# Patient Record
Sex: Female | Born: 1935 | Race: White | Hispanic: No | Smoking: Former smoker
Health system: Southern US, Community
[De-identification: ages and names within clinical notes are randomized; demographics above are authoritative.]

## PROBLEM LIST (undated history)

## (undated) DIAGNOSIS — I82409 Acute embolism and thrombosis of unspecified deep veins of unspecified lower extremity: Secondary | ICD-10-CM

## (undated) HISTORY — DX: Acute embolism and thrombosis of unspecified deep veins of unspecified lower extremity: I82.409

## (undated) HISTORY — PX: THROAT SURGERY: SHX803

---

## 1998-01-06 ENCOUNTER — Ambulatory Visit (HOSPITAL_COMMUNITY): Admission: RE | Admit: 1998-01-06 | Discharge: 1998-01-06 | Payer: Self-pay | Admitting: Internal Medicine

## 1999-01-20 ENCOUNTER — Encounter: Payer: Self-pay | Admitting: Internal Medicine

## 1999-01-20 ENCOUNTER — Ambulatory Visit (HOSPITAL_COMMUNITY): Admission: RE | Admit: 1999-01-20 | Discharge: 1999-01-20 | Payer: Self-pay | Admitting: Internal Medicine

## 2006-05-09 ENCOUNTER — Encounter: Admission: RE | Admit: 2006-05-09 | Discharge: 2006-05-09 | Payer: Self-pay | Admitting: Family Medicine

## 2006-05-17 ENCOUNTER — Encounter: Admission: RE | Admit: 2006-05-17 | Discharge: 2006-05-17 | Payer: Self-pay | Admitting: Family Medicine

## 2006-07-18 ENCOUNTER — Encounter: Admission: RE | Admit: 2006-07-18 | Discharge: 2006-07-18 | Payer: Self-pay | Admitting: Endocrinology

## 2006-07-22 ENCOUNTER — Encounter: Admission: RE | Admit: 2006-07-22 | Discharge: 2006-07-22 | Payer: Self-pay | Admitting: Endocrinology

## 2007-05-11 ENCOUNTER — Other Ambulatory Visit: Admission: RE | Admit: 2007-05-11 | Discharge: 2007-05-11 | Payer: Self-pay | Admitting: Family Medicine

## 2009-05-14 ENCOUNTER — Other Ambulatory Visit: Admission: RE | Admit: 2009-05-14 | Discharge: 2009-05-14 | Payer: Self-pay | Admitting: Family Medicine

## 2015-08-06 DIAGNOSIS — Z0000 Encounter for general adult medical examination without abnormal findings: Secondary | ICD-10-CM | POA: Diagnosis not present

## 2015-08-06 DIAGNOSIS — N183 Chronic kidney disease, stage 3 (moderate): Secondary | ICD-10-CM | POA: Diagnosis not present

## 2015-08-06 DIAGNOSIS — Z85828 Personal history of other malignant neoplasm of skin: Secondary | ICD-10-CM | POA: Diagnosis not present

## 2015-08-06 DIAGNOSIS — R7301 Impaired fasting glucose: Secondary | ICD-10-CM | POA: Diagnosis not present

## 2015-08-06 DIAGNOSIS — M810 Age-related osteoporosis without current pathological fracture: Secondary | ICD-10-CM | POA: Diagnosis not present

## 2015-08-06 DIAGNOSIS — Z23 Encounter for immunization: Secondary | ICD-10-CM | POA: Diagnosis not present

## 2015-08-06 DIAGNOSIS — D582 Other hemoglobinopathies: Secondary | ICD-10-CM | POA: Diagnosis not present

## 2015-08-06 DIAGNOSIS — E042 Nontoxic multinodular goiter: Secondary | ICD-10-CM | POA: Diagnosis not present

## 2015-10-06 DIAGNOSIS — Z961 Presence of intraocular lens: Secondary | ICD-10-CM | POA: Diagnosis not present

## 2016-01-05 DIAGNOSIS — M810 Age-related osteoporosis without current pathological fracture: Secondary | ICD-10-CM | POA: Diagnosis not present

## 2016-03-16 DIAGNOSIS — Z23 Encounter for immunization: Secondary | ICD-10-CM | POA: Diagnosis not present

## 2016-04-09 DIAGNOSIS — S0501XA Injury of conjunctiva and corneal abrasion without foreign body, right eye, initial encounter: Secondary | ICD-10-CM | POA: Diagnosis not present

## 2016-07-06 DIAGNOSIS — E559 Vitamin D deficiency, unspecified: Secondary | ICD-10-CM | POA: Diagnosis not present

## 2016-07-06 DIAGNOSIS — E0590 Thyrotoxicosis, unspecified without thyrotoxic crisis or storm: Secondary | ICD-10-CM | POA: Diagnosis not present

## 2016-07-16 DIAGNOSIS — D692 Other nonthrombocytopenic purpura: Secondary | ICD-10-CM | POA: Diagnosis not present

## 2016-07-16 DIAGNOSIS — D1801 Hemangioma of skin and subcutaneous tissue: Secondary | ICD-10-CM | POA: Diagnosis not present

## 2016-07-16 DIAGNOSIS — Z85828 Personal history of other malignant neoplasm of skin: Secondary | ICD-10-CM | POA: Diagnosis not present

## 2016-07-16 DIAGNOSIS — L821 Other seborrheic keratosis: Secondary | ICD-10-CM | POA: Diagnosis not present

## 2016-10-11 DIAGNOSIS — H5213 Myopia, bilateral: Secondary | ICD-10-CM | POA: Diagnosis not present

## 2016-10-18 DIAGNOSIS — E042 Nontoxic multinodular goiter: Secondary | ICD-10-CM | POA: Diagnosis not present

## 2016-10-18 DIAGNOSIS — N183 Chronic kidney disease, stage 3 (moderate): Secondary | ICD-10-CM | POA: Diagnosis not present

## 2016-10-18 DIAGNOSIS — M810 Age-related osteoporosis without current pathological fracture: Secondary | ICD-10-CM | POA: Diagnosis not present

## 2016-10-18 DIAGNOSIS — Z0000 Encounter for general adult medical examination without abnormal findings: Secondary | ICD-10-CM | POA: Diagnosis not present

## 2017-03-15 DIAGNOSIS — Z23 Encounter for immunization: Secondary | ICD-10-CM | POA: Diagnosis not present

## 2017-07-08 DIAGNOSIS — E0590 Thyrotoxicosis, unspecified without thyrotoxic crisis or storm: Secondary | ICD-10-CM | POA: Diagnosis not present

## 2017-07-08 DIAGNOSIS — E559 Vitamin D deficiency, unspecified: Secondary | ICD-10-CM | POA: Diagnosis not present

## 2017-07-14 ENCOUNTER — Encounter (HOSPITAL_COMMUNITY): Payer: Self-pay | Admitting: Emergency Medicine

## 2017-07-14 ENCOUNTER — Other Ambulatory Visit: Payer: Self-pay

## 2017-07-14 DIAGNOSIS — I82411 Acute embolism and thrombosis of right femoral vein: Secondary | ICD-10-CM | POA: Insufficient documentation

## 2017-07-14 DIAGNOSIS — Z79899 Other long term (current) drug therapy: Secondary | ICD-10-CM | POA: Insufficient documentation

## 2017-07-14 DIAGNOSIS — Z7982 Long term (current) use of aspirin: Secondary | ICD-10-CM | POA: Diagnosis not present

## 2017-07-14 DIAGNOSIS — M7989 Other specified soft tissue disorders: Secondary | ICD-10-CM | POA: Diagnosis not present

## 2017-07-14 NOTE — ED Triage Notes (Addendum)
Pt sent from Dr's office -- venous doppler positive for DVT in right femoral vein. Pt has had swelling in right leg for 3 weeks-- denies pain in leg-- has been walking at least 1 mile a day until it snowed.

## 2017-07-15 ENCOUNTER — Emergency Department (HOSPITAL_COMMUNITY)
Admission: EM | Admit: 2017-07-15 | Discharge: 2017-07-15 | Disposition: A | Discharge status: Home / Self Care | Payer: Medicare Other | Attending: Emergency Medicine | Admitting: Emergency Medicine

## 2017-07-15 DIAGNOSIS — I82411 Acute embolism and thrombosis of right femoral vein: Secondary | ICD-10-CM

## 2017-07-15 LAB — COMPREHENSIVE METABOLIC PANEL
ALBUMIN: 4.2 g/dL (ref 3.5–5.0)
ALK PHOS: 51 U/L (ref 38–126)
ALT: 14 U/L (ref 14–54)
ANION GAP: 14 (ref 5–15)
AST: 22 U/L (ref 15–41)
BILIRUBIN TOTAL: 1.2 mg/dL (ref 0.3–1.2)
BUN: 18 mg/dL (ref 6–20)
CALCIUM: 9.5 mg/dL (ref 8.9–10.3)
CO2: 21 mmol/L — ABNORMAL LOW (ref 22–32)
Chloride: 107 mmol/L (ref 101–111)
Creatinine, Ser: 1.1 mg/dL — ABNORMAL HIGH (ref 0.44–1.00)
GFR calc non Af Amer: 44 mL/min — ABNORMAL LOW (ref >60)
GFR, EST AFRICAN AMERICAN: 51 mL/min — AB (ref >60)
GLUCOSE: 106 mg/dL — AB (ref 65–99)
POTASSIUM: 4.5 mmol/L (ref 3.5–5.1)
Sodium: 142 mmol/L (ref 135–145)
TOTAL PROTEIN: 7 g/dL (ref 6.5–8.1)

## 2017-07-15 LAB — CBC WITH DIFFERENTIAL/PLATELET
BASOS PCT: 1 %
Basophils Absolute: 0.1 10*3/uL (ref 0.0–0.1)
EOS ABS: 0.1 10*3/uL (ref 0.0–0.7)
Eosinophils Relative: 1 %
HCT: 51.4 % — ABNORMAL HIGH (ref 36.0–46.0)
Hemoglobin: 17.1 g/dL — ABNORMAL HIGH (ref 12.0–15.0)
LYMPHS ABS: 1.5 10*3/uL (ref 0.7–4.0)
Lymphocytes Relative: 20 %
MCH: 29.7 pg (ref 26.0–34.0)
MCHC: 33.3 g/dL (ref 30.0–36.0)
MCV: 89.4 fL (ref 78.0–100.0)
MONO ABS: 0.9 10*3/uL (ref 0.1–1.0)
MONOS PCT: 12 %
Neutro Abs: 5.1 10*3/uL (ref 1.7–7.7)
Neutrophils Relative %: 66 %
Platelets: 182 10*3/uL (ref 150–400)
RBC: 5.75 MIL/uL — ABNORMAL HIGH (ref 3.87–5.11)
RDW: 14.3 % (ref 11.5–15.5)
WBC: 7.6 10*3/uL (ref 4.0–10.5)

## 2017-07-15 MED ORDER — RIVAROXABAN (XARELTO) VTE STARTER PACK (15 & 20 MG): 15.0000 mg | ORAL_TABLET | Freq: Two times a day (BID) | ORAL | 0 refills | Status: DC

## 2017-07-15 MED ORDER — RIVAROXABAN 15 MG PO TABS
15.0000 mg | ORAL_TABLET | Freq: Two times a day (BID) | ORAL | Status: DC
  Administered 2017-07-15: 15 mg via ORAL
  Filled 2017-07-15 (×2): qty 1

## 2017-07-15 NOTE — Progress Notes (Signed)
ANTICOAGULATION CONSULT NOTE - Initial Consult  Pharmacy Consult for Xarelto Indication: DVT  No Known Allergies  Patient Measurements: Height: 5\' 4"  (162.6 cm) Weight: 134 lb (60.8 kg) IBW/kg (Calculated) : 54.7  Vital Signs: Temp: 97.5 F (36.4 C) (02/08 0849) Temp Source: Oral (02/08 0849) BP: 150/77 (02/08 0849) Pulse Rate: 65 (02/08 0849)  Labs: Recent Labs    07/15/17 0812  HGB 17.1*  HCT 51.4*  PLT 182  CREATININE 1.10*    Estimated Creatinine Clearance: 31.1 mL/min (A) (by C-G formula based on SCr of 1.1 mg/dL (H)).   Medical History: History reviewed. No pertinent past medical history.  Medications:  Scheduled:  . Rivaroxaban  15 mg Oral BID WC    Assessment: 75 yof arriving in ED from office with positive venous doppler showing DVT in R femoral vein. She presents with swelling in R leg for 3 weeks. Not on anticoagulation prior to admission.  Scr is 1.1 (CrCl on TBW 34.58 mL/min). Hgb 17.1, platelets are within normal limits. No signs/symptoms of bleeding.   Goal of Therapy:  Monitor platelets by anticoagulation protocol: Yes   Plan:  Xarelto 15 mg twice daily with food for 21 days then 20 mg daily. Recommend follow-up BMET check to ensure TBW CrCl>30 mL/min  Doylene Canard, PharmD Clinical Pharmacist  Pager: [phone] Clinical Phone for 07/15/2017 until 3:30pm: [phone] If after 3:30pm, please call main pharmacy at [phone] 07/15/2017,9:38 AM

## 2017-07-15 NOTE — ED Provider Notes (Signed)
[facility] EMERGENCY DEPARTMENT Provider Note   CSN: [id number] Arrival date & time: 07/14/17  1724     History   Chief Complaint Chief Complaint  Patient presents with  . DVT  . sent from dr's    HPI Hannah Stein is a 84 y.o. female here for evaluation of right leg DVT. Was seen at primary care office for right leg swelling the last 3-4 weeks, had ultrasound that confirmed a DVT and was told to come to the emergency department for further treatment. Patient denies pain, warmth, numbness, tingling or weakness/heaviness to the extremity. No chest pain, shortness of breath, palpitations, presyncope, trauma to the extremity. Denies previous history of DVT/PE. No history of major surgery, hospitalization, and mobility, estrogen therapy, malignancy, bleeding or clotting disorders. States for the last several year she has walked 1 mile a day but she stopped doing this this winter due to snow and cold for the first time. No ETOH use. Ambulatory independently and able to do ADLs without problems. No recent falls.   HPI  History reviewed. No pertinent past medical history.  There are no active problems to display for this patient.   History reviewed. No pertinent surgical history.  OB History    No data available       Home Medications    Prior to Admission medications   Medication Sig Start Date End Date Taking? Authorizing Provider  aspirin 81 MG EC tablet Take 81 mg by mouth daily.   Yes [provider]  b complex vitamins tablet Take 1 tablet by mouth daily.   Yes [provider]  calcium carbonate (TUMS - DOSED IN MG ELEMENTAL CALCIUM) 500 MG chewable tablet Chew 1-2 tablets by mouth daily.   Yes [provider]  Cholecalciferol (VITAMIN D-3 PO) Take 1 capsule by mouth daily.   Yes [provider]  Glucosamine HCl (GLUCOSAMINE PO) Take 1 tablet by mouth daily.   Yes [provider]  VITAMIN E PO Take 1 capsule  by mouth daily.   Yes [provider]  Rivaroxaban 15 & 20 MG TBPK Take 15 mg by mouth 2 (two) times daily. Take as directed on package: Start with one 15mg  tablet by mouth twice a day with food. On Day 22, switch to one 20mg  tablet once a day with food. 07/15/17   Kinnie Feil, PA-C    Family History No family history on file.  Social History Social History   Tobacco Use  . Smoking status: Not on file  Substance Use Topics  . Alcohol use: Not on file  . Drug use: Not on file     Allergies   Patient has no known allergies.   Review of Systems Review of Systems  Cardiovascular: Positive for leg swelling.  Skin: Positive for color change.  All other systems reviewed and are negative.    Physical Exam Updated Vital Signs BP (!) 150/77 (BP Location: Right Arm)   Pulse 65   Temp (!) 97.5 F (36.4 C) (Oral)   Resp 18   Ht 5\' 4"  (1.626 m)   Wt 60.8 kg (134 lb)   SpO2 97%   BMI 23.00 kg/m   Physical Exam  Constitutional: She is oriented to person, place, and time. She appears well-developed and well-nourished. No distress.  NAD. Good historian.   HENT:  Head: Normocephalic and atraumatic.  Right Ear: External ear normal.  Left Ear: External ear normal.  Nose: Nose normal.  Eyes:  Conjunctivae and EOM are normal. No scleral icterus.  Neck: Normal range of motion. Neck supple.  Cardiovascular: Normal rate, regular rhythm, S1 normal, S2 normal and normal heart sounds.  No murmur heard. Pulses:      Carotid pulses are 2+ on the right side, and 2+ on the left side.      Radial pulses are 2+ on the right side, and 2+ on the left side.       Popliteal pulses are 2+ on the right side, and 2+ on the left side.       Dorsalis pedis pulses are 2+ on the right side, and 2+ on the left side.  2+ pitting edema to right leg from knee to ankle. No calf, popliteal or upper leg tenderness. One small vericose vein to right calf. See picture.   Pulmonary/Chest: Effort  normal and breath sounds normal. She has no wheezes.  Abdominal: Soft. There is no tenderness.  Musculoskeletal: Normal range of motion. She exhibits no deformity.  Neurological: She is alert and oriented to person, place, and time.  5/5 strength with ROM at hip, knee, ankle bilaterally.  Sensation to light touch intact lower extremities.  Skin: Skin is warm and dry. Capillary refill takes less than 2 seconds. There is erythema.  Mild erythema without warmth to right lower leg  Psychiatric: She has a normal mood and affect. Her behavior is normal. Judgment and thought content normal.  Nursing note and vitals reviewed.      ED Treatments / Results  Labs (all labs ordered are listed, but only abnormal results are displayed) Labs Reviewed  CBC WITH DIFFERENTIAL/PLATELET - Abnormal; Notable for the following components:      Result Value   RBC 5.75 (*)    Hemoglobin 17.1 (*)    HCT 51.4 (*)    All other components within normal limits  COMPREHENSIVE METABOLIC PANEL - Abnormal; Notable for the following components:   CO2 21 (*)    Glucose, Bld 106 (*)    Creatinine, Ser 1.10 (*)    GFR calc non Af Amer 44 (*)    GFR calc Af Amer 51 (*)    All other components within normal limits    EKG  EKG Interpretation None       Radiology No results found.  Procedures Procedures (including critical care time)  Medications Ordered in ED Medications  Rivaroxaban (XARELTO) tablet 15 mg (not administered)     Initial Impression / Assessment and Plan / ED Course  I have reviewed the triage vital signs and the nursing notes.  Pertinent labs & imaging results that were available during my care of the patient were reviewed by me and considered in my medical decision making (see chart for details).    Exam consistent with DVT. Extremity is NVI. Recent stop of exercise may have contributing. Otherwise no other risk factors for DVT. She has no signs of PE today. Will attempt to put  outside facility [place] on our system. Impression reads "nonocclussive thrombus noted in the common femoral vein and proximal femoral". Pt at low risk for bleeding on anticoagulants given age >13, however has no h/o previous bleeding, cancer, renal/liver disease, thrombocytopenia, anemia, reduced functional capacity, recent surgery or hemorrhage, frequent falls or ETOH abuse. At this time I think anticoagulants benefit is greater than risk. Will obtain screening labs.   Final Clinical Impressions(s) / ED Diagnoses   Creatinine/GFR within acceptable range to start xarelto. Consultative pharmacy who assisted in dosage and  patient education. I spoke to Dr. Gwenlyn Saran with vascular surgery who agrees with the management and recommends follow-up in clinic in the next 1-2 weeks. Patient shared with supervising physician. Final diagnoses:  Acute deep vein thrombosis (DVT) of femoral vein of right lower extremity [facility])    ED Discharge Orders        Ordered    Rivaroxaban 15 & 20 MG TBPK  2 times daily     07/15/17 1020       Kinnie Feil, [place] 07/15/17 1024    Malvin Johns, MD 07/15/17 1335

## 2017-07-15 NOTE — ED Notes (Signed)
Called pharmacy to send [place].

## 2017-07-15 NOTE — Discharge Instructions (Addendum)
You have a blood clot of your femoral vein.   Take xarelto as prescribed by pharmacist. See attached information on xarelto.   Follow up with vascular surgery (Dr Donzetta Matters) within 1-2 weeks for re-evaluation. You will need to get your kidney function retested then.     Information on my medicine - XARELTO (rivaroxaban)  This medication education was reviewed with me or my healthcare representative as part of my discharge preparation.  The pharmacist that spoke with me during my hospital stay was:  Betsey Holiday, [place]? Xarelto was prescribed to treat blood clots that may have been found in the veins of your legs (deep vein thrombosis) or in your lungs (pulmonary embolism) and to reduce the risk of them occurring again.  WHAT DO YOU NEED TO KNOW ABOUT XARELTO? The starting dose is one 15 mg tablet taken TWICE daily with food for the FIRST 21 DAYS then the dose is changed to one 20 mg tablet taken ONCE A DAY with your evening meal.  DO NOT stop taking Xarelto without talking to the health care provider who prescribed the medication.  Refill your prescription for 20 mg tablets before you run out.  After discharge, you should have regular check-up appointments with your healthcare provider that is prescribing your Xarelto.  In the future your dose may need to be changed if your kidney function changes by a significant amount.  WHAT DO YOU DO IF YOU MISS A DOSE? If you are taking Xarelto TWICE DAILY and you miss a dose, take it as soon as you remember. You may take two 15 mg tablets (total 30 mg) at the same time then resume your regularly scheduled 15 mg twice daily the next day.  If you are taking Xarelto ONCE DAILY and you miss a dose, take it as soon as you remember on the same day then continue your regularly scheduled once daily regimen the next day. Do not take two doses of Xarelto at the same time.   IMPORTANT SAFETY INFORMATION Xarelto is  a blood thinner medicine that can cause bleeding. You should call your healthcare provider right away if you experience any of the following: Bleeding from an injury or your nose that does not stop. Unusual colored urine (red or dark brown) or unusual colored stools (red or black). Unusual bruising for unknown reasons. A serious fall or if you hit your head (even if there is no bleeding).  Some medicines may interact with Xarelto and might increase your risk of bleeding while on Xarelto. To help avoid this, consult your healthcare provider or pharmacist prior to using any new prescription or non-prescription medications, including herbals, vitamins, non-steroidal anti-inflammatory drugs (NSAIDs) and supplements.  This website has more information on Xarelto: [email or web address]

## 2017-07-19 DIAGNOSIS — D692 Other nonthrombocytopenic purpura: Secondary | ICD-10-CM | POA: Diagnosis not present

## 2017-07-19 DIAGNOSIS — Z85828 Personal history of other malignant neoplasm of skin: Secondary | ICD-10-CM | POA: Diagnosis not present

## 2017-07-19 DIAGNOSIS — L570 Actinic keratosis: Secondary | ICD-10-CM | POA: Diagnosis not present

## 2017-07-19 DIAGNOSIS — D225 Melanocytic nevi of trunk: Secondary | ICD-10-CM | POA: Diagnosis not present

## 2017-07-22 ENCOUNTER — Ambulatory Visit: Payer: Medicare Other | Admitting: Vascular Surgery

## 2017-07-22 ENCOUNTER — Encounter: Payer: Self-pay | Admitting: Vascular Surgery

## 2017-07-22 ENCOUNTER — Other Ambulatory Visit: Payer: Self-pay

## 2017-07-22 VITALS — BP 149/84 | HR 63 | Temp 97.5°F | Resp 14 | Ht 64.0 in | Wt 132.0 lb

## 2017-07-22 DIAGNOSIS — I82411 Acute embolism and thrombosis of right femoral vein: Secondary | ICD-10-CM

## 2017-07-22 NOTE — Progress Notes (Signed)
Patient ID: Hannah Stein, female   DOB: [birth year], 84 y.o.   MRN: [id number]  Reason for Consult: Follow-up (DVT right leg)   Referred by Hannah Pillar, MD  Subjective:     HPI:  Hannah Stein is a 84 y.o. female with without history of DVT or family history of DVT.  She does not have any history of cancer although she has had a throat surgery in the past.  She recently had extensive swelling of her right lower extremity was sent for ultrasound and was found to have large DVT partially occlusive in the right common femoral vein and the femoral vein.  She was placed on Xarelto 1 week ago for that.  Since that time she has noted some improvement in her swelling.  She does not wear compression stockings.  She does remain very active and she walks frequently.  She does not have any cellulitis or ulceration of her right lower extremity.  She is not have any varicose veins or telangiectasias.  Past Medical History:  Diagnosis Date  . DVT, lower extremity ([place])    History reviewed. No pertinent family history. Past Surgical History:  Procedure Laterality Date  . THROAT SURGERY      Short Social History:  Social History   Tobacco Use  . Smoking status: Former Smoker    Types: Cigarettes    Last attempt to quit: 07/22/1982    Years since quitting: 35.0  . Smokeless tobacco: Never Used  Substance Use Topics  . Alcohol use: No    Frequency: Never    No Known Allergies  Current Outpatient Medications  Medication Sig Dispense Refill  . b complex vitamins tablet Take 1 tablet by mouth daily.    . calcium carbonate (TUMS - DOSED IN MG ELEMENTAL CALCIUM) 500 MG chewable tablet Chew 1-2 tablets by mouth daily.    . Cholecalciferol (VITAMIN D-3 PO) Take 1 capsule by mouth daily.    . Glucosamine HCl (GLUCOSAMINE PO) Take 1 tablet by mouth daily.    . Rivaroxaban 15 & 20 MG TBPK Take 15 mg by mouth 2 (two) times daily. Take as directed on package: Start with one 15mg  tablet by  mouth twice a day with food. On Day 22, switch to one 20mg  tablet once a day with food. 51 each 0  . VITAMIN E PO Take 1 capsule by mouth daily.    Marland Kitchen aspirin 81 MG EC tablet Take 81 mg by mouth daily.     No current facility-administered medications for this visit.     Review of Systems  Constitutional:  Constitutional negative. HENT: HENT negative.  Eyes: Eyes negative.  Respiratory: Respiratory negative.  Cardiovascular: Positive for leg swelling.  GI: Gastrointestinal negative.  Musculoskeletal: Musculoskeletal negative.  Skin: Skin negative.  Neurological: Neurological negative. Hematologic: Hematologic/lymphatic negative.  Psychiatric: Psychiatric negative.        Objective:  Objective   Vitals:   07/22/17 1526 07/22/17 1531  BP: (!) 141/74 (!) 149/84  Pulse: 63 63  Resp: 14   Temp: (!) 97.5 F (36.4 C)   TempSrc: Oral   SpO2: 93%   Weight: 132 lb (59.9 kg)   Height: 5\' 4"  (1.626 m)    Body mass index is 22.66 kg/m.  Physical Exam  HENT:  Head: Normocephalic.  Eyes: Pupils are equal, round, and reactive to light.  Neck: Normal range of motion.  Cardiovascular: Normal rate.  Pulses:      Radial pulses are 2+ on  the right side, and 2+ on the left side.       Femoral pulses are 2+ on the right side, and 2+ on the left side.      Popliteal pulses are 2+ on the right side, and 2+ on the left side.  Pulmonary/Chest: Effort normal.  Abdominal: Soft. She exhibits no mass.  Musculoskeletal: Normal range of motion. She exhibits edema.  Neurological: She is alert.  Skin: Skin is warm and dry.  Psychiatric: She has a normal mood and affect. Her behavior is normal. Judgment and thought content normal.    Data: Outside report of duplex demonstrating nonocclusive right common femoral and femoral vein DVT.  IVC and right iliac veins were patent.     Assessment/Plan:     84 year old female with recent history of extensive right lower extremity DVT now on Xarelto.   Her swelling appears to be improving.  I recommended her to wear at least knee-high compression stockings as compressive as she can tolerate and get on by herself.  She also needs to walk as much as possible.  Given the extent of the DVT I would recommend at least 6 months of anticoagulation although she is somewhat of a fall risk and if she has falls this may need to be shortened.  When she is no longer taking Xarelto she should be on baby aspirin at least daily.  She can follow-up here on a as needed basis.     Hannah Sandy MD [facility]

## 2017-07-22 NOTE — Progress Notes (Signed)
Vitals:   07/22/17 1526  BP: (!) 141/74  Pulse: 63  Resp: 14  Temp: (!) 97.5 F (36.4 C)  TempSrc: Oral  SpO2: 93%  Weight: 132 lb (59.9 kg)  Height: 5\' 4"  (1.626 m)

## 2017-10-17 DIAGNOSIS — Z961 Presence of intraocular lens: Secondary | ICD-10-CM | POA: Diagnosis not present

## 2017-10-17 DIAGNOSIS — H5213 Myopia, bilateral: Secondary | ICD-10-CM | POA: Diagnosis not present

## 2017-10-27 DIAGNOSIS — R05 Cough: Secondary | ICD-10-CM | POA: Diagnosis not present

## 2017-10-27 DIAGNOSIS — R0989 Other specified symptoms and signs involving the circulatory and respiratory systems: Secondary | ICD-10-CM | POA: Diagnosis not present

## 2017-11-11 DIAGNOSIS — J209 Acute bronchitis, unspecified: Secondary | ICD-10-CM | POA: Diagnosis not present

## 2017-11-23 DIAGNOSIS — E042 Nontoxic multinodular goiter: Secondary | ICD-10-CM | POA: Diagnosis not present

## 2017-11-23 DIAGNOSIS — N183 Chronic kidney disease, stage 3 (moderate): Secondary | ICD-10-CM | POA: Diagnosis not present

## 2017-11-23 DIAGNOSIS — Z0000 Encounter for general adult medical examination without abnormal findings: Secondary | ICD-10-CM | POA: Diagnosis not present

## 2017-11-23 DIAGNOSIS — M810 Age-related osteoporosis without current pathological fracture: Secondary | ICD-10-CM | POA: Diagnosis not present

## 2018-03-24 DIAGNOSIS — Z23 Encounter for immunization: Secondary | ICD-10-CM | POA: Diagnosis not present

## 2018-07-25 DIAGNOSIS — Z85828 Personal history of other malignant neoplasm of skin: Secondary | ICD-10-CM | POA: Diagnosis not present

## 2018-07-25 DIAGNOSIS — D225 Melanocytic nevi of trunk: Secondary | ICD-10-CM | POA: Diagnosis not present

## 2018-07-25 DIAGNOSIS — L853 Xerosis cutis: Secondary | ICD-10-CM | POA: Diagnosis not present

## 2018-07-25 DIAGNOSIS — L570 Actinic keratosis: Secondary | ICD-10-CM | POA: Diagnosis not present

## 2018-08-07 ENCOUNTER — Other Ambulatory Visit: Payer: Self-pay

## 2018-08-07 ENCOUNTER — Inpatient Hospital Stay (HOSPITAL_COMMUNITY)
Admission: EM | Admit: 2018-08-07 | Discharge: 2018-08-11 | DRG: 190 | Disposition: A | Discharge status: Home Health | Payer: Medicare Other | Attending: Internal Medicine | Admitting: Internal Medicine

## 2018-08-07 ENCOUNTER — Encounter (HOSPITAL_COMMUNITY): Payer: Self-pay | Admitting: Emergency Medicine

## 2018-08-07 ENCOUNTER — Emergency Department (HOSPITAL_COMMUNITY): Payer: Medicare Other

## 2018-08-07 DIAGNOSIS — Z86718 Personal history of other venous thrombosis and embolism: Secondary | ICD-10-CM | POA: Diagnosis not present

## 2018-08-07 DIAGNOSIS — Z8249 Family history of ischemic heart disease and other diseases of the circulatory system: Secondary | ICD-10-CM

## 2018-08-07 DIAGNOSIS — J441 Chronic obstructive pulmonary disease with (acute) exacerbation: Secondary | ICD-10-CM | POA: Diagnosis not present

## 2018-08-07 DIAGNOSIS — D72829 Elevated white blood cell count, unspecified: Secondary | ICD-10-CM | POA: Diagnosis not present

## 2018-08-07 DIAGNOSIS — J209 Acute bronchitis, unspecified: Secondary | ICD-10-CM | POA: Diagnosis not present

## 2018-08-07 DIAGNOSIS — R0602 Shortness of breath: Secondary | ICD-10-CM | POA: Diagnosis not present

## 2018-08-07 DIAGNOSIS — Z79899 Other long term (current) drug therapy: Secondary | ICD-10-CM | POA: Diagnosis not present

## 2018-08-07 DIAGNOSIS — N183 Chronic kidney disease, stage 3 (moderate): Secondary | ICD-10-CM | POA: Diagnosis present

## 2018-08-07 DIAGNOSIS — J9601 Acute respiratory failure with hypoxia: Secondary | ICD-10-CM | POA: Diagnosis present

## 2018-08-07 DIAGNOSIS — Z87891 Personal history of nicotine dependence: Secondary | ICD-10-CM | POA: Diagnosis not present

## 2018-08-07 DIAGNOSIS — J440 Chronic obstructive pulmonary disease with acute lower respiratory infection: Secondary | ICD-10-CM | POA: Diagnosis not present

## 2018-08-07 DIAGNOSIS — I4891 Unspecified atrial fibrillation: Secondary | ICD-10-CM | POA: Diagnosis not present

## 2018-08-07 DIAGNOSIS — R0902 Hypoxemia: Secondary | ICD-10-CM | POA: Diagnosis not present

## 2018-08-07 DIAGNOSIS — I272 Pulmonary hypertension, unspecified: Secondary | ICD-10-CM | POA: Diagnosis present

## 2018-08-07 DIAGNOSIS — I361 Nonrheumatic tricuspid (valve) insufficiency: Secondary | ICD-10-CM | POA: Diagnosis not present

## 2018-08-07 DIAGNOSIS — J80 Acute respiratory distress syndrome: Secondary | ICD-10-CM | POA: Diagnosis not present

## 2018-08-07 DIAGNOSIS — R0600 Dyspnea, unspecified: Secondary | ICD-10-CM | POA: Diagnosis not present

## 2018-08-07 LAB — COMPREHENSIVE METABOLIC PANEL
ALT: 36 U/L (ref 0–44)
ANION GAP: 12 (ref 5–15)
AST: 47 U/L — ABNORMAL HIGH (ref 15–41)
Albumin: 3.4 g/dL — ABNORMAL LOW (ref 3.5–5.0)
Alkaline Phosphatase: 95 U/L (ref 38–126)
BUN: 24 mg/dL — ABNORMAL HIGH (ref 8–23)
CO2: 24 mmol/L (ref 22–32)
Calcium: 9.5 mg/dL (ref 8.9–10.3)
Chloride: 107 mmol/L (ref 98–111)
Creatinine, Ser: 1.28 mg/dL — ABNORMAL HIGH (ref 0.44–1.00)
GFR calc non Af Amer: 37 mL/min — ABNORMAL LOW (ref >60)
GFR, EST AFRICAN AMERICAN: 43 mL/min — AB (ref >60)
GLUCOSE: 165 mg/dL — AB (ref 70–99)
POTASSIUM: 3.9 mmol/L (ref 3.5–5.1)
SODIUM: 143 mmol/L (ref 135–145)
TOTAL PROTEIN: 7.3 g/dL (ref 6.5–8.1)
Total Bilirubin: 0.7 mg/dL (ref 0.3–1.2)

## 2018-08-07 LAB — CBC WITH DIFFERENTIAL/PLATELET
ABS IMMATURE GRANULOCYTES: 0.06 10*3/uL (ref 0.00–0.07)
BASOS ABS: 0.1 10*3/uL (ref 0.0–0.1)
BASOS PCT: 0 %
Eosinophils Absolute: 0 10*3/uL (ref 0.0–0.5)
Eosinophils Relative: 0 %
HCT: 53.6 % — ABNORMAL HIGH (ref 36.0–46.0)
Hemoglobin: 16.3 g/dL — ABNORMAL HIGH (ref 12.0–15.0)
IMMATURE GRANULOCYTES: 1 %
Lymphocytes Relative: 7 %
Lymphs Abs: 0.8 10*3/uL (ref 0.7–4.0)
MCH: 27.8 pg (ref 26.0–34.0)
MCHC: 30.4 g/dL (ref 30.0–36.0)
MCV: 91.3 fL (ref 80.0–100.0)
MONOS PCT: 10 %
Monocytes Absolute: 1.2 10*3/uL — ABNORMAL HIGH (ref 0.1–1.0)
NEUTROS ABS: 9.9 10*3/uL — AB (ref 1.7–7.7)
NEUTROS PCT: 82 %
NRBC: 0 % (ref 0.0–0.2)
Platelets: 209 10*3/uL (ref 150–400)
RBC: 5.87 MIL/uL — ABNORMAL HIGH (ref 3.87–5.11)
RDW: 13.1 % (ref 11.5–15.5)
WBC: 12 10*3/uL — ABNORMAL HIGH (ref 4.0–10.5)

## 2018-08-07 MED ORDER — IPRATROPIUM-ALBUTEROL 0.5-2.5 (3) MG/3ML IN SOLN
3.0000 mL | Freq: Once | RESPIRATORY_TRACT | Status: AC
  Administered 2018-08-07: 3 mL via RESPIRATORY_TRACT
  Filled 2018-08-07: qty 3

## 2018-08-07 MED ORDER — METOPROLOL TARTRATE 5 MG/5ML IV SOLN
5.0000 mg | Freq: Once | INTRAVENOUS | Status: AC
Start: 2018-08-07 — End: 2018-08-07
  Administered 2018-08-07: 5 mg via INTRAVENOUS
  Filled 2018-08-07: qty 5

## 2018-08-07 MED ORDER — LACTATED RINGERS IV BOLUS
1000.0000 mL | Freq: Once | INTRAVENOUS | Status: AC
  Administered 2018-08-07: 1000 mL via INTRAVENOUS

## 2018-08-07 MED ORDER — SODIUM CHLORIDE 0.9 % IV SOLN
1.0000 g | INTRAVENOUS | Status: DC
  Administered 2018-08-08 – 2018-08-10 (×4): 1 g via INTRAVENOUS
  Filled 2018-08-07 (×4): qty 10

## 2018-08-07 MED ORDER — DILTIAZEM HCL-DEXTROSE 100-5 MG/100ML-% IV SOLN (PREMIX)
5.0000 mg/h | INTRAVENOUS | Status: DC
  Administered 2018-08-07: 5 mg/h via INTRAVENOUS
  Filled 2018-08-07: qty 100

## 2018-08-07 MED ORDER — PREDNISONE 20 MG PO TABS
40.0000 mg | ORAL_TABLET | Freq: Every day | ORAL | Status: DC
  Administered 2018-08-08: 40 mg via ORAL
  Filled 2018-08-07: qty 2

## 2018-08-07 MED ORDER — BENZONATATE 100 MG PO CAPS
200.0000 mg | ORAL_CAPSULE | Freq: Three times a day (TID) | ORAL | Status: DC | PRN
Start: 2018-08-07 — End: 2018-08-11
  Administered 2018-08-08 – 2018-08-09 (×2): 200 mg via ORAL
  Filled 2018-08-07 (×2): qty 2

## 2018-08-07 MED ORDER — HEPARIN (PORCINE) 25000 UT/250ML-% IV SOLN
950.0000 [IU]/h | INTRAVENOUS | Status: AC
  Administered 2018-08-07 – 2018-08-09 (×2): 800 [IU]/h via INTRAVENOUS
  Filled 2018-08-07 (×2): qty 250

## 2018-08-07 MED ORDER — DILTIAZEM HCL 25 MG/5ML IV SOLN
15.0000 mg | Freq: Once | INTRAVENOUS | Status: AC
  Administered 2018-08-07: 15 mg via INTRAVENOUS
  Filled 2018-08-07: qty 5

## 2018-08-07 MED ORDER — UMECLIDINIUM BROMIDE 62.5 MCG/INH IN AEPB: 1.0000 | INHALATION_SPRAY | Freq: Every day | RESPIRATORY_TRACT | Status: DC

## 2018-08-07 MED ORDER — ENOXAPARIN SODIUM 40 MG/0.4ML ~~LOC~~ SOLN: 40.0000 mg | SUBCUTANEOUS | Status: DC

## 2018-08-07 MED ORDER — LEVALBUTEROL HCL 0.63 MG/3ML IN NEBU: 0.6300 mg | INHALATION_SOLUTION | Freq: Four times a day (QID) | RESPIRATORY_TRACT | Status: DC | PRN

## 2018-08-07 MED ORDER — ALBUTEROL (5 MG/ML) CONTINUOUS INHALATION SOLN
10.0000 mg/h | INHALATION_SOLUTION | Freq: Once | RESPIRATORY_TRACT | Status: AC
  Administered 2018-08-07: 10 mg/h via RESPIRATORY_TRACT
  Filled 2018-08-07: qty 20

## 2018-08-07 MED ORDER — BUDESONIDE 0.5 MG/2ML IN SUSP
2.0000 mg | Freq: Four times a day (QID) | RESPIRATORY_TRACT | Status: DC
  Administered 2018-08-08 (×2): 0.5 mg via RESPIRATORY_TRACT
  Filled 2018-08-07 (×2): qty 8

## 2018-08-07 MED ORDER — HEPARIN BOLUS VIA INFUSION
2900.0000 [IU] | Freq: Once | INTRAVENOUS | Status: AC
  Administered 2018-08-07: 2900 [IU] via INTRAVENOUS
  Filled 2018-08-07: qty 2900

## 2018-08-07 NOTE — H&P (Signed)
History and Physical    LAURENASHLEY Stein KVQ:[id number] DOB: 05/07/1929 DOA: 08/07/2018  PCP: Kelton Pillar, MD  Patient coming from: Home  I have personally briefly reviewed patient's old medical records in [place]  Chief Complaint: SOB  HPI: Hannah Stein is a 85 y.o. female with medical history significant of DVT last year.  Patient presents to the ED with 5 day history of worsening cough, congestion, wheezing.  Started with URI symptoms 5 days ago, progressed to LRI symptoms the last couple days.  Patient lives at home (not in SNF), no sick contacts, no travel history.  No fevers nor chills.   ED Course: EMS gave solumedrol and albuterol treatments.  Then patient got another 12.5mg  albuterol in neb treatments in ED.  Patient was NSR initially, but has converted into A.Fib RVR (new onset) with HR 150s-190s.  15 of cardizem and she is still in the 150s.   Review of Systems: As per HPI otherwise 10 point review of systems negative.   Past Medical History:  Diagnosis Date  . DVT, lower extremity [facility])     Past Surgical History:  Procedure Laterality Date  . THROAT SURGERY       reports that she quit smoking about 36 years ago. Her smoking use included cigarettes. She has never used smokeless tobacco. She reports that she does not drink alcohol or use drugs.  No Known Allergies  No family history on file.   Prior to Admission medications   Medication Sig Start Date End Date Taking? Authorizing Provider  acetaminophen (TYLENOL) 325 MG tablet Take 325 mg by mouth every 6 (six) hours as needed for headache (pain).   Yes [provider]  calcium carbonate (TUMS - DOSED IN MG ELEMENTAL CALCIUM) 500 MG chewable tablet Chew 2 tablets by mouth daily after supper.    Yes [provider]  Dextromethorphan-guaiFENesin (ROBITUSSIN DM PO) Take 5 mLs by mouth 2 (two) times daily as needed (cough).   Yes [provider]  Glucosamine-Chondroitin  (GLUCOSAMINE CHONDR COMPLEX PO) Take 1 tablet by mouth daily.   Yes [provider]  Throat Lozenges (COUGH DROPS MT) Use as directed 1 lozenge in the mouth or throat 2 (two) times daily as needed (cough).   Yes [provider]  vitamin B-12 (CYANOCOBALAMIN) 500 MCG tablet Take 500 mcg by mouth daily.   Yes [provider]  Vitamin D, Cholecalciferol, 50 MCG (2000 UT) CAPS Take 2,000 Units by mouth daily.   Yes [provider]  vitamin E 400 UNIT capsule Take 400 Units by mouth daily.    Yes [provider]  Rivaroxaban 15 & 20 MG TBPK Take 15 mg by mouth 2 (two) times daily. Take as directed on package: Start with one 15mg  tablet by mouth twice a day with food. On Day 22, switch to one 20mg  tablet once a day with food. Patient not taking: Reported on 08/07/2018 07/15/17   Kinnie Feil, [place]    Physical Exam: Vitals:   08/07/18 1930 08/07/18 1945 08/07/18 2017 08/07/18 2126  BP: (!) 116/56 (!) 155/91 (!) 123/48 129/86  Pulse: 100 (!) 103 (!) 142 (!) 153  Resp: (!) 28 (!) 26 (!) 24 (!) 21  Temp:      TempSrc:      SpO2: 96% 94% 94% 95%    Constitutional: NAD, calm, comfortable Eyes: PERRL, lids and conjunctivae normal ENMT: Mucous membranes are moist. Posterior pharynx clear of any exudate or lesions.Normal dentition.  Neck: normal, supple, no masses, no thyromegaly Respiratory: clear to auscultation bilaterally, no wheezing, no crackles. Normal respiratory effort. No accessory muscle use.  Cardiovascular: Regular rate and rhythm, no murmurs / rubs / gallops. No extremity edema. 2+ pedal pulses. No carotid bruits.  Abdomen: no tenderness, no masses palpated. No hepatosplenomegaly. Bowel sounds positive.  Musculoskeletal: no clubbing / cyanosis. No joint deformity upper and lower extremities. Good ROM, no contractures. Normal muscle tone.  Skin: no rashes, lesions, ulcers. No induration Neurologic: CN 2-12 grossly intact. Sensation intact, DTR  normal. Strength 5/5 in all 4.  Psychiatric: Normal judgment and insight. Alert and oriented x 3. Normal mood.    Labs on Admission: I have personally reviewed following labs and imaging studies  CBC: Recent Labs  Lab 08/07/18 1736  WBC 12.0*  NEUTROABS 9.9*  HGB 16.3*  HCT 53.6*  MCV 91.3  PLT 329   Basic Metabolic Panel: Recent Labs  Lab 08/07/18 1736  NA 143  K 3.9  CL 107  CO2 24  GLUCOSE 165*  BUN 24*  CREATININE 1.28*  CALCIUM 9.5   GFR: CrCl cannot be calculated (Unknown ideal weight.). Liver Function Tests: Recent Labs  Lab 08/07/18 1736  AST 47*  ALT 36  ALKPHOS 95  BILITOT 0.7  PROT 7.3  ALBUMIN 3.4*   No results for input(s): LIPASE, AMYLASE in the last 168 hours. No results for input(s): AMMONIA in the last 168 hours. Coagulation Profile: No results for input(s): INR, PROTIME in the last 168 hours. Cardiac Enzymes: No results for input(s): CKTOTAL, CKMB, CKMBINDEX, TROPONINI in the last 168 hours. BNP (last 3 results) No results for input(s): PROBNP in the last 8760 hours. HbA1C: No results for input(s): HGBA1C in the last 72 hours. CBG: No results for input(s): GLUCAP in the last 168 hours. Lipid Profile: No results for input(s): CHOL, HDL, LDLCALC, TRIG, CHOLHDL, LDLDIRECT in the last 72 hours. Thyroid Function Tests: No results for input(s): TSH, T4TOTAL, FREET4, T3FREE, THYROIDAB in the last 72 hours. Anemia Panel: No results for input(s): VITAMINB12, FOLATE, FERRITIN, TIBC, IRON, RETICCTPCT in the last 72 hours. Urine analysis: No results found for: COLORURINE, APPEARANCEUR, LABSPEC, [place], GLUCOSEU, HGBUR, BILIRUBINUR, KETONESUR, PROTEINUR, UROBILINOGEN, NITRITE, LEUKOCYTESUR  Radiological Exams on Admission: Dg Chest Portable 1 View  Result Date: 08/07/2018 CLINICAL DATA:  Shortness of breath EXAM: PORTABLE CHEST 1 VIEW COMPARISON:  None. FINDINGS: The lungs are hyperinflated with diffuse interstitial coarsening. Cardiomediastinal  contours are normal. There is biapical scarring. No focal airspace consolidation or pulmonary edema. IMPRESSION: COPD without acute airspace disease. Electronically Signed   By: Ulyses Jarred M.D.   On: 08/07/2018 19:46    EKG: Independently reviewed.  Assessment/Plan Principal Problem:   COPD with acute bronchitis (HCC) Active Problems:   Atrial fibrillation with RVR (HCC)    1. Acute bronchitis - with COPD features on CXR (increased lung volumes) 1. COPD pathway 2. PRN xopenex 3. Scheduled inh steroid 4. PO prednisone daily 5. Scheduled LAMA 6. Rocephin 7. Will check influenza PCR (due to ongoing widespread flu activity in area). 8. No risk factors really for COVID-19, (not that we have a local test available anyhow). 1. No PNA on CXR 2. No fever 3. No travel 4. No sick contacts 2. A.Fib RVR - 1. Likely provoked by very large albuterol doses 2. Will try metoprolol 5mg  IV x1 3. Start cardizem gtt if this fails 4. CHADS-Vasc = 5, 2 points for age, 1 for gender, and 2 points for h/o  DVT a year ago. 5. Tele Monitor  DVT prophylaxis: Heparin GTT Code Status: Full Family Communication: Daughter at bedside Disposition Plan: Home after admit Consults called: None Admission status: Place in 88   Darvell Monteforte, [place] [facility]  How to contact the [facility] Attending or Consulting provider [place] or covering provider during after hours [place], for this patient?  1. Check the care team in [facility] and look for a) attending/consulting [facility] provider listed and b) the [facility] team listed 2. Log into [email or web address]  Amion Physician Scheduling and messaging for groups and whole hospitals  On call and physician scheduling software for group practices, residents, hospitalists and other medical providers for call, clinic, rotation and shift schedules. OnCall [employer] is a hospital-wide system for scheduling doctors and paging doctors on call. EasyPlot is for scientific plotting and data  analysis.  [email or web address]  and use [place]'s universal password to access. If you do not have the password, please contact the hospital operator.  3. Locate the [facility] provider you are looking for under [facility] and page to a number that you can be directly reached. 4. If you still have difficulty reaching the provider, please page the [facility] (Director on Call) for the Hospitalists listed on amion for assistance.  08/07/2018, 9:46 PM

## 2018-08-07 NOTE — ED Notes (Signed)
RN informed Pt can receive visitor  

## 2018-08-07 NOTE — ED Triage Notes (Signed)
Patient arrived from PCP  Via [facility] with reports of shortness of breath X 5 days EMS administered: Albuterol 10 mg Atrovent 0.5 mg Solu-Med 125 mg

## 2018-08-07 NOTE — ED Provider Notes (Signed)
I saw and evaluated the patient, reviewed the resident's note and I agree with the findings and plan.  EKG: EKG Interpretation  Date/Time:  Monday August 07 2018 16:40:47 EST Ventricular Rate:  90 PR Interval:    QRS Duration: 73 QT Interval:  325 QTC Calculation: 398 R Axis:   109 Text Interpretation:  Sinus rhythm Atrial premature complexes Right axis deviation Minimal ST depression, inferior leads Confirmed by Lacretia Leigh ([id number]) on 08/07/2018 5:34:54 PM 85 year old female presents with several days of cough congestion as well as wheezing.  Denies any CHF or anginal symptoms.  No fever or chills.  On exam she has diffuse wheezing.  Given albuterol as well Solu-Medrol.  Will likely be admitted to the hospital   Lacretia Leigh, MD 08/07/18 [phone]

## 2018-08-07 NOTE — ED Notes (Signed)
Patient HR is 160-190; Repeat EKG performed and Md notified. New orders received.

## 2018-08-07 NOTE — ED Provider Notes (Signed)
[place] EMERGENCY DEPARTMENT Provider Note   CSN: [id number] Arrival date & time: 08/07/18  1636    History   Chief Complaint Chief Complaint  Patient presents with  . Shortness of Breath    HPI Hannah Stein is a 85 y.o. female.      Shortness of Breath  Severity:  Moderate Onset quality:  Gradual Duration:  4 days Timing:  Constant Progression:  Worsening Chronicity:  New Context: activity and URI   Relieved by:  None tried Worsened by:  Nothing Ineffective treatments:  None tried Associated symptoms: no abdominal pain, no chest pain, no cough, no ear pain, no fever, no rash, no sore throat, no sputum production and no vomiting   Risk factors: no obesity and no prolonged immobilization     Past Medical History:  Diagnosis Date  . DVT, lower extremity [facility])     Patient Active Problem List   Diagnosis Date Noted  . Atrial fibrillation with RVR ([place]) 08/07/2018  . COPD with acute bronchitis ([place]) 08/07/2018    Past Surgical History:  Procedure Laterality Date  . THROAT SURGERY       OB History   No obstetric history on file.      Home Medications    Prior to Admission medications   Medication Sig Start Date End Date Taking? Authorizing Provider  acetaminophen (TYLENOL) 325 MG tablet Take 325 mg by mouth every 6 (six) hours as needed for headache (pain).   Yes [provider]  calcium carbonate (TUMS - DOSED IN MG ELEMENTAL CALCIUM) 500 MG chewable tablet Chew 2 tablets by mouth daily after supper.    Yes [provider]  Dextromethorphan-guaiFENesin (ROBITUSSIN DM PO) Take 5 mLs by mouth 2 (two) times daily as needed (cough).   Yes [provider]  Glucosamine-Chondroitin (GLUCOSAMINE CHONDR COMPLEX PO) Take 1 tablet by mouth daily.   Yes [provider]  Throat Lozenges (COUGH DROPS MT) Use as directed 1 lozenge in the mouth or throat 2 (two) times daily as needed (cough).   Yes [provider]  vitamin B-12 (CYANOCOBALAMIN) 500 MCG tablet Take 500 mcg by mouth daily.   Yes [provider]  Vitamin D, Cholecalciferol, 50 MCG (2000 UT) CAPS Take 2,000 Units by mouth daily.   Yes [provider]  vitamin E 400 UNIT capsule Take 400 Units by mouth daily.    Yes [provider]  Rivaroxaban 15 & 20 MG TBPK Take 15 mg by mouth 2 (two) times daily. Take as directed on package: Start with one 15mg  tablet by mouth twice a day with food. On Day 22, switch to one 20mg  tablet once a day with food. Patient not taking: Reported on 08/07/2018 07/15/17   Kinnie Feil, PA-C    Family History No family history on file.  Social History Social History   Tobacco Use  . Smoking status: Former Smoker    Types: Cigarettes    Last attempt to quit: 07/22/1982    Years since quitting: 36.0  . Smokeless tobacco: Never Used  Substance Use Topics  . Alcohol use: No    Frequency: Never  . Drug use: No     Allergies   Patient has no known allergies.   Review of Systems Review of Systems  Constitutional: Negative for chills and fever.  HENT: Negative for ear pain and sore throat.   Eyes: Negative for pain and visual disturbance.  Respiratory: Positive for shortness of breath.  Negative for cough and sputum production.   Cardiovascular: Negative for chest pain and palpitations.  Gastrointestinal: Negative for abdominal pain and vomiting.  Genitourinary: Negative for dysuria and hematuria.  Musculoskeletal: Negative for arthralgias and back pain.  Skin: Negative for color change and rash.  Neurological: Negative for seizures and syncope.  All other systems reviewed and are negative.    Physical Exam Updated Vital Signs BP 108/71 (BP Location: Right Arm)   Pulse (!) 138   Temp (!) 97.5 F (36.4 C) (Oral)   Resp (!) 26   Ht 5\' 4"  (1.626 m)   Wt 58.1 kg   SpO2 97%   BMI 21.97 kg/m   Physical Exam Vitals signs and nursing note reviewed.    Constitutional:      General: She is not in acute distress.    Appearance: She is well-developed.     Comments: Patient tachypneic, however talking in complete sentences.  Denies any pain or infectious-like symptoms.  HENT:     Head: Normocephalic and atraumatic.  Eyes:     Extraocular Movements: Extraocular movements intact.     Conjunctiva/sclera: Conjunctivae normal.  Neck:     Musculoskeletal: Neck supple.  Cardiovascular:     Rate and Rhythm: Normal rate and regular rhythm.     Heart sounds: No murmur.  Pulmonary:     Effort: Pulmonary effort is normal. No respiratory distress.     Breath sounds: Examination of the left-upper field reveals wheezing. Examination of the right-middle field reveals wheezing. Examination of the left-middle field reveals wheezing. Wheezing and rhonchi present.  Abdominal:     Palpations: Abdomen is soft.     Tenderness: There is no abdominal tenderness.  Musculoskeletal: Normal range of motion.     Right lower leg: No edema.     Left lower leg: No edema.  Skin:    General: Skin is warm and dry.     Capillary Refill: Capillary refill takes less than 2 seconds.  Neurological:     General: No focal deficit present.     Mental Status: She is alert.  Psychiatric:        Mood and Affect: Mood normal.      ED Treatments / Results  Labs (all labs ordered are listed, but only abnormal results are displayed) Labs Reviewed  COMPREHENSIVE METABOLIC PANEL - Abnormal; Notable for the following components:      Result Value   Glucose, Bld 165 (*)    BUN 24 (*)    Creatinine, Ser 1.28 (*)    Albumin 3.4 (*)    AST 47 (*)    GFR calc non Af Amer 37 (*)    GFR calc Af Amer 43 (*)    All other components within normal limits  CBC WITH DIFFERENTIAL/PLATELET - Abnormal; Notable for the following components:   WBC 12.0 (*)    RBC 5.87 (*)    Hemoglobin 16.3 (*)    HCT 53.6 (*)    Neutro Abs 9.9 (*)    Monocytes Absolute 1.2 (*)    All other  components within normal limits  INFLUENZA PANEL BY PCR (TYPE A & B)  HIV ANTIBODY (ROUTINE TESTING W REFLEX)  HEPARIN LEVEL (UNFRACTIONATED)  CBC    EKG EKG Interpretation  Date/Time:  Monday August 07 2018 16:40:47 EST Ventricular Rate:  90 PR Interval:    QRS Duration: 73 QT Interval:  325 QTC Calculation: 398 R Axis:   109 Text Interpretation:  Sinus rhythm Atrial premature  complexes Right axis deviation Minimal ST depression, inferior leads Confirmed by Lacretia Leigh ([id number]) on 08/07/2018 5:38:27 PM   Radiology Dg Chest Portable 1 View  Result Date: 08/07/2018 CLINICAL DATA:  Shortness of breath EXAM: PORTABLE CHEST 1 VIEW COMPARISON:  None. FINDINGS: The lungs are hyperinflated with diffuse interstitial coarsening. Cardiomediastinal contours are normal. There is biapical scarring. No focal airspace consolidation or pulmonary edema. IMPRESSION: COPD without acute airspace disease. Electronically Signed   By: Ulyses Jarred M.D.   On: 08/07/2018 19:46    Procedures Procedures (including critical care time)  Medications Ordered in ED Medications  benzonatate (TESSALON) capsule 200 mg (has no administration in time range)  cefTRIAXone (ROCEPHIN) 1 g in sodium chloride 0.9 % 100 mL IVPB (has no administration in time range)  predniSONE (DELTASONE) tablet 40 mg (has no administration in time range)  budesonide (PULMICORT) nebulizer solution 2 mg (has no administration in time range)  umeclidinium bromide (INCRUSE ELLIPTA) 62.5 MCG/INH 1 puff (has no administration in time range)  levalbuterol (XOPENEX) nebulizer solution 0.63 mg (has no administration in time range)  diltiazem (CARDIZEM) 100 mg in dextrose 5% 182mL (1 mg/mL) infusion (has no administration in time range)  heparin ADULT infusion 100 units/mL (25000 units/228mL sodium chloride 0.45%) (has no administration in time range)  heparin bolus via infusion 2,900 Units (has no administration in time range)    ipratropium-albuterol (DUONEB) 0.5-2.5 (3) MG/3ML nebulizer solution 3 mL (3 mLs Nebulization Given 08/07/18 1747)  albuterol (PROVENTIL,VENTOLIN) solution continuous neb (10 mg/hr Nebulization Given 08/07/18 1819)  diltiazem (CARDIZEM) injection 15 mg (15 mg Intravenous Given 08/07/18 2013)  lactated ringers bolus 1,000 mL (1,000 mLs Intravenous New Bag/Given 08/07/18 2019)  metoprolol tartrate (LOPRESSOR) injection 5 mg (5 mg Intravenous Given 08/07/18 2154)     Initial Impression / Assessment and Plan / ED Course  I have reviewed the triage vital signs and the nursing notes.  Pertinent labs & imaging results that were available during my care of the patient were reviewed by me and considered in my medical decision making (see chart for details).        85 year old female with significant past medical history of PE, was on Xarelto for 6 months and does not take it anymore with no other past medical history presents with URI-like symptoms without fever.  Patient presented to primary care office with saturations in the low 80s with audible wheezing.  EMS was called and gave albuterol, Atrovent, Solu-Medrol.  Patient on arrival tachypneic, hemodynamically stable, afebrile.  Concern for COPD, emphysema, pneumonia other acute pulmonary etiology at this time.  Patient denies any chest pain, peripheral swelling, fever, night sweats, chills.  Patient otherwise resting comfortably, no acute distress.  We will give additional DuoNeb at this time due to continued wheezing as well as perform basic laboratory studies, chest x-ray.  Laboratory studies and imaging indicates leukocytosis, baseline creatinine, COPD changes on x-ray.  After continuous albuterol patient in RVR, stable blood pressure, asymptomatic.  Will give Cardizem 50 mg at this time; did not achieve rate control.  Discussed with inpatient team who is in agreement with admission for COPD exacerbation, A. fib RVR.  Patient reassessment currently  denies any chest pain.  Inpatient team recommended metoprolol 5 mg and that they will admit the patient for continued work-up and evaluation.  They will determine appropriate antibiotics if necessary.  Patient in agreement with admission and plan.  Patient admitted in stable condition.  The above care was discussed and agreed upon  by my attending physician.  Final Clinical Impressions(s) / ED Diagnoses   Final diagnoses:  SOB (shortness of breath)    ED Discharge Orders    None       Orson Aloe, MD 08/07/18 2214    Lacretia Leigh, MD 08/07/18 [phone]

## 2018-08-07 NOTE — Progress Notes (Addendum)
ANTICOAGULATION CONSULT NOTE - Initial Consult  Pharmacy Consult for heparin Indication: atrial fibrillation  No Known Allergies  Patient Measurements:   Heparin Dosing Weight: 58.1 kg  Vital Signs: Temp: 97.5 F (36.4 C) (03/02 1642) Temp Source: Oral (03/02 1642) BP: 129/86 (03/02 2126) Pulse Rate: 153 (03/02 2126)  Labs: Recent Labs    08/07/18 1736  HGB 16.3*  HCT 53.6*  PLT 209  CREATININE 1.28*    CrCl cannot be calculated (Unknown ideal weight.).   Medical History: Past Medical History:  Diagnosis Date  . DVT, lower extremity (HCC)     Medications:  Scheduled:  . [START ON 08/08/2018] budesonide (PULMICORT) nebulizer solution  2 mg Nebulization Q6H  . heparin  2,900 Units Intravenous Once  . [START ON 08/08/2018] predniSONE  40 mg Oral Q breakfast  . umeclidinium bromide  1 puff Inhalation Daily   Infusions:  . cefTRIAXone (ROCEPHIN)  IV    . diltiazem (CARDIZEM) infusion    . heparin      Assessment: 51 yoF admitted 08/07/18 with cough/congestion. Pharmacy consulted for heparin dosing for atrial fibrillation in setting of CHA2DS2VASc score of 5 (age, sex, previous hxo PE). Patient was on Xarelto previously for history of PE, but completed treatment August 2019 and is no longer currently on anticoagulation. Baseline CBC stable.     Goal of Therapy:  Heparin level 0.3-0.7 units/ml Monitor platelets by anticoagulation protocol: Yes   Plan:  Give 2900 units bolus x 1 Start heparin infusion at 800 units/hr Check anti-Xa level in 8 hours and daily while on heparin Continue to monitor H&H and platelets  Thank you for allowing pharmacy to be a part of this patient's care.  Leron Croak, PharmD PGY1 Pharmacy Resident Please check AMION for all [facility] Pharmacy phone numbers  08/07/2018,9:47 PM

## 2018-08-08 ENCOUNTER — Encounter (HOSPITAL_COMMUNITY): Payer: Self-pay | Admitting: Student

## 2018-08-08 DIAGNOSIS — I4891 Unspecified atrial fibrillation: Secondary | ICD-10-CM | POA: Diagnosis present

## 2018-08-08 DIAGNOSIS — N183 Chronic kidney disease, stage 3 (moderate): Secondary | ICD-10-CM | POA: Diagnosis present

## 2018-08-08 DIAGNOSIS — I272 Pulmonary hypertension, unspecified: Secondary | ICD-10-CM | POA: Diagnosis present

## 2018-08-08 DIAGNOSIS — I361 Nonrheumatic tricuspid (valve) insufficiency: Secondary | ICD-10-CM | POA: Diagnosis not present

## 2018-08-08 DIAGNOSIS — Z79899 Other long term (current) drug therapy: Secondary | ICD-10-CM | POA: Diagnosis not present

## 2018-08-08 DIAGNOSIS — Z86718 Personal history of other venous thrombosis and embolism: Secondary | ICD-10-CM | POA: Diagnosis not present

## 2018-08-08 DIAGNOSIS — J440 Chronic obstructive pulmonary disease with acute lower respiratory infection: Secondary | ICD-10-CM | POA: Diagnosis present

## 2018-08-08 DIAGNOSIS — J209 Acute bronchitis, unspecified: Secondary | ICD-10-CM | POA: Diagnosis present

## 2018-08-08 DIAGNOSIS — Z87891 Personal history of nicotine dependence: Secondary | ICD-10-CM | POA: Diagnosis not present

## 2018-08-08 DIAGNOSIS — R0602 Shortness of breath: Secondary | ICD-10-CM | POA: Diagnosis present

## 2018-08-08 DIAGNOSIS — J441 Chronic obstructive pulmonary disease with (acute) exacerbation: Secondary | ICD-10-CM | POA: Diagnosis present

## 2018-08-08 DIAGNOSIS — Z8249 Family history of ischemic heart disease and other diseases of the circulatory system: Secondary | ICD-10-CM | POA: Diagnosis not present

## 2018-08-08 DIAGNOSIS — J9601 Acute respiratory failure with hypoxia: Secondary | ICD-10-CM | POA: Diagnosis present

## 2018-08-08 DIAGNOSIS — D72829 Elevated white blood cell count, unspecified: Secondary | ICD-10-CM | POA: Diagnosis not present

## 2018-08-08 LAB — CBC
HCT: 48.6 % — ABNORMAL HIGH (ref 36.0–46.0)
Hemoglobin: 15.2 g/dL — ABNORMAL HIGH (ref 12.0–15.0)
MCH: 28.2 pg (ref 26.0–34.0)
MCHC: 31.3 g/dL (ref 30.0–36.0)
MCV: 90.2 fL (ref 80.0–100.0)
PLATELETS: 182 10*3/uL (ref 150–400)
RBC: 5.39 MIL/uL — ABNORMAL HIGH (ref 3.87–5.11)
RDW: 13.2 % (ref 11.5–15.5)
WBC: 12.5 10*3/uL — ABNORMAL HIGH (ref 4.0–10.5)
nRBC: 0 % (ref 0.0–0.2)

## 2018-08-08 LAB — HEPARIN LEVEL (UNFRACTIONATED): Heparin Unfractionated: 0.35 IU/mL (ref 0.30–0.70)

## 2018-08-08 LAB — INFLUENZA PANEL BY PCR (TYPE A & B)
INFLAPCR: NEGATIVE
Influenza B By PCR: NEGATIVE

## 2018-08-08 LAB — HIV ANTIBODY (ROUTINE TESTING W REFLEX): HIV Screen 4th Generation wRfx: NONREACTIVE

## 2018-08-08 MED ORDER — UMECLIDINIUM BROMIDE 62.5 MCG/INH IN AEPB
1.0000 | INHALATION_SPRAY | Freq: Every day | RESPIRATORY_TRACT | Status: DC
  Administered 2018-08-08: 1 via RESPIRATORY_TRACT
  Filled 2018-08-08: qty 7

## 2018-08-08 MED ORDER — GUAIFENESIN ER 600 MG PO TB12
600.0000 mg | ORAL_TABLET | Freq: Two times a day (BID) | ORAL | Status: DC
  Administered 2018-08-08 – 2018-08-11 (×7): 600 mg via ORAL
  Filled 2018-08-08 (×7): qty 1

## 2018-08-08 MED ORDER — LEVALBUTEROL HCL 0.63 MG/3ML IN NEBU
0.6300 mg | INHALATION_SOLUTION | Freq: Four times a day (QID) | RESPIRATORY_TRACT | Status: DC
  Administered 2018-08-08 – 2018-08-10 (×7): 0.63 mg via RESPIRATORY_TRACT
  Filled 2018-08-08 (×7): qty 3

## 2018-08-08 MED ORDER — BUDESONIDE 0.5 MG/2ML IN SUSP
0.5000 mg | Freq: Two times a day (BID) | RESPIRATORY_TRACT | Status: DC
  Administered 2018-08-08 – 2018-08-11 (×6): 0.5 mg via RESPIRATORY_TRACT
  Filled 2018-08-08 (×7): qty 2

## 2018-08-08 MED ORDER — METHYLPREDNISOLONE SODIUM SUCC 125 MG IJ SOLR
60.0000 mg | Freq: Three times a day (TID) | INTRAMUSCULAR | Status: DC
  Administered 2018-08-08 – 2018-08-11 (×10): 60 mg via INTRAVENOUS
  Filled 2018-08-08 (×12): qty 2

## 2018-08-08 NOTE — Progress Notes (Signed)
PROGRESS NOTE    Hannah Stein  ZOX:[id number] DOB: 12/29/1928 DOA: 08/07/2018 PCP: Kelton Pillar, MD   Brief Narrative; 85 year old with past medical history significant for DVT diagnosed last year who presents complaining of 5 days of shortness of breath, cough congestion wheezing. After nebulizer treatment receiving the ED she converted into A. fib RVR.   Assessment & Plan:   Principal Problem:   COPD with acute bronchitis ([place]) Active Problems:   Atrial fibrillation with RVR ([facility])   1-acute COPD exacerbation Patient presented with cough, worsening shortness of breath, bilateral wheezing on lung exam. Continue with a schedule nebulizer treatment.  Will start IV Solu-Medrol. Start guaifenesin. On IV Rocephin. Influenza panel negative.   2-A. fib RVR She was a started on Cardizem drip. Was also started on heparin drip. Cardiology has been consulted  3-leukocytosis: Follow trend.  Afebrile.  Chest x-ray negative for pneumonia.  4-stage III: Creatinine is stable.   Estimated body mass index is 21.97 kg/m as calculated from the following:   Height as of this encounter: 5\' 4"  (1.626 m).   Weight as of this encounter: 58.1 kg.   DVT prophylaxis: Heparin Code Status: Full code Family Communication: No family at bedside Disposition Plan: Remain in the hospital for treatment of acute COPD, and A. fib with RVR.  Consultants:   Cardiology   Procedures:   None   Antimicrobials:  Ceftriaxone   Subjective: She is still complaining of shortness of breath, productive cough, sending upper respiratory secretion.  Objective: Vitals:   08/08/18 0500 08/08/18 0545 08/08/18 0630 08/08/18 0715  BP: (!) 135/59 132/63 (!) 147/65 135/60  Pulse: 75 72 74 76  Resp: (!) 22 (!) 21 (!) 24 18  Temp:      TempSrc:      SpO2: 95% 95% 94% 95%  Weight:      Height:        Intake/Output Summary (Last 24 hours) at 08/08/2018 0755 Last data filed at 08/08/2018 0059 Gross  per 24 hour  Intake 8.2 ml  Output -  Net 8.2 ml   Filed Weights   08/07/18 2158  Weight: 58.1 kg    Examination:  General exam: Appears calm and comfortable  Respiratory system: Bilateral diffuse expiratory wheezing Cardiovascular system: S1 & S2 heard, IRR. Gastrointestinal system: Abdomen is nondistended, soft and nontender. No organomegaly or masses felt. Normal bowel sounds heard. Central nervous system: Alert and oriented. No focal neurological deficits. Extremities: Symmetric 5 x 5 power. Skin: No rashes, lesions or ulcers   Data Reviewed: I have personally reviewed following labs and imaging studies  CBC: Recent Labs  Lab 08/07/18 1736 08/08/18 0446  WBC 12.0* 12.5*  NEUTROABS 9.9*  --   HGB 16.3* 15.2*  HCT 53.6* 48.6*  MCV 91.3 90.2  PLT 209 811   Basic Metabolic Panel: Recent Labs  Lab 08/07/18 1736  NA 143  K 3.9  CL 107  CO2 24  GLUCOSE 165*  BUN 24*  CREATININE 1.28*  CALCIUM 9.5   GFR: Estimated Creatinine Clearance: 26.2 mL/min (A) (by C-G formula based on SCr of 1.28 mg/dL (H)). Liver Function Tests: Recent Labs  Lab 08/07/18 1736  AST 47*  ALT 36  ALKPHOS 95  BILITOT 0.7  PROT 7.3  ALBUMIN 3.4*   No results for input(s): LIPASE, AMYLASE in the last 168 hours. No results for input(s): AMMONIA in the last 168 hours. Coagulation Profile: No results for input(s): INR, PROTIME in the last 168 hours. Cardiac  Enzymes: No results for input(s): CKTOTAL, CKMB, CKMBINDEX, TROPONINI in the last 168 hours. BNP (last 3 results) No results for input(s): PROBNP in the last 8760 hours. HbA1C: No results for input(s): HGBA1C in the last 72 hours. CBG: No results for input(s): GLUCAP in the last 168 hours. Lipid Profile: No results for input(s): CHOL, HDL, LDLCALC, TRIG, CHOLHDL, LDLDIRECT in the last 72 hours. Thyroid Function Tests: No results for input(s): TSH, T4TOTAL, FREET4, T3FREE, THYROIDAB in the last 72 hours. Anemia Panel: No  results for input(s): VITAMINB12, FOLATE, FERRITIN, TIBC, IRON, RETICCTPCT in the last 72 hours. Sepsis Labs: No results for input(s): PROCALCITON, LATICACIDVEN in the last 168 hours.  No results found for this or any previous visit (from the past 240 hour(s)).       Radiology Studies: Dg Chest Portable 1 View  Result Date: 08/07/2018 CLINICAL DATA:  Shortness of breath EXAM: PORTABLE CHEST 1 VIEW COMPARISON:  None. FINDINGS: The lungs are hyperinflated with diffuse interstitial coarsening. Cardiomediastinal contours are normal. There is biapical scarring. No focal airspace consolidation or pulmonary edema. IMPRESSION: COPD without acute airspace disease. Electronically Signed   By: Ulyses Jarred M.D.   On: 08/07/2018 19:46        Scheduled Meds: . budesonide (PULMICORT) nebulizer solution  0.5 mg Nebulization BID  . guaiFENesin  600 mg Oral BID  . levalbuterol  0.63 mg Nebulization Q6H  . methylPREDNISolone (SOLU-MEDROL) injection  60 mg Intravenous Q8H  . umeclidinium bromide  1 puff Inhalation Daily   Continuous Infusions: . cefTRIAXone (ROCEPHIN)  IV Stopped (08/08/18 0309)  . diltiazem (CARDIZEM) infusion Stopped (08/08/18 0059)  . heparin 800 Units/hr (08/07/18 2350)     LOS: 0 days    Time spent: 35 minutes.     Elmarie Shiley, MD [facility] Pager [phone]  If 7PM-7AM, please contact night-coverage [email or web address] Password [facility] 08/08/2018, 7:55 AM

## 2018-08-08 NOTE — Progress Notes (Signed)
SATURATION QUALIFICATIONS: (This note is used to comply with regulatory documentation for home oxygen)  Patient Saturations on Room Air at Rest = 87%  Patient Saturations on 3 Liters of oxygen while Ambulating = 90%  Please briefly explain why patient needs home oxygen: Patient unable to maintain adequate oxygen saturations on RA while seated. Oxygen saturations dropped to 85% on 2L while ambulating and required 3L to return to >90%. Patient will require supplemental oxygen at home at this time to maintain adequate oxygen saturations.   Erick Blinks, SPT

## 2018-08-08 NOTE — ED Notes (Addendum)
PT at bedside to evaluate; Pt voided on chux pad not in purwick. Chux soaked

## 2018-08-08 NOTE — Consult Note (Addendum)
Cardiology Consult    Patient ID: Hannah Stein MRN: [id number], DOB/AGE: 1928/07/05   Admit date: 08/07/2018 Date of Consult: 08/08/2018  Primary Physician: Kelton Pillar, MD Primary Cardiologist: New to [facility] (Dr. Harrell Gave) Requesting Provider: Niel Hummer, MD  Patient Profile    Hannah Stein is a 85 y.o. female with a history of DVT but no other significant past medical history, who is being seen today for the evaluation of atrial fibrillation with RVR at the request of Dr. Tyrell Antonio.  History of Present Illness    Hannah Stein is a 85 year old female with no known cardiac disease. She has never seen a Cardiologist or had any type of cardiac work-up. Patient presented to the ED yesterday for evaluation of cough. Patient developed a cough, chest congestion, shortness of breath, and possible wheezing about 1 week ago that has since gotten progressively worse. She denies any fever, chills, or body aches. Patient went to see PCP yesterday and O2 sats were in the 80's so she was sent to the ED via EMS for further evaluation. En route to the ED, EMS administered Albuterol 10mg , Atrovent 0.5mg , and Solumedrol 125mg . Upon arrival to the ED, patient tachypneic. EKG showed normal sinus rhythm. An additional DuoNeb was given and then patient was noted to be in atrial fibrillation with ventricular rates as high as the 190's. Cardizem bolus and drip were started with eventual improvement of rates. Currently, patient appears to be in normal sinus rhythm with frequent PACs (possibly wandering atrial tachycardia but clear P waves).   Patient reports palpitations yesterday while she was in atrial fibrillation with RVR but denies any at this time. Patient denies any chest pain, lightheadedness, or dizziness. Patient's daughter states she has noticed that patient has been getting more short of breath with activity for the last 6 to 8 months. Patient denies any orthopnea or PND. She did have some lower  extremity swelling due to a DVT in her right leg but she wears compression stocking everyday which has helped.   Past Medical History   Past Medical History:  Diagnosis Date  . DVT, lower extremity [facility])     Past Surgical History:  Procedure Laterality Date  . THROAT SURGERY       Allergies  No Known Allergies  Inpatient Medications    . budesonide (PULMICORT) nebulizer solution  0.5 mg Nebulization BID  . guaiFENesin  600 mg Oral BID  . levalbuterol  0.63 mg Nebulization Q6H  . methylPREDNISolone (SOLU-MEDROL) injection  60 mg Intravenous Q8H  . umeclidinium bromide  1 puff Inhalation Daily    Family History    Family History  Problem Relation Age of Onset  . Heart failure Mother   . CVA Mother        "mini strokes"   She indicated that her mother is deceased. She indicated that her father is deceased.   Social History    Social History   Socioeconomic History  . Marital status: Divorced    Spouse name: Not on file  . Number of children: Not on file  . Years of education: Not on file  . Highest education level: Not on file  Occupational History  . Not on file  Social Needs  . Financial resource strain: Not on file  . Food insecurity:    Worry: Not on file    Inability: Not on file  . Transportation needs:    Medical: Not on file    Non-medical: Not on  file  Tobacco Use  . Smoking status: Former Smoker    Types: Cigarettes    Last attempt to quit: 07/22/1982    Years since quitting: 36.0  . Smokeless tobacco: Never Used  Substance and Sexual Activity  . Alcohol use: No    Frequency: Never  . Drug use: No  . Sexual activity: Not on file  Lifestyle  . Physical activity:    Days per week: Not on file    Minutes per session: Not on file  . Stress: Not on file  Relationships  . Social connections:    Talks on phone: Not on file    Gets together: Not on file    Attends religious service: Not on file    Active member of club or organization: Not  on file    Attends meetings of clubs or organizations: Not on file    Relationship status: Not on file  . Intimate partner violence:    Fear of current or ex partner: Not on file    Emotionally abused: Not on file    Physically abused: Not on file    Forced sexual activity: Not on file  Other Topics Concern  . Not on file  Social History Narrative  . Not on file     Review of Systems    Review of Systems  Constitutional: Negative for chills, diaphoresis and fever.  HENT: Negative for congestion and sore throat.   Eyes: Negative for blurred vision and double vision.  Respiratory: Positive for cough, shortness of breath and wheezing. Negative for hemoptysis.   Cardiovascular: Positive for palpitations and leg swelling. Negative for chest pain, orthopnea and PND.  Gastrointestinal: Negative for blood in stool, nausea and vomiting.  Genitourinary: Negative for hematuria.  Musculoskeletal: Negative for falls and myalgias.  Neurological: Negative for dizziness and loss of consciousness.  Endo/Heme/Allergies: Does not bruise/bleed easily.    Physical Exam    Blood pressure 136/85, pulse 79, temperature (!) 97.5 F (36.4 C), temperature source Oral, resp. rate (!) 21, height 5\' 4"  (1.626 m), weight 58.1 kg, SpO2 92 %.  General: 85 y.o. female resting comfortably in no acute distress. Pleasant and cooperative. Currently on supplemental O2 via nasal cannula.  HEENT: Normal  Neck: Supple. No carotid bruits or JVD appreciated. Lungs: No significant increased work of breathing. Diminished breath sounds with scattered expiratory wheezing and some rhonchi. No significant crackles appreciated. Heart: RRR. Distinct S1 and S2. No significant murmurs, gallops, or rubs.  Abdomen: Soft, non-distended, and non-tender to palpation. Bowel sounds present. Extremities: No lower extremity edema. Radial and distal pedal pulses 2+ and equal bilaterally. Skin: Warm and dry. Neuro: Alert and oriented x3.  No focal deficits. Moves all extremities spontaneously. Psych: Normal affect. Responds appropriately.   Labs    Troponin (Point of Care Test) No results for input(s): TROPIPOC in the last 72 hours. No results for input(s): CKTOTAL, CKMB, TROPONINI in the last 72 hours. Lab Results  Component Value Date   WBC 12.5 (H) 08/08/2018   HGB 15.2 (H) 08/08/2018   HCT 48.6 (H) 08/08/2018   MCV 90.2 08/08/2018   PLT 182 08/08/2018    Recent Labs  Lab 08/07/18 1736  NA 143  K 3.9  CL 107  CO2 24  BUN 24*  CREATININE 1.28*  CALCIUM 9.5  PROT 7.3  BILITOT 0.7  ALKPHOS 95  ALT 36  AST 47*  GLUCOSE 165*   No results found for: CHOL, HDL, LDLCALC, TRIG No results  found for: [facility]   Radiology Studies    Dg Chest Portable 1 View  Result Date: 08/07/2018 CLINICAL DATA:  Shortness of breath EXAM: PORTABLE CHEST 1 VIEW COMPARISON:  None. FINDINGS: The lungs are hyperinflated with diffuse interstitial coarsening. Cardiomediastinal contours are normal. There is biapical scarring. No focal airspace consolidation or pulmonary edema. IMPRESSION: COPD without acute airspace disease. Electronically Signed   By: Ulyses Jarred M.D.   On: 08/07/2018 19:46    EKG     EKG: EKG was personally reviewed and demonstrates: Atrial fibrillation with ventricular rate of 196 with diffuse ST depression likely secondary to tachycardia.  Telemetry: Telemetry was personally reviewed and demonstrates: Normal sinus rhythm with controlled rate and frequent PVCs (possibly wandering atrial pacemaker but clear P waves visible). Short run of SVT up to 6-7 beats also noted.   Cardiac Imaging    None.  Assessment & Plan    New Onset Atrial Fibrillation with RVR - Patient presented with URI symptoms. She was initially in normal sinus rhythm but went into atrial fibrillation with RVR after multiple receiving large doses of albuterol. Rates got as high as the 190's. Patient was started on Cardizem drip and bolus with  improvement of rates.  - Patient currently in normal sinus rhythm on telemetry with frequent PVCs. - Will check Echo. - Patient no longer on any rate control medications and maintaining sinus rhythm with normal rate. - CHA2DS-VASc = 5 (DVT x2, age x2, and gender). Currently on IV Heparin. This can be discontinued and patient can be started on NOAC. She has been on Xarelto in the past for DVT and tolerated that well.   Acute Bronchitis - Management per primary team.   Signed, Darreld Mclean, PA-C 08/08/2018, 4:22 PM  For questions or updates, please contact   Please consult [email or web address] for contact info under Cardiology/STEMI.

## 2018-08-08 NOTE — ED Notes (Signed)
Report given to Stephanie

## 2018-08-08 NOTE — Progress Notes (Signed)
Physical Therapy Evaluation Patient Details Name: Hannah Stein MRN: [id number] DOB: Mar 11, 1929 Today's Date: 08/08/2018   History of Present Illness  Patient is 85 y/o female presenting to hospital with worsening cough, congestion, and wheezing secondary to COPD and acute bronchitis.  Per notes, patient was NSR initially, but converted into A.Fib RVR (new onset) with HR 150s-190s. PMH includes DVT.   Clinical Impression  Patient presenting to hospital with symptoms above and with deficits below. Patient required min guard-supervision for ambulation with use of RW. Patient unable to maintain adequate oxygen saturations on RA, and required use of 2L of oxygen to maintain oxygen >90% at rest. Oxygen saturations dropped to 85% on 2L during ambulation and returned to >90% with 3L of oxygen. Given current functional mobility deficits, recommending HHPT following d/c. Patient will benefit from acute physical therapy to maximize independence and safety with functional mobility.      Follow Up Recommendations Home health PT;Supervision for mobility/OOB    Equipment Recommendations  Rolling walker with 5" wheels    Recommendations for Other Services       Precautions / Restrictions Precautions Precautions: None Restrictions Weight Bearing Restrictions: No      Mobility  Bed Mobility Overal bed mobility: Needs Assistance Bed Mobility: Supine to Sit     Supine to sit: Supervision     General bed mobility comments: Patient required supervision for bed mobility for safety. Patient oxygen saturations dropped to 87% on RA and requried 2L of oxygen to return to >90%.   Transfers Overall transfer level: Needs assistance Equipment used: Rolling walker (2 wheeled) Transfers: Sit to/from Stand Sit to Stand: Min guard         General transfer comment: Patient required min guard to stand with use of RW for safety. Verbal cues to not push up using RW.  Ambulation/Gait Ambulation/Gait  assistance: Supervision;Min guard Gait Distance (Feet): 150 Feet Assistive device: Rolling walker (2 wheeled) Gait Pattern/deviations: Step-through pattern;Decreased stride length Gait velocity: decreased Gait velocity interpretation: 1.31 - 2.62 ft/sec, indicative of limited community ambulator General Gait Details: Patient ambulated with supervision-min guard for safety and use of RW. Patient denies dizziness. Oxygen saturations decreased to 85% on 2L and required 3L to remain above 90% during ambulation. Patient able to maintain adequate oxygen saturations on 2L when seated following ambulation.   Stairs            Wheelchair Mobility    Modified Rankin (Stroke Patients Only)       Balance Overall balance assessment: Needs assistance Sitting-balance support: No upper extremity supported;Feet supported Sitting balance-Leahy Scale: Good     Standing balance support: Bilateral upper extremity supported Standing balance-Leahy Scale: Poor Standing balance comment: reliant on BUE support to maintain standing balance                             Pertinent Vitals/Pain Pain Assessment: Faces Faces Pain Scale: No hurt    Home Living Family/patient expects to be discharged to:: Private residence Living Arrangements: Alone Available Help at Discharge: Family;Available PRN/intermittently Type of Home: House Home Access: Level entry     Home Layout: One level Home Equipment: None      Prior Function Level of Independence: Independent         Comments: Patient and family report patient still drives     Hand Dominance        Extremity/Trunk Assessment   Upper Extremity Assessment Upper Extremity  Assessment: Defer to OT evaluation    Lower Extremity Assessment Lower Extremity Assessment: Generalized weakness    Cervical / Trunk Assessment Cervical / Trunk Assessment: Normal  Communication   Communication: No difficulties  Cognition  Arousal/Alertness: Awake/alert Behavior During Therapy: WFL for tasks assessed/performed Overall Cognitive Status: Within Functional Limits for tasks assessed                                        General Comments General comments (skin integrity, edema, etc.): Patient family in room during session.     Exercises     Assessment/Plan    PT Assessment Patient needs continued PT services  PT Problem List Decreased strength;Decreased activity tolerance;Decreased balance;Decreased mobility;Decreased knowledge of use of DME       PT Treatment Interventions DME instruction;Gait training;Stair training;Functional mobility training;Therapeutic activities;Therapeutic exercise;Balance training;Patient/family education    PT Goals (Current goals can be found in the Care Plan section)  Acute Rehab PT Goals Patient Stated Goal: "get up to a room" PT Goal Formulation: With patient Time For Goal Achievement: 08/22/18 Potential to Achieve Goals: Good    Frequency Min 3X/week   Barriers to discharge        Co-evaluation               AM-PAC PT "6 Clicks" Mobility  Outcome Measure Help needed turning from your back to your side while in a flat bed without using bedrails?: None Help needed moving from lying on your back to sitting on the side of a flat bed without using bedrails?: A Little Help needed moving to and from a bed to a chair (including a wheelchair)?: A Little Help needed standing up from a chair using your arms (e.g., wheelchair or bedside chair)?: A Little Help needed to walk in hospital room?: A Little Help needed climbing 3-5 steps with a railing? : A Lot 6 Click Score: 18    End of Session Equipment Utilized During Treatment: Gait belt Activity Tolerance: Patient tolerated treatment well Patient left: in bed;with call bell/phone within reach;with family/visitor present(sitting EOB) Nurse Communication: Mobility status;Other (comment)(oxygen  saturations) PT Visit Diagnosis: Other abnormalities of gait and mobility (R26.89);Muscle weakness (generalized) (M62.81)    Time: 1497-0263 PT Time Calculation (min) (ACUTE ONLY): 24 min   Charges:   PT Evaluation $PT Eval Low Complexity: 1 Low PT Treatments $Gait Training: 8-22 mins        Erick Blinks, SPT  Erick Blinks 08/08/2018, 3:11 PM

## 2018-08-08 NOTE — ED Notes (Signed)
Patient placed on hospital bed for comfort °

## 2018-08-08 NOTE — ED Notes (Signed)
Heart Healthy Diet was ordered for Lunch. 

## 2018-08-08 NOTE — Progress Notes (Signed)
[place] for heparin Indication: atrial fibrillation  No Known Allergies  Patient Measurements: Height: 5\' 4"  (162.6 cm) Weight: 128 lb (58.1 kg) IBW/kg (Calculated) : 54.7 Heparin Dosing Weight: 58.1 kg  Vital Signs: BP: 135/59 (03/03 0500) Pulse Rate: 75 (03/03 0500)  Labs: Recent Labs    08/07/18 1736 08/08/18 0446 08/08/18 0522  HGB 16.3* 15.2*  --   HCT 53.6* 48.6*  --   PLT 209 182  --   HEPARINUNFRC  --   --  0.35  CREATININE 1.28*  --   --     Estimated Creatinine Clearance: 26.2 mL/min (A) (by C-G formula based on SCr of 1.28 mg/dL (H)).  Assessment: 85 y.o. female with Afib for heparin Goal of Therapy:  Heparin level 0.3-0.7 units/ml Monitor platelets by anticoagulation protocol: Yes   Plan:  Continue Heparin at current rate  Phillis Knack, PharmD, BCPS  08/08/2018,6:18 AM

## 2018-08-08 NOTE — ED Notes (Signed)
bfast tray ordered 

## 2018-08-09 ENCOUNTER — Inpatient Hospital Stay (HOSPITAL_COMMUNITY): Payer: Medicare Other

## 2018-08-09 DIAGNOSIS — I361 Nonrheumatic tricuspid (valve) insufficiency: Secondary | ICD-10-CM

## 2018-08-09 DIAGNOSIS — N183 Chronic kidney disease, stage 3 (moderate): Secondary | ICD-10-CM

## 2018-08-09 LAB — BASIC METABOLIC PANEL
Anion gap: 10 (ref 5–15)
BUN: 38 mg/dL — ABNORMAL HIGH (ref 8–23)
CO2: 24 mmol/L (ref 22–32)
CREATININE: 1.08 mg/dL — AB (ref 0.44–1.00)
Calcium: 8.7 mg/dL — ABNORMAL LOW (ref 8.9–10.3)
Chloride: 106 mmol/L (ref 98–111)
GFR calc non Af Amer: 46 mL/min — ABNORMAL LOW (ref >60)
GFR, EST AFRICAN AMERICAN: 53 mL/min — AB (ref >60)
GLUCOSE: 141 mg/dL — AB (ref 70–99)
Potassium: 4.1 mmol/L (ref 3.5–5.1)
Sodium: 140 mmol/L (ref 135–145)

## 2018-08-09 LAB — CBC
HCT: 48 % — ABNORMAL HIGH (ref 36.0–46.0)
Hemoglobin: 15 g/dL (ref 12.0–15.0)
MCH: 27.8 pg (ref 26.0–34.0)
MCHC: 31.3 g/dL (ref 30.0–36.0)
MCV: 89.1 fL (ref 80.0–100.0)
Platelets: 165 10*3/uL (ref 150–400)
RBC: 5.39 MIL/uL — ABNORMAL HIGH (ref 3.87–5.11)
RDW: 13 % (ref 11.5–15.5)
WBC: 12.8 10*3/uL — AB (ref 4.0–10.5)
nRBC: 0 % (ref 0.0–0.2)

## 2018-08-09 LAB — ECHOCARDIOGRAM COMPLETE
Height: 63 in
Weight: 2179.91 oz

## 2018-08-09 LAB — HEPARIN LEVEL (UNFRACTIONATED): Heparin Unfractionated: 0.18 IU/mL — ABNORMAL LOW (ref 0.30–0.70)

## 2018-08-09 MED ORDER — RIVAROXABAN 15 MG PO TABS
15.0000 mg | ORAL_TABLET | Freq: Every day | ORAL | Status: DC
  Administered 2018-08-09 – 2018-08-11 (×3): 15 mg via ORAL
  Filled 2018-08-09 (×3): qty 1

## 2018-08-09 MED ORDER — ENSURE ENLIVE PO LIQD
237.0000 mL | Freq: Two times a day (BID) | ORAL | Status: DC
  Administered 2018-08-09 – 2018-08-11 (×4): 237 mL via ORAL

## 2018-08-09 MED ORDER — DILTIAZEM HCL ER COATED BEADS 120 MG PO CP24
120.0000 mg | ORAL_CAPSULE | Freq: Every day | ORAL | Status: DC
  Administered 2018-08-09 – 2018-08-11 (×3): 120 mg via ORAL
  Filled 2018-08-09 (×3): qty 1

## 2018-08-09 NOTE — Progress Notes (Addendum)
PROGRESS NOTE    Hannah Stein  TMH:[id number] DOB: July 18, 1928 DOA: 08/07/2018 PCP: Kelton Pillar, MD   Brief Narrative:  85 year old with past medical history significant for DVT diagnosed last year who presents complaining of 5 days of shortness of breath, cough congestion wheezing. After nebulizer treatment receiving the ED she converted into A. fib RVR.   Assessment & Plan:   Principal Problem:   COPD with acute bronchitis ([place]) Active Problems:   Atrial fibrillation with RVR ([facility])   1-acute COPD exacerbation As noted on admission, Patient presented with cough, worsening shortness of breath, bilateral wheezing on lung exam-still present today. Continue with a schedule nebulizer treatment,  IV Solu-Medrol 60q8h c/w guaifenesin,  IV Rocephin. Influenza panel negative.   2-A. fib RVR She was a started on Cardizem drip and converted to dilt 120 daily Was also started on heparin drip on admission Cardiology has been consulted-appreciate their input  3-leukocytosis: flat, Follow,  Afebrile.  Chest x-ray negative for pneumonia.  4-CKD stage III: Creatinine is stable, GFR 46   Estimated body mass index is 21.97 kg/m as calculated from the following:   Height as of this encounter: 5\' 4"  (1.626 m).   Weight as of this encounter: 58.1 kg.  DVT prophylaxis: Heparin SQ  Code Status: FULL    Code Status Orders  (From admission, onward)         Start     Ordered   08/07/18 2140  Full code  Continuous     08/07/18 2144        Code Status History    This patient has a current code status but no historical code status.     Family Communication: Son at bedside Disposition Plan:   Remain in the hospital for treatment of acute COPD with acute respiratory failure and A. fib with RVR requiring titration of cardiac medications Consults called: NONE TODAY Admission status: Inpatient   Consultants:   CARDIOLOGY  Procedures:  Dg Chest Portable 1  View  Result Date: 08/07/2018 CLINICAL DATA:  Shortness of breath EXAM: PORTABLE CHEST 1 VIEW COMPARISON:  None. FINDINGS: The lungs are hyperinflated with diffuse interstitial coarsening. Cardiomediastinal contours are normal. There is biapical scarring. No focal airspace consolidation or pulmonary edema. IMPRESSION: COPD without acute airspace disease. Electronically Signed   By: Ulyses Jarred M.D.   On: 08/07/2018 19:46     Antimicrobials:   Ceftriaxone day #2   Subjective: Patient still thinks significant rales and rhonchi as well as mild to moderate wheezing.  Objective: Vitals:   08/09/18 0408 08/09/18 0525 08/09/18 0901 08/09/18 1005  BP: (!) 143/66   (!) 157/97  Pulse: 68     Resp: 15     Temp: (!) 97.5 F (36.4 C)     TempSrc: Oral     SpO2: 97%  95%   Weight:  61.8 kg    Height:        Intake/Output Summary (Last 24 hours) at 08/09/2018 1223 Last data filed at 08/09/2018 1009 Gross per 24 hour  Intake 840.04 ml  Output 400 ml  Net 440.04 ml   Filed Weights   08/07/18 2158 08/08/18 1637 08/09/18 0525  Weight: 58.1 kg 59.8 kg 61.8 kg    Examination:  General exam: Appears calm and comfortable  Respiratory system: Rhonchi throughout, mild to moderate wheezing bilaterally, no accessory muscle use. Cardiovascular system: S1 & S2 heard, RRR. No JVD, murmurs, rubs, gallops or clicks. No pedal edema. Gastrointestinal system: Abdomen is  nondistended, soft and nontender. No organomegaly or masses felt. Normal bowel sounds heard. Central nervous system: Alert and oriented. No focal neurological deficits. Extremities: Symmetric 5 x 5 power. Skin: No rashes, lesions or ulcers Psychiatry: Judgement and insight appear normal. Mood & affect appropriate.     Data Reviewed: I have personally reviewed following labs and imaging studies  CBC: Recent Labs  Lab 08/07/18 1736 08/08/18 0446 08/09/18 0332  WBC 12.0* 12.5* 12.8*  NEUTROABS 9.9*  --   --   HGB 16.3* 15.2*  15.0  HCT 53.6* 48.6* 48.0*  MCV 91.3 90.2 89.1  PLT 209 182 440   Basic Metabolic Panel: Recent Labs  Lab 08/07/18 1736 08/09/18 0332  NA 143 140  K 3.9 4.1  CL 107 106  CO2 24 24  GLUCOSE 165* 141*  BUN 24* 38*  CREATININE 1.28* 1.08*  CALCIUM 9.5 8.7*   GFR: Estimated Creatinine Clearance: 29.8 mL/min (A) (by C-G formula based on SCr of 1.08 mg/dL (H)). Liver Function Tests: Recent Labs  Lab 08/07/18 1736  AST 47*  ALT 36  ALKPHOS 95  BILITOT 0.7  PROT 7.3  ALBUMIN 3.4*   No results for input(s): LIPASE, AMYLASE in the last 168 hours. No results for input(s): AMMONIA in the last 168 hours. Coagulation Profile: No results for input(s): INR, PROTIME in the last 168 hours. Cardiac Enzymes: No results for input(s): CKTOTAL, CKMB, CKMBINDEX, TROPONINI in the last 168 hours. BNP (last 3 results) No results for input(s): PROBNP in the last 8760 hours. HbA1C: No results for input(s): HGBA1C in the last 72 hours. CBG: No results for input(s): GLUCAP in the last 168 hours. Lipid Profile: No results for input(s): CHOL, HDL, LDLCALC, TRIG, CHOLHDL, LDLDIRECT in the last 72 hours. Thyroid Function Tests: No results for input(s): TSH, T4TOTAL, FREET4, T3FREE, THYROIDAB in the last 72 hours. Anemia Panel: No results for input(s): VITAMINB12, FOLATE, FERRITIN, TIBC, IRON, RETICCTPCT in the last 72 hours. Sepsis Labs: No results for input(s): PROCALCITON, LATICACIDVEN in the last 168 hours.  No results found for this or any previous visit (from the past 240 hour(s)).       Radiology Studies: Dg Chest Portable 1 View  Result Date: 08/07/2018 CLINICAL DATA:  Shortness of breath EXAM: PORTABLE CHEST 1 VIEW COMPARISON:  None. FINDINGS: The lungs are hyperinflated with diffuse interstitial coarsening. Cardiomediastinal contours are normal. There is biapical scarring. No focal airspace consolidation or pulmonary edema. IMPRESSION: COPD without acute airspace disease.  Electronically Signed   By: Ulyses Jarred M.D.   On: 08/07/2018 19:46        Scheduled Meds: . budesonide (PULMICORT) nebulizer solution  0.5 mg Nebulization BID  . diltiazem  120 mg Oral Daily  . guaiFENesin  600 mg Oral BID  . levalbuterol  0.63 mg Nebulization Q6H  . methylPREDNISolone (SOLU-MEDROL) injection  60 mg Intravenous Q8H  . rivaroxaban  15 mg Oral Q supper  . umeclidinium bromide  1 puff Inhalation Daily   Continuous Infusions: . cefTRIAXone (ROCEPHIN)  IV 1 g (08/08/18 2138)     LOS: 1 day    Time spent: [place], MD [facility]  If 7PM-7AM, please contact night-coverage  08/09/2018, 12:23 PM

## 2018-08-09 NOTE — Evaluation (Signed)
Occupational Therapy Evaluation Patient Details Name: Hannah Stein MRN: [id number] DOB: [birth year] Today's Date: 08/09/2018    History of Present Illness Patient is 85 y/o female presenting to hospital with worsening cough, congestion, and wheezing secondary to COPD and acute bronchitis.  Per notes, patient was NSR initially, but converted into A.Fib RVR (new onset) with HR 150s-190s. PMH includes DVT.    Clinical Impression   Pt PTA: living alone, family visits, but pt was driving. PT currently limited by O2 needs- desatting on 2L to 86% requiring 3L to >90% in [place]. Pt performing bed mobility with supervisionA, transfers and ADL functional mobility with minguardA to minA. Pt requiring seated rest breaks for light ADl at sink. Pt requiring increased assists for ADL and mobility and unable to properly care for self at home. Pt would benefit from continued OT skilled services for ADL, mobility and safety in [place] setting. OT to follow acutely.    Follow Up Recommendations  Home health OT;Supervision - Intermittent    Equipment Recommendations  3 in 1 bedside commode    Recommendations for Other Services       Precautions / Restrictions Precautions Precautions: None Restrictions Weight Bearing Restrictions: No      Mobility Bed Mobility Overal bed mobility: Needs Assistance Bed Mobility: Supine to Sit     Supine to sit: Supervision        Transfers Overall transfer level: Needs assistance Equipment used: Rolling walker (2 wheeled) Transfers: Sit to/from Stand Sit to Stand: Min guard         General transfer comment: Patient required min guard to stand with use of RW for safety. Verbal cues to not push up using RW.    Balance Overall balance assessment: Needs assistance Sitting-balance support: No upper extremity supported;Feet supported Sitting balance-Leahy Scale: Good     Standing balance support: Bilateral upper extremity supported Standing balance-Leahy  Scale: Poor                             ADL either performed or assessed with clinical judgement   ADL Overall ADL's : Needs assistance/impaired Eating/Feeding: Set up   Grooming: Set up   Upper Body Bathing: Set up   Lower Body Bathing: Minimal assistance   Upper Body Dressing : Set up   Lower Body Dressing: Minimal assistance   Toilet Transfer: Min guard   Toileting- Clothing Manipulation and Hygiene: Minimal assistance       Functional mobility during ADLs: Min guard;Rolling walker General ADL Comments: set-upA for UB and MinA for LB     Vision Baseline Vision/History: No visual deficits Vision Assessment?: No apparent visual deficits     Perception     Praxis      Pertinent Vitals/Pain Pain Assessment: Faces Faces Pain Scale: No hurt     Hand Dominance     Extremity/Trunk Assessment Upper Extremity Assessment Upper Extremity Assessment: Generalized weakness   Lower Extremity Assessment Lower Extremity Assessment: Defer to PT evaluation   Cervical / Trunk Assessment Cervical / Trunk Assessment: Normal   Communication Communication Communication: No difficulties   Cognition Arousal/Alertness: Awake/alert Behavior During Therapy: WFL for tasks assessed/performed Overall Cognitive Status: Within Functional Limits for tasks assessed                                     General Comments  SpO2 levels 2-3L  O2 86% to 92% via [place]    Exercises     Shoulder Instructions      Home Living Family/patient expects to be discharged to:: Private residence Living Arrangements: Alone Available Help at Discharge: Family;Available PRN/intermittently Type of Home: House Home Access: Level entry     Home Layout: One level     Bathroom Shower/Tub: Occupational psychologist: Standard     Home Equipment: None          Prior Functioning/Environment Level of Independence: Independent        Comments: Patient and  family report patient still drives        OT Problem List: Decreased strength;Decreased activity tolerance;Impaired balance (sitting and/or standing);Decreased safety awareness;Pain      OT Treatment/Interventions: Self-care/ADL training;Therapeutic exercise;Neuromuscular education;Therapeutic activities;Patient/family education;Balance training    OT Goals(Current goals can be found in the care plan section) Acute Rehab OT Goals Patient Stated Goal: go home healthy OT Goal Formulation: With patient Time For Goal Achievement: 08/23/18 Potential to Achieve Goals: Good ADL Goals Pt Will Perform Grooming: Independently Pt Will Perform Lower Body Dressing: with modified independence Pt Will Perform Toileting - Clothing Manipulation and hygiene: with modified independence Additional ADL Goal #1: Pt will perform ADL functional mobility and transfers with modified independence and least restrictive device.  OT Frequency: Min 2X/week   Barriers to D/C:            Co-evaluation              AM-PAC OT "6 Clicks" Daily Activity     Outcome Measure Help from another person eating meals?: None Help from another person taking care of personal grooming?: A Little Help from another person toileting, which includes using toliet, bedpan, or urinal?: A Little Help from another person bathing (including washing, rinsing, drying)?: A Little Help from another person to put on and taking off regular upper body clothing?: A Little Help from another person to put on and taking off regular lower body clothing?: A Little 6 Click Score: 19   End of Session Equipment Utilized During Treatment: Gait belt;Rolling walker Nurse Communication: Mobility status  Activity Tolerance: Patient tolerated treatment well;Patient limited by lethargy Patient left: in bed;with call bell/phone within reach;with family/visitor present  OT Visit Diagnosis: Unsteadiness on feet (R26.81);Muscle weakness (generalized)  (M62.81)                Time: 1448-1856 OT Time Calculation (min): 30 min Charges:  OT General Charges $OT Visit: 1 Visit OT Evaluation $OT Eval Moderate Complexity: 1 Mod OT Treatments $Self Care/Home Management : 8-22 mins  Hannah Stein) Marsa Aris OTR/L Acute Rehabilitation Services Pager: [phone] Office: [phone]   Hannah Stein 08/09/2018, 5:51 PM

## 2018-08-09 NOTE — Progress Notes (Signed)
Physical Therapy Treatment Patient Details Name: Hannah Stein MRN: [id number] DOB: 02/23/1929 Today's Date: 08/09/2018    History of Present Illness Patient is 85 y/o female presenting to hospital with worsening cough, congestion, and wheezing secondary to COPD and acute bronchitis.  Per notes, patient was NSR initially, but converted into A.Fib RVR (new onset) with HR 150s-190s. PMH includes DVT.     PT Comments    Pt admitted with above diagnosis. Pt currently with functional limitations due to the deficits listed below (see PT Problem List). Pt was able to ambulate with min guard assist with RW and continues to need O2 to keep sats >90%.  Progressed distance.  Pt will benefit from skilled PT to increase their independence and safety with mobility to allow discharge to the venue listed below.     Follow Up Recommendations  Home health PT;Supervision for mobility/OOB     Equipment Recommendations  Rolling walker with 5" wheels    Recommendations for Other Services       Precautions / Restrictions Precautions Precautions: None Restrictions Weight Bearing Restrictions: No    Mobility  Bed Mobility Overal bed mobility: Needs Assistance Bed Mobility: Supine to Sit     Supine to sit: Supervision     General bed mobility comments: Patient required supervision for bed mobility for safety. Patient oxygen saturations dropped to 87% on RA and requried 2L of oxygen to return to >90%.   Transfers Overall transfer level: Needs assistance Equipment used: Rolling walker (2 wheeled) Transfers: Sit to/from Stand Sit to Stand: Min guard         General transfer comment: Patient required min guard to stand with use of RW for safety. Verbal cues to not push up using RW.  Ambulation/Gait Ambulation/Gait assistance: Supervision;Min guard Gait Distance (Feet): 250 Feet Assistive device: Rolling walker (2 wheeled) Gait Pattern/deviations: Step-through pattern;Decreased stride  length Gait velocity: decreased Gait velocity interpretation: 1.31 - 2.62 ft/sec, indicative of limited community ambulator General Gait Details: Patient ambulated with supervision-min guard for safety and use of RW. Patient denies dizziness. Oxygen saturations decreased to 85% on 2L and required 3L to remain above 90% during ambulation. Patient able to maintain adequate oxygen saturations on 2L when seated following ambulation.    Stairs             Wheelchair Mobility    Modified Rankin (Stroke Patients Only)       Balance Overall balance assessment: Needs assistance Sitting-balance support: No upper extremity supported;Feet supported Sitting balance-Leahy Scale: Good     Standing balance support: Bilateral upper extremity supported Standing balance-Leahy Scale: Poor Standing balance comment: reliant on BUE support to maintain standing balance                            Cognition Arousal/Alertness: Awake/alert Behavior During Therapy: WFL for tasks assessed/performed Overall Cognitive Status: Within Functional Limits for tasks assessed                                        Exercises General Exercises - Lower Extremity Long Arc Quad: AROM;Both;10 reps;Seated    General Comments        Pertinent Vitals/Pain Faces Pain Scale: No hurt    Home Living  Prior Function            PT Goals (current goals can now be found in the care plan section) Progress towards PT goals: Progressing toward goals    Frequency    Min 3X/week      PT Plan Current plan remains appropriate    Co-evaluation              AM-PAC PT "6 Clicks" Mobility   Outcome Measure  Help needed turning from your back to your side while in a flat bed without using bedrails?: None Help needed moving from lying on your back to sitting on the side of a flat bed without using bedrails?: A Little Help needed moving to and from  a bed to a chair (including a wheelchair)?: A Little Help needed standing up from a chair using your arms (e.g., wheelchair or bedside chair)?: A Little Help needed to walk in hospital room?: A Little Help needed climbing 3-5 steps with a railing? : A Lot 6 Click Score: 18    End of Session Equipment Utilized During Treatment: Gait belt;Oxygen Activity Tolerance: Patient tolerated treatment well Patient left: with call bell/phone within reach;in chair(sitting EOB) Nurse Communication: Mobility status;Other (comment)(oxygen saturations) PT Visit Diagnosis: Other abnormalities of gait and mobility (R26.89);Muscle weakness (generalized) (M62.81)     Time: 2902-1115 PT Time Calculation (min) (ACUTE ONLY): 16 min  Charges:  $Gait Training: 8-22 mins                     [place] Pager:  [phone]  Office:  [place] 08/09/2018, 1:22 PM

## 2018-08-09 NOTE — Progress Notes (Addendum)
[place] for heparin Indication: atrial fibrillation  No Known Allergies  Patient Measurements: Height: 5\' 3"  (160 cm) Weight: 136 lb 3.9 oz (61.8 kg) IBW/kg (Calculated) : 52.4 Heparin Dosing Weight: 58.1 kg  Vital Signs: Temp: 97.5 F (36.4 C) (03/04 0408) Temp Source: Oral (03/04 0408) BP: 143/66 (03/04 0408) Pulse Rate: 68 (03/04 0408)  Labs: Recent Labs    08/07/18 1736 08/08/18 0446 08/08/18 0522 08/09/18 0332  HGB 16.3* 15.2*  --  15.0  HCT 53.6* 48.6*  --  48.0*  PLT 209 182  --  165  HEPARINUNFRC  --   --  0.35 0.18*  CREATININE 1.28*  --   --  1.08*    Estimated Creatinine Clearance: 29.8 mL/min (A) (by C-G formula based on SCr of 1.08 mg/dL (H)).  Assessment: 85 y.o. female with Afib for heparin  Heparin level low this am at 0.18, no bleeding noted and cbc stable.   Goal of Therapy:  Heparin level 0.3-0.7 units/ml Monitor platelets by anticoagulation protocol: Yes   Plan:  Increase heparin to 950 units/hr - will likely be able to switch to DOAC today.  Would recommend low dose apixaban given her age and low crcl~30  Erin Hearing PharmD., BCPS Clinical Pharmacist 08/09/2018 8:49 AM  Addendum:  Will transition to xarelto at noon today. Given her crcl of ~30 will reduce dose to 15mg  daily.   08/09/2018 9:40 AM

## 2018-08-09 NOTE — Care Management Note (Addendum)
Case Management Note  Patient Details  Name: Hannah Stein MRN: [id number] Date of Birth: [birth year]  Subjective/Objective: Pt presented for SOB- COPD exacerbation. PTA independent from home. Patient has support of daughter and son-n-law. Daughter checks in on patient daily. Pt was still driving prior to hospitalization. Pt has PCP.                   Action/Plan: CM did speak with patient and daughter. Plan will be for home with [place]. Medicare.G[employer] list provided to both and choice made by daughter. [place]. CM did make referral- SOC to begin within 24-48 hours post transition of care. CM will continue to monitor for oxygen.   Expected Discharge Date:                  Expected Discharge Plan:  [place]  In-House Referral:  NA  Discharge planning Services  CM Consult  Post Acute Care Choice:  Home Health, Durable Medical Equipment Choice offered to:  Patient, Adult Children  DME Arranged:  Oxygen DME Agency:   [facility]  HH Arranged:  RN, Disease Management, PT [place] Agency:  [facility]  Status of Service: Completed  If discussed at Long Length of Stay Meetings, dates discussed:    Additional Comments: 1456 08-11-18 Jacqlyn Krauss. RN,[phone] Staff RN/PT ambulated the patient and pt saturation was 88-93%. 02 was never applied. With this documentation of saturations patient will not qualify for home 02. Staff RN is calling MD to make him aware. CM did call [facility] to cancel order for 02. Patient will not transition home on oxygen. No further needs from CM at this time.   1415 08-11-18 Jacqlyn Krauss, [place] [phone] DME referral sent to [facility] with [place]- DME 02 will be delivered to room prior to transition home. RW to be delivered as well. Dorian Pod with [facility] aware that pt will transition home today. No further needs from CM at this time.  Bethena Roys, RN 08/09/2018, 12:41 PM

## 2018-08-09 NOTE — Discharge Instructions (Signed)

## 2018-08-09 NOTE — Progress Notes (Signed)
Progress Note  Patient Name: Hannah Stein Date of Encounter: 08/09/2018  Primary Cardiologist: New--Dr. Harrell Gave  Subjective   Breathing/cough similar. No chest pain. Cannot feel any palpitations. Some mucous when she coughs.  Inpatient Medications    Scheduled Meds: . budesonide (PULMICORT) nebulizer solution  0.5 mg Nebulization BID  . guaiFENesin  600 mg Oral BID  . levalbuterol  0.63 mg Nebulization Q6H  . methylPREDNISolone (SOLU-MEDROL) injection  60 mg Intravenous Q8H  . umeclidinium bromide  1 puff Inhalation Daily   Continuous Infusions: . cefTRIAXone (ROCEPHIN)  IV 1 g (08/08/18 2138)  . diltiazem (CARDIZEM) infusion Stopped (08/08/18 0059)  . heparin 800 Units/hr (08/09/18 0155)   PRN Meds: benzonatate   Vital Signs    Vitals:   08/09/18 0132 08/09/18 0408 08/09/18 0525 08/09/18 0901  BP: 132/70 (!) 143/66    Pulse: 74 68    Resp: 20 15    Temp:  (!) 97.5 F (36.4 C)    TempSrc:  Oral    SpO2: 96% 97%  95%  Weight:   61.8 kg   Height:        Intake/Output Summary (Last 24 hours) at 08/09/2018 0928 Last data filed at 08/09/2018 0500 Gross per 24 hour  Intake 798.84 ml  Output 400 ml  Net 398.84 ml   Last 3 Weights 08/09/2018 08/08/2018 08/07/2018  Weight (lbs) 136 lb 3.9 oz 131 lb 13.4 oz 128 lb  Weight (kg) 61.8 kg 59.8 kg 58.06 kg      Telemetry    Intermittently between normal sinus rhythm with PACs and atrial fibrillation - Personally Reviewed  ECG    afib RVR at 196 bpm - Personally Reviewed  Physical Exam   GEN: No acute distress.   Neck: No JVD Cardiac: RRR, no murmurs, rubs, or gallops.  Respiratory: Rhoncorous cough with mild expiratory wheeze GI: Soft, nontender, non-distended  MS: No edema; No deformity. Neuro:  Nonfocal  Psych: Normal affect   Labs    Chemistry Recent Labs  Lab 08/07/18 1736 08/09/18 0332  NA 143 140  K 3.9 4.1  CL 107 106  CO2 24 24  GLUCOSE 165* 141*  BUN 24* 38*  CREATININE 1.28* 1.08*    CALCIUM 9.5 8.7*  PROT 7.3  --   ALBUMIN 3.4*  --   AST 47*  --   ALT 36  --   ALKPHOS 95  --   BILITOT 0.7  --   GFRNONAA 37* 46*  GFRAA 43* 53*  ANIONGAP 12 10     Hematology Recent Labs  Lab 08/07/18 1736 08/08/18 0446 08/09/18 0332  WBC 12.0* 12.5* 12.8*  RBC 5.87* 5.39* 5.39*  HGB 16.3* 15.2* 15.0  HCT 53.6* 48.6* 48.0*  MCV 91.3 90.2 89.1  MCH 27.8 28.2 27.8  MCHC 30.4 31.3 31.3  RDW 13.1 13.2 13.0  PLT 209 182 165    Cardiac EnzymesNo results for input(s): TROPONINI in the last 168 hours. No results for input(s): TROPIPOC in the last 168 hours.   BNPNo results for input(s): BNP, PROBNP in the last 168 hours.   DDimer No results for input(s): DDIMER in the last 168 hours.   Radiology    Dg Chest Portable 1 View  Result Date: 08/07/2018 CLINICAL DATA:  Shortness of breath EXAM: PORTABLE CHEST 1 VIEW COMPARISON:  None. FINDINGS: The lungs are hyperinflated with diffuse interstitial coarsening. Cardiomediastinal contours are normal. There is biapical scarring. No focal airspace consolidation or pulmonary edema. IMPRESSION: COPD  without acute airspace disease. Electronically Signed   By: Ulyses Jarred M.D.   On: 08/07/2018 19:46    Cardiac Studies   Echo pending  Patient Profile     85 y.o. female with URI who developed very rapid new onset atrial fibrillation with respiratory symptoms and use of nebulizer  Assessment & Plan    Atrial fibrillation: now intermittently between sinus and afib. Rates have been better controlled -off dilt drip, will start long acting dilt (avoiding beta blocker with wheezing) -consulted pharmacy for rivaroxaban; with CrCl today may be 15 mg dose. She prefers once daily over twice daily dosing (for apixaban). Chadsvasc=5 -echo pending  URI: per primary team  We will follow her response to medical therapy  For questions or updates, please contact [place] Please consult [email or web address] for contact info under    Signed, Buford Dresser, MD  08/09/2018, 9:28 AM

## 2018-08-09 NOTE — Care Management (Signed)
#   3.  S /W   MYRA  @ [employer] # [phone] OPT- 5   1. XARELTO   15 MG BID COVER- YES CO-PAY- $ 37.00 TIER- 3 DRUG PRIOR APPROVAL- NO  2. XARELTO  20 MG DAILY COVER- YES CO-PAY- $ 37.00 TIER- 3 DRUG PRIOR APPROVAL- NO  PREFERRED PHARMACY : YES [facility]

## 2018-08-09 NOTE — Progress Notes (Signed)
  Echocardiogram 2D Echocardiogram has been performed.  Hannah Stein 08/09/2018, 2:47 PM

## 2018-08-09 NOTE — Progress Notes (Signed)
Initial Nutrition Assessment  DOCUMENTATION CODES:   Not applicable  INTERVENTION:    Ensure Enlive po BID, each supplement provides 350 kcal and 20 grams of protein  NUTRITION DIAGNOSIS:   Increased nutrient needs related to chronic illness(COPD) as evidenced by estimated needs.  GOAL:   Patient will meet greater than or equal to 90% of their needs  MONITOR:   PO intake, Supplement acceptance  REASON FOR ASSESSMENT:   Consult COPD Protocol  ASSESSMENT:   85 yo female with PMH of DVT who was admitted with SOB r/t COPD with acute bronchitis and A fib with RVR.  Spoke with patient and her family at bedside. Patient sitting up in chair during RD visit. She ate 100% of breakfast and lunch today. Both meal trays were still in the room. She typically eats 3 meals per day and drinks one or two Ensure supplements per day. Weight stable PTA.   Labs and medications reviewed.   NUTRITION - FOCUSED PHYSICAL EXAM:    Most Recent Value  Orbital Region  No depletion  Upper Arm Region  No depletion  Thoracic and Lumbar Region  No depletion  Buccal Region  Mild depletion  Temple Region  Mild depletion  Clavicle Bone Region  Moderate depletion  Clavicle and Acromion Bone Region  Mild depletion  Scapular Bone Region  No depletion  Dorsal Hand  Moderate depletion  Patellar Region  No depletion  Anterior Thigh Region  No depletion  Posterior Calf Region  Unable to assess  Edema (RD Assessment)  None  Hair  Reviewed  Eyes  Reviewed  Mouth  Reviewed  Skin  Reviewed  Nails  Reviewed       Diet Order:   Diet Order            Diet Heart Room service appropriate? Yes with Assist; Fluid consistency: Thin  Diet effective now              EDUCATION NEEDS:   No education needs have been identified at this time  Skin:  Skin Assessment: Reviewed RN Assessment  Last BM:  3/1  Height:   Ht Readings from Last 1 Encounters:  08/08/18 5\' 3"  (1.6 m)    Weight:   Wt  Readings from Last 1 Encounters:  08/09/18 61.8 kg    Ideal Body Weight:  52.3 kg  BMI:  Body mass index is 24.13 kg/m.  Estimated Nutritional Needs:   Kcal:  1600-1800  Protein:  75-90 gm  Fluid:  1.6 L    Molli Barrows, RD, LDN, [place] Pager [phone] After Hours Pager [phone]

## 2018-08-10 DIAGNOSIS — D72829 Elevated white blood cell count, unspecified: Secondary | ICD-10-CM

## 2018-08-10 DIAGNOSIS — R0602 Shortness of breath: Secondary | ICD-10-CM

## 2018-08-10 LAB — CBC
HCT: 51.1 % — ABNORMAL HIGH (ref 36.0–46.0)
HEMOGLOBIN: 16.1 g/dL — AB (ref 12.0–15.0)
MCH: 27.9 pg (ref 26.0–34.0)
MCHC: 31.5 g/dL (ref 30.0–36.0)
MCV: 88.6 fL (ref 80.0–100.0)
Platelets: 231 10*3/uL (ref 150–400)
RBC: 5.77 MIL/uL — ABNORMAL HIGH (ref 3.87–5.11)
RDW: 13 % (ref 11.5–15.5)
WBC: 13.2 10*3/uL — ABNORMAL HIGH (ref 4.0–10.5)
nRBC: 0 % (ref 0.0–0.2)

## 2018-08-10 MED ORDER — LEVALBUTEROL HCL 0.63 MG/3ML IN NEBU
0.6300 mg | INHALATION_SOLUTION | Freq: Two times a day (BID) | RESPIRATORY_TRACT | Status: DC
  Administered 2018-08-10 – 2018-08-11 (×2): 0.63 mg via RESPIRATORY_TRACT
  Filled 2018-08-10 (×2): qty 3

## 2018-08-10 NOTE — Progress Notes (Signed)
PROGRESS NOTE    Hannah Stein  OXB:[id number] DOB: 06-24-1928 DOA: 08/07/2018 PCP: Kelton Pillar, MD   Brief Narrative:  85 year old with past medical history significant for DVT diagnosed last year who presents complaining of 5 days of shortness of breath, cough congestion wheezing. After nebulizer treatment receiving the ED she converted into A. fib RVR.   Assessment & Plan:   Principal Problem:   COPD with acute bronchitis ([employer]) Active Problems:   Atrial fibrillation with RVR (HCC)   1-acute COPD exacerbation As noted on admission, Patient presented with cough, worsening shortness of breath, bilateral wheezing on lung exam-still present today. Continue with a schedule nebulizer treatment,  IV Solu-Medrol 60q8h c/w guaifenesin,  IV Rocephin. Influenza panel negative.   2-A. fib RVR She was a started on Cardizem drip and converted to dilt 120 daily Was also started on heparin drip on admission Cardiology has been consulted-appreciate their input  3-leukocytosis: flat, Follow, Afebrile. Chest x-ray negative for pneumonia.  4-CKD stage III: Creatinine is stable, GFR 46    DVT prophylaxis: Heparin SQ  Code Status: full code    Code Status Orders  (From admission, onward)         Start     Ordered   08/07/18 2140  Full code  Continuous     08/07/18 2144        Code Status History    This patient has a current code status but no historical code status.     Family Communication: Son at bedside Disposition Plan:   Likely home tomorrow, remain inpatient for continued respiratory support with supplemental O2, scheduled nebulizers and IV steroids in the setting of respiratory failure Consults called: None Admission status: Inpatient   Consultants:   None  Procedures:  Dg Chest Portable 1 View  Result Date: 08/07/2018 CLINICAL DATA:  Shortness of breath EXAM: PORTABLE CHEST 1 VIEW COMPARISON:  None. FINDINGS: The lungs are hyperinflated with  diffuse interstitial coarsening. Cardiomediastinal contours are normal. There is biapical scarring. No focal airspace consolidation or pulmonary edema. IMPRESSION: COPD without acute airspace disease. Electronically Signed   By: Ulyses Jarred M.D.   On: 08/07/2018 19:46     Antimicrobials:   Ceftriaxone day #3   Subjective: Patient reports breathing is improved.  On exam considerably better.  Still short of breath with significant rhonchi mild wheezing, requiring nebulizers, supplemental O2 and IV steroids.  Objective: Vitals:   08/09/18 2136 08/10/18 0503 08/10/18 0806 08/10/18 0938  BP: (!) 147/74 (!) 147/79  (!) 155/64  Pulse: 69 66 81   Resp: 18  18   Temp: 97.7 F (36.5 C) (!) 97.5 F (36.4 C)    TempSrc: Oral Oral    SpO2: 95% 96% 95%   Weight:  61 kg    Height:        Intake/Output Summary (Last 24 hours) at 08/10/2018 1409 Last data filed at 08/10/2018 0900 Gross per 24 hour  Intake 782.22 ml  Output 300 ml  Net 482.22 ml   Filed Weights   08/08/18 1637 08/09/18 0525 08/10/18 0503  Weight: 59.8 kg 61.8 kg 61 kg    Examination:  General exam: Appears calm and comfortable  Respiratory system: Rhonchi bilaterally, wheezing bilaterally,. Cardiovascular system: S1 & S2 heard, RRR. No JVD, murmurs, rubs, gallops or clicks. No pedal edema. Gastrointestinal system: Abdomen is nondistended, soft and nontender. No organomegaly or masses felt. Normal bowel sounds heard. Central nervous system: Alert and oriented. No focal neurological deficits. Extremities:  Symmetric 5 x 5 power. Skin: No rashes, lesions or ulcers Psychiatry: Judgement and insight appear normal. Mood & affect appropriate.     Data Reviewed: I have personally reviewed following labs and imaging studies  CBC: Recent Labs  Lab 08/07/18 1736 08/08/18 0446 08/09/18 0332 08/10/18 0505  WBC 12.0* 12.5* 12.8* 13.2*  NEUTROABS 9.9*  --   --   --   HGB 16.3* 15.2* 15.0 16.1*  HCT 53.6* 48.6* 48.0* 51.1*   MCV 91.3 90.2 89.1 88.6  PLT 209 182 165 970   Basic Metabolic Panel: Recent Labs  Lab 08/07/18 1736 08/09/18 0332  NA 143 140  K 3.9 4.1  CL 107 106  CO2 24 24  GLUCOSE 165* 141*  BUN 24* 38*  CREATININE 1.28* 1.08*  CALCIUM 9.5 8.7*   GFR: Estimated Creatinine Clearance: 29.8 mL/min (A) (by C-G formula based on SCr of 1.08 mg/dL (H)). Liver Function Tests: Recent Labs  Lab 08/07/18 1736  AST 47*  ALT 36  ALKPHOS 95  BILITOT 0.7  PROT 7.3  ALBUMIN 3.4*   No results for input(s): LIPASE, AMYLASE in the last 168 hours. No results for input(s): AMMONIA in the last 168 hours. Coagulation Profile: No results for input(s): INR, PROTIME in the last 168 hours. Cardiac Enzymes: No results for input(s): CKTOTAL, CKMB, CKMBINDEX, TROPONINI in the last 168 hours. BNP (last 3 results) No results for input(s): PROBNP in the last 8760 hours. HbA1C: No results for input(s): HGBA1C in the last 72 hours. CBG: No results for input(s): GLUCAP in the last 168 hours. Lipid Profile: No results for input(s): CHOL, HDL, LDLCALC, TRIG, CHOLHDL, LDLDIRECT in the last 72 hours. Thyroid Function Tests: No results for input(s): TSH, T4TOTAL, FREET4, T3FREE, THYROIDAB in the last 72 hours. Anemia Panel: No results for input(s): VITAMINB12, FOLATE, FERRITIN, TIBC, IRON, RETICCTPCT in the last 72 hours. Sepsis Labs: No results for input(s): PROCALCITON, LATICACIDVEN in the last 168 hours.  No results found for this or any previous visit (from the past 240 hour(s)).       Radiology Studies: No results found.      Scheduled Meds: . budesonide (PULMICORT) nebulizer solution  0.5 mg Nebulization BID  . diltiazem  120 mg Oral Daily  . feeding supplement (ENSURE ENLIVE)  237 mL Oral BID BM  . guaiFENesin  600 mg Oral BID  . levalbuterol  0.63 mg Nebulization BID  . methylPREDNISolone (SOLU-MEDROL) injection  60 mg Intravenous Q8H  . rivaroxaban  15 mg Oral Q supper  . umeclidinium  bromide  1 puff Inhalation Daily   Continuous Infusions: . cefTRIAXone (ROCEPHIN)  IV 1 g (08/09/18 2135)     LOS: 2 days    Time spent: 40 min    Nicolette Bang, MD [facility]  If 7PM-7AM, please contact night-coverage  08/10/2018, 2:09 PM

## 2018-08-10 NOTE — Progress Notes (Signed)
Progress Note  Patient Name: Hannah Stein Date of Encounter: 08/10/2018  Primary Cardiologist: Hannah Dresser, MD (New to [facility])  Subjective   No significant overnight events. Patient states she is feeling better today compared to yesterday. Breathing and cough have improved. No chest pain or palpitations.   Inpatient Medications    Scheduled Meds: . budesonide (PULMICORT) nebulizer solution  0.5 mg Nebulization BID  . diltiazem  120 mg Oral Daily  . feeding supplement (ENSURE ENLIVE)  237 mL Oral BID BM  . guaiFENesin  600 mg Oral BID  . levalbuterol  0.63 mg Nebulization BID  . methylPREDNISolone (SOLU-MEDROL) injection  60 mg Intravenous Q8H  . rivaroxaban  15 mg Oral Q supper  . umeclidinium bromide  1 puff Inhalation Daily   Continuous Infusions: . cefTRIAXone (ROCEPHIN)  IV 1 g (08/09/18 2135)   PRN Meds: benzonatate   Vital Signs    Vitals:   08/09/18 2136 08/10/18 0503 08/10/18 0806 08/10/18 0938  BP: (!) 147/74 (!) 147/79  (!) 155/64  Pulse: 69 66 81   Resp: 18  18   Temp: 97.7 F (36.5 C) (!) 97.5 F (36.4 C)    TempSrc: Oral Oral    SpO2: 95% 96% 95%   Weight:  61 kg    Height:        Intake/Output Summary (Last 24 hours) at 08/10/2018 1225 Last data filed at 08/10/2018 0900 Gross per 24 hour  Intake 1022.22 ml  Output 300 ml  Net 722.22 ml   Filed Weights   08/08/18 1637 08/09/18 0525 08/10/18 0503  Weight: 59.8 kg 61.8 kg 61 kg    Telemetry    Sinus rhythm with heart rates in the high 50's to 90's. Short burst of atrial fibrillation last night but none since. - Personally Reviewed  ECG    No new ECG tracing today. - Personally Reviewed  Physical Exam   GEN: Caucasian female resting comfortably. Alert and in no acute distress.   Neck: Supple.  Cardiac: RRR. No murmurs, gallops, or rubs. Radial pulses 2+ and equal bilaterally.  Respiratory: No significant increased work of breathing. Scattered expiratory wheeze. No rhonchi or  rales appreciated.  GI: Abdomen soft, non-distended, and non-tender. Bowel sounds present. MS: No lower extremity edema. No deformity. Skin: Warm and dry. Neuro:  No focal deficits. Psych: Normal affect. Responds appropriately.  Labs    Chemistry Recent Labs  Lab 08/07/18 1736 08/09/18 0332  NA 143 140  K 3.9 4.1  CL 107 106  CO2 24 24  GLUCOSE 165* 141*  BUN 24* 38*  CREATININE 1.28* 1.08*  CALCIUM 9.5 8.7*  PROT 7.3  --   ALBUMIN 3.4*  --   AST 47*  --   ALT 36  --   ALKPHOS 95  --   BILITOT 0.7  --   GFRNONAA 37* 46*  GFRAA 43* 53*  ANIONGAP 12 10     Hematology Recent Labs  Lab 08/08/18 0446 08/09/18 0332 08/10/18 0505  WBC 12.5* 12.8* 13.2*  RBC 5.39* 5.39* 5.77*  HGB 15.2* 15.0 16.1*  HCT 48.6* 48.0* 51.1*  MCV 90.2 89.1 88.6  MCH 28.2 27.8 27.9  MCHC 31.3 31.3 31.5  RDW 13.2 13.0 13.0  PLT 182 165 231    Cardiac EnzymesNo results for input(s): TROPONINI in the last 168 hours. No results for input(s): TROPIPOC in the last 168 hours.   BNPNo results for input(s): BNP, PROBNP in the last 168 hours.  DDimer No results for input(s): DDIMER in the last 168 hours.   Radiology    No results found.  Cardiac Studies   Echocardiogram 08/09/2018:  1. The left ventricle has normal systolic function with an ejection fraction of 60-65%. The cavity size was normal. There is mildly increased left ventricular wall thickness. Left ventricular diastology could not be evaluated secondary to atrial  fibrillation. No evidence of left ventricular regional wall motion abnormalities.  2. The right ventricle has normal systolic function. The cavity was mildly enlarged. There is no increase in right ventricular wall thickness.  3. The mitral valve is normal in structure.  4. The tricuspid valve is normal in structure.  5. The aortic valve is normal in structure.  6. The aortic root and ascending aorta are normal in size and structure.  7. No evidence of left  ventricular regional wall motion abnormalities.  Summary: LVEF 60-65%, mild LVH, normal wall motion, normal biatrial size, mild TR, RVSP 44 mmHg (mild pulmonary hypertension), normal IVC.  Patient Profile   Ms. Hannah Stein is a 85 year old female with a history of DVT but no other significant past medical history, who is being seen today for the evaluation of atrial fibrillation with RVR at the request of Dr. Tyrell Stein.  Assessment & Plan    New Onset Atrial Fibrillation with RVR - Patient currently in sinus rhythm. Per review of telemetry, looks like she had a short burst of atrial fibrillation last night but none since. Rates well controlled.  - Echo showed LVEF of 60-65% with no regional wall motion abnormalities. Left atrium normal in size.  - Continue Cardizem CD 120mg  daily (avoiding beta blockers with wheezing). - Continue chronic anticoagulation with Xarelto. Given CrCl, will be on reduced dose of 15mg  daily.   Acute Bronchitis - Management per primary team.   For questions or updates, please contact [place] Please consult [email or web address] for contact info under Cardiology/STEMI.      Signed, Hannah Mclean, PA-C  08/10/2018, 12:25 PM

## 2018-08-10 NOTE — Plan of Care (Signed)
  Problem: Education: Goal: Knowledge of General Education information will improve Description Including pain rating scale, medication(s)/side effects and non-pharmacologic comfort measures Outcome: Progressing   

## 2018-08-11 MED ORDER — AZITHROMYCIN 250 MG PO TABS: ORAL_TABLET | ORAL | 0 refills | Status: AC

## 2018-08-11 MED ORDER — DILTIAZEM HCL ER COATED BEADS 120 MG PO CP24: 120.0000 mg | ORAL_CAPSULE | Freq: Every day | ORAL | 0 refills | Status: DC

## 2018-08-11 MED ORDER — SODIUM CHLORIDE 0.9 % IV SOLN
1.0000 g | INTRAVENOUS | Status: AC
  Administered 2018-08-11: 1 g via INTRAVENOUS
  Filled 2018-08-11: qty 10

## 2018-08-11 MED ORDER — ALBUTEROL SULFATE HFA 108 (90 BASE) MCG/ACT IN AERS: 2.0000 | INHALATION_SPRAY | Freq: Four times a day (QID) | RESPIRATORY_TRACT | 2 refills | Status: DC | PRN

## 2018-08-11 MED ORDER — RIVAROXABAN 15 MG PO TABS: 15.0000 mg | ORAL_TABLET | Freq: Every day | ORAL | 0 refills | Status: DC

## 2018-08-11 MED ORDER — BENZONATATE 200 MG PO CAPS: 200.0000 mg | ORAL_CAPSULE | Freq: Three times a day (TID) | ORAL | 0 refills | Status: DC | PRN

## 2018-08-11 MED ORDER — GUAIFENESIN ER 600 MG PO TB12: 600.0000 mg | ORAL_TABLET | Freq: Two times a day (BID) | ORAL | 0 refills | Status: AC

## 2018-08-11 MED ORDER — PREDNISONE 10 MG (21) PO TBPK: ORAL_TABLET | ORAL | 0 refills | Status: DC

## 2018-08-11 NOTE — Progress Notes (Addendum)
Physical Therapy Treatment Patient Details Name: Hannah Stein MRN: [id number] DOB: 08/30/1928 Today's Date: 08/11/2018    History of Present Illness Patient is 85 y/o female admitted 08/07/18 with worsening cough, congestion, and wheezing secondary to COPD and acute bronchitis. Per notes, patient was NSR initially, but converted into A.Fib RVR (new onset) with HR 150s-190s. PMH includes DVT.    PT Comments    Pt progressing with mobility. Mod indep with transfers using RW; ambulatory with RW at supervision-level. SpO2 >/88% on RA with mobility. Pt planning for d/c home today. Recommend use of RW initially at home for added stability, in addition to follow up with HHPT serivces. Daughter present for education. Pt has met short-term PT goals; all questions answered. Will d/c acute PT.    Follow Up Recommendations  Home health PT;Supervision for mobility/OOB     Equipment Recommendations  Rolling walker with 5" wheels    Recommendations for Other Services       Precautions / Restrictions Precautions Precautions: None Restrictions Weight Bearing Restrictions: No    Mobility  Bed Mobility Overal bed mobility: Independent                Transfers Overall transfer level: Modified independent Equipment used: Rolling walker (2 wheeled) Transfers: Sit to/from Stand              Ambulation/Gait Ambulation/Gait assistance: Supervision Gait Distance (Feet): 250 Feet Assistive device: Rolling walker (2 wheeled) Gait Pattern/deviations: Step-through pattern;Decreased stride length Gait velocity: Decreased Gait velocity interpretation: 1.31 - 2.62 ft/sec, indicative of limited community ambulator General Gait Details: Required initial cue to slow gait speed; progressing to slow, steady gait with RW, supervision for safety. SpO2 >/88% on RA. Daughter present for ambulation   Stairs             Wheelchair Mobility    Modified Rankin (Stroke Patients Only)        Balance Overall balance assessment: Needs assistance Sitting-balance support: No upper extremity supported;Feet supported Sitting balance-Leahy Scale: Good       Standing balance-Leahy Scale: Fair Standing balance comment: Can static stand without UE support                            Cognition Arousal/Alertness: Awake/alert Behavior During Therapy: WFL for tasks assessed/performed Overall Cognitive Status: Within Functional Limits for tasks assessed                                        Exercises      General Comments        Pertinent Vitals/Pain Pain Assessment: No/denies pain    Home Living                      Prior Function            PT Goals (current goals can now be found in the care plan section) Acute Rehab PT Goals Patient Stated Goal: go home healthy PT Goal Formulation: With patient/family Time For Goal Achievement: 08/22/18 Potential to Achieve Goals: Good Progress towards PT goals: Progressing toward goals    Frequency    Min 3X/week      PT Plan Current plan remains appropriate    Co-evaluation              AM-PAC PT "6 Clicks" Mobility  Outcome Measure  Help needed turning from your back to your side while in a flat bed without using bedrails?: None Help needed moving from lying on your back to sitting on the side of a flat bed without using bedrails?: None Help needed moving to and from a bed to a chair (including a wheelchair)?: None Help needed standing up from a chair using your arms (e.g., wheelchair or bedside chair)?: A Little Help needed to walk in hospital room?: A Little Help needed climbing 3-5 steps with a railing? : A Little 6 Click Score: 21    End of Session Equipment Utilized During Treatment: Gait belt Activity Tolerance: Patient tolerated treatment well Patient left: in bed;with call bell/phone within reach;with nursing/sitter in room;with family/visitor  present Nurse Communication: Mobility status PT Visit Diagnosis: Other abnormalities of gait and mobility (R26.89);Muscle weakness (generalized) (M62.81)     Time: 8115-7262 PT Time Calculation (min) (ACUTE ONLY): 10 min  Charges:  $Self Care/Home Management: [place], PT, DPT Acute Rehabilitation Services  Pager [phone] Office [place] 08/11/2018, 3:15 PM

## 2018-08-11 NOTE — Progress Notes (Signed)
SATURATION QUALIFICATIONS: (This note is used to comply with regulatory documentation for home oxygen)  Patient Saturations on Room Air at Rest = 97%  Patient Saturations on Room Air while Ambulating = 88-93%  Pt currently does not require supplemental oxygen to maintain SpO2 >/88% on RA.  Mabeline Caras, PT, DPT Acute Rehabilitation Services  Pager [phone] Office [phone]

## 2018-08-11 NOTE — Discharge Summary (Signed)
Physician Discharge Summary  Hannah Stein JKK:[id number] DOB: [birth year] DOA: 08/07/2018  PCP: Kelton Pillar, MD  Admit date: 08/07/2018 Discharge date: 08/11/2018  Admitted From: Inpatient Disposition: home  Recommendations for Outpatient Follow-up:  1. Follow up with PCP in 1-2 weeks 2. Please obtain BMP/CBC in one week 3. Please follow up on the following pending results:none  Home Health:No Equipment/Devices:02 2L continuous  Discharge Condition:Stable CODE STATUS:Full code Diet recommendation: Regular healthy diet  Brief/Interim Summary: This is an 85 year old white female with a past medical history notable for DVT diagnosed last year who presented complaining of 5 days of shortness of breath, cough congestion and wheezing.  In the ER she received a nebulizer treatment and converted to A. fib with rapid regular response for she was placed on diltiazem drip with appropriate response.  She was subsequently seen by the hospital service admitted for further evaluation for treatment of COPD with acute bronchitis and A. fib RVR.  Hospital course as below:  Acute COPD exacerbation as noted on mission patient presented with a cough worsening shortness of breath and bilateral wheezing on lung exam.  Denies any history of recent smoking denies any history of COPD.  She did respond nicely to IV steroids as well as scheduled and as needed nebulizer treatments guaifenesin and IV antibiotics.  Her flu panel was negative.  Patient be discharged home after rapid recovery on steroids with taper, Z-Pak, and albuterol inhaler.  She qualify for oxygen with O2 sats of 87% with ambulation on room air.  Anticipate patient will rapidly recover and will not need this for an extended period of time.  A. fib RVR.  Patient was initially started on Cardizem drip as noted and converted to normal sinus rhythm.  She was placed on p.o. diltiazem.  She was seen by cardiology in consultation also.  Patient was placed on  heparin drip for stroke prophylaxis and subsequently [facility] medications were reviewed with xarelto reduced to 15 mg because of age and renal function.  Leukocytosis: Patient also was noted to have a mild leukocytosis with white count  was flat, chest x-ray was negative for pneumonia, patient has been afebrile, this is consistent with a mild bronchitis with treatment as outlined above.  CKD stage III creatinine is stable.  We avoid nephrotoxic agents.  Patient be discharged home with outpatient follow-up with PCP.  Discharge Diagnoses:  Principal Problem:   COPD with acute bronchitis ([place]) Active Problems:   Atrial fibrillation with RVR ([facility])   SOB (shortness of breath)   Leukocytosis    Discharge Instructions  Discharge Instructions    Call MD for:   Complete by:  As directed    Any acute change in medical condition   Diet - low sodium heart healthy   Complete by:  As directed    Increase activity slowly   Complete by:  As directed      Allergies as of 08/11/2018   No Known Allergies     Medication List    STOP taking these medications   Rivaroxaban 15 & 20 MG Tbpk Replaced by:  Rivaroxaban 15 MG Tabs tablet     TAKE these medications   acetaminophen 325 MG tablet Commonly known as:  TYLENOL Take 325 mg by mouth every 6 (six) hours as needed for headache (pain).   albuterol 108 (90 Base) MCG/ACT inhaler Commonly known as:  PROVENTIL HFA;VENTOLIN HFA Inhale 2 puffs into the lungs every 6 (six) hours as needed for wheezing or shortness of  breath.   azithromycin 250 MG tablet Commonly known as:  Zithromax Z-Pak Take 2 tablets (500 mg) on  Day 1,  followed by 1 tablet (250 mg) once daily on Days 2 through 5.   benzonatate 200 MG capsule Commonly known as:  TESSALON Take 1 capsule (200 mg total) by mouth 3 (three) times daily as needed for cough.   calcium carbonate 500 MG chewable tablet Commonly known as:  TUMS - dosed in mg elemental calcium Chew 2 tablets by mouth  daily after supper.   COUGH DROPS MT Use as directed 1 lozenge in the mouth or throat 2 (two) times daily as needed (cough).   diltiazem 120 MG 24 hr capsule Commonly known as:  CARDIZEM CD Take 1 capsule (120 mg total) by mouth daily. Start taking on:  August 12, 2018   GLUCOSAMINE CHONDR COMPLEX PO Take 1 tablet by mouth daily.   guaiFENesin 600 MG 12 hr tablet Commonly known as:  MUCINEX Take 1 tablet (600 mg total) by mouth 2 (two) times daily for 5 days.   predniSONE 10 MG (21) Tbpk tablet Commonly known as:  STERAPRED UNI-PAK 21 TAB Per package insert   Rivaroxaban 15 MG Tabs tablet Commonly known as:  XARELTO Take 1 tablet (15 mg total) by mouth daily with supper. Replaces:  Rivaroxaban 15 & 20 MG Tbpk   ROBITUSSIN DM PO Take 5 mLs by mouth 2 (two) times daily as needed (cough).   vitamin B-12 500 MCG tablet Commonly known as:  CYANOCOBALAMIN Take 500 mcg by mouth daily.   Vitamin D (Cholecalciferol) 50 MCG (2000 UT) Caps Take 2,000 Units by mouth daily.   vitamin E 400 UNIT capsule Take 400 Units by mouth daily.      Follow-up Information    Buford Dresser, MD Follow up.   Specialty:  Cardiology Why:  Cardiology hospital follow up on 09/05/2018 at 11:00. Please arrive 15 minutes early for check in.  Contact information: [address] [place] [address] [phone]          No Known Allergies  Consultations:  cardiology   Procedures/Studies: Dg Chest Portable 1 View  Result Date: 08/07/2018 CLINICAL DATA:  Shortness of breath EXAM: PORTABLE CHEST 1 VIEW COMPARISON:  None. FINDINGS: The lungs are hyperinflated with diffuse interstitial coarsening. Cardiomediastinal contours are normal. There is biapical scarring. No focal airspace consolidation or pulmonary edema. IMPRESSION: COPD without acute airspace disease. Electronically Signed   By: Ulyses Jarred M.D.   On: 08/07/2018 19:46       Subjective: Doing well today,  feels ready for discharge home  Discharge Exam: Vitals:   08/11/18 0559 08/11/18 1000  BP: (!) 136/59 140/68  Pulse: 62   Resp:    Temp: (!) 97.4 F (36.3 C)   SpO2: 94%    Vitals:   08/10/18 2007 08/10/18 2136 08/11/18 0559 08/11/18 1000  BP:  (!) 148/72 (!) 136/59 140/68  Pulse:  76 62   Resp:      Temp:  97.6 F (36.4 C) (!) 97.4 F (36.3 C)   TempSrc:  Oral Oral   SpO2: 97% 92% 94%   Weight:   61.1 kg   Height:        General: Pt is alert, awake, not in acute distress Cardiovascular: RRR, S1/S2 +, no rubs, no gallops Respiratory: CTA bilaterally, no wheezing, very mild rhonchi Abdominal: Soft, NT, ND, bowel sounds + Extremities: no edema, no cyanosis    The results  of significant diagnostics from this hospitalization (including imaging, microbiology, ancillary and laboratory) are listed below for reference.     Microbiology: No results found for this or any previous visit (from the past 240 hour(s)).   Labs: BNP (last 3 results) No results for input(s): BNP in the last 8760 hours. Basic Metabolic Panel: Recent Labs  Lab 08/07/18 1736 08/09/18 0332  NA 143 140  K 3.9 4.1  CL 107 106  CO2 24 24  GLUCOSE 165* 141*  BUN 24* 38*  CREATININE 1.28* 1.08*  CALCIUM 9.5 8.7*   Liver Function Tests: Recent Labs  Lab 08/07/18 1736  AST 47*  ALT 36  ALKPHOS 95  BILITOT 0.7  PROT 7.3  ALBUMIN 3.4*   No results for input(s): LIPASE, AMYLASE in the last 168 hours. No results for input(s): AMMONIA in the last 168 hours. CBC: Recent Labs  Lab 08/07/18 1736 08/08/18 0446 08/09/18 0332 08/10/18 0505  WBC 12.0* 12.5* 12.8* 13.2*  NEUTROABS 9.9*  --   --   --   HGB 16.3* 15.2* 15.0 16.1*  HCT 53.6* 48.6* 48.0* 51.1*  MCV 91.3 90.2 89.1 88.6  PLT 209 182 165 231   Cardiac Enzymes: No results for input(s): CKTOTAL, CKMB, CKMBINDEX, TROPONINI in the last 168 hours. BNP: Invalid input(s): POCBNP CBG: No results for input(s): GLUCAP in the last 168  hours. D-Dimer No results for input(s): DDIMER in the last 72 hours. Hgb A1c No results for input(s): HGBA1C in the last 72 hours. Lipid Profile No results for input(s): CHOL, HDL, LDLCALC, TRIG, CHOLHDL, LDLDIRECT in the last 72 hours. Thyroid function studies No results for input(s): TSH, T4TOTAL, T3FREE, THYROIDAB in the last 72 hours.  Invalid input(s): FREET3 Anemia work up No results for input(s): VITAMINB12, FOLATE, FERRITIN, TIBC, IRON, RETICCTPCT in the last 72 hours. Urinalysis No results found for: COLORURINE, APPEARANCEUR, LABSPEC, [place], GLUCOSEU, HGBUR, BILIRUBINUR, KETONESUR, PROTEINUR, UROBILINOGEN, NITRITE, LEUKOCYTESUR Sepsis Labs Invalid input(s): PROCALCITONIN,  WBC,  LACTICIDVEN Microbiology No results found for this or any previous visit (from the past 240 hour(s)).   Time coordinating discharge: Over 30 minutes  SIGNED:   Nicolette Bang, MD  [facility] 08/11/2018, 1:40 PM Pager   If 7PM-7AM, please contact night-coverage [email or web address] Password [email or web address]

## 2018-08-11 NOTE — Care Management Important Message (Signed)
Important Message  Patient Details  Name: Hannah Stein MRN: [id number] Date of Birth: April 12, 1929   Medicare Important Message Given:  Yes    Daven Pinckney P Otis 08/11/2018, 11:19 AM

## 2018-08-11 NOTE — Progress Notes (Signed)
Progress Note  Patient Name: Hannah Stein Date of Encounter: 08/11/2018  Primary Cardiologist: Buford Dresser, MD (New to [facility])  Subjective   Feeling much improved. O2 levels dropped to 88 off nasal cannula. On 1 L on my interview and hovering in the low 90s. Plan is to ambulate and see how her oxygen levels do. Potentially home today, possibly with O2 (likely temporarily). Did not feel her brief episode of afib this AM.  Inpatient Medications    Scheduled Meds: . budesonide (PULMICORT) nebulizer solution  0.5 mg Nebulization BID  . diltiazem  120 mg Oral Daily  . feeding supplement (ENSURE ENLIVE)  237 mL Oral BID BM  . guaiFENesin  600 mg Oral BID  . levalbuterol  0.63 mg Nebulization BID  . methylPREDNISolone (SOLU-MEDROL) injection  60 mg Intravenous Q8H  . rivaroxaban  15 mg Oral Q supper  . umeclidinium bromide  1 puff Inhalation Daily   Continuous Infusions: . cefTRIAXone (ROCEPHIN)  IV 1 g (08/10/18 2121)   PRN Meds: benzonatate   Vital Signs    Vitals:   08/10/18 1408 08/10/18 2007 08/10/18 2136 08/11/18 0559  BP: (!) 147/62  (!) 148/72 (!) 136/59  Pulse: 75  76 62  Resp: (!) 23     Temp: 97.8 F (36.6 C)  97.6 F (36.4 C) (!) 97.4 F (36.3 C)  TempSrc: Oral  Oral Oral  SpO2: 97% 97% 92% 94%  Weight:    61.1 kg  Height:        Intake/Output Summary (Last 24 hours) at 08/11/2018 0925 Last data filed at 08/11/2018 0600 Gross per 24 hour  Intake 597.84 ml  Output 650 ml  Net -52.16 ml   Filed Weights   08/09/18 0525 08/10/18 0503 08/11/18 0559  Weight: 61.8 kg 61 kg 61.1 kg    Telemetry    Predominantly sinus rhythm, but did have very brief atrial fibrillation around 8:55 AM into the 140s.. - Personally Reviewed  ECG    No new ECG since 3/2 - Personally Reviewed  Physical Exam   GEN: Caucasian female resting comfortably. Alert and in no acute distress.   Neck: Supple.  Cardiac: RRR. No murmurs, gallops, or rubs. Radial pulses 2+ and  equal bilaterally.  Respiratory: Lungs much improved, clear bilaterally, no wheezing. GI: Abdomen soft, non-distended, and non-tender. Bowel sounds present. MS: No lower extremity edema. No deformity. Skin: Warm and dry. Neuro:  No focal deficits. Psych: Normal affect. Responds appropriately.  Labs    Chemistry Recent Labs  Lab 08/07/18 1736 08/09/18 0332  NA 143 140  K 3.9 4.1  CL 107 106  CO2 24 24  GLUCOSE 165* 141*  BUN 24* 38*  CREATININE 1.28* 1.08*  CALCIUM 9.5 8.7*  PROT 7.3  --   ALBUMIN 3.4*  --   AST 47*  --   ALT 36  --   ALKPHOS 95  --   BILITOT 0.7  --   GFRNONAA 37* 46*  GFRAA 43* 53*  ANIONGAP 12 10     Hematology Recent Labs  Lab 08/08/18 0446 08/09/18 0332 08/10/18 0505  WBC 12.5* 12.8* 13.2*  RBC 5.39* 5.39* 5.77*  HGB 15.2* 15.0 16.1*  HCT 48.6* 48.0* 51.1*  MCV 90.2 89.1 88.6  MCH 28.2 27.8 27.9  MCHC 31.3 31.3 31.5  RDW 13.2 13.0 13.0  PLT 182 165 231    Cardiac EnzymesNo results for input(s): TROPONINI in the last 168 hours. No results for input(s): TROPIPOC in the  last 168 hours.   BNPNo results for input(s): BNP, PROBNP in the last 168 hours.   DDimer No results for input(s): DDIMER in the last 168 hours.   Radiology    No results found.  Cardiac Studies   Echocardiogram 08/09/2018:  1. The left ventricle has normal systolic function with an ejection fraction of 60-65%. The cavity size was normal. There is mildly increased left ventricular wall thickness. Left ventricular diastology could not be evaluated secondary to atrial  fibrillation. No evidence of left ventricular regional wall motion abnormalities.  2. The right ventricle has normal systolic function. The cavity was mildly enlarged. There is no increase in right ventricular wall thickness.  3. The mitral valve is normal in structure.  4. The tricuspid valve is normal in structure.  5. The aortic valve is normal in structure.  6. The aortic root and ascending aorta  are normal in size and structure.  7. No evidence of left ventricular regional wall motion abnormalities.  Summary: LVEF 60-65%, mild LVH, normal wall motion, normal biatrial size, mild TR, RVSP 44 mmHg (mild pulmonary hypertension), normal IVC.  Patient Profile   Hannah Stein is a 85 year old female with a history of DVT who presented with URI symptoms, developed very rapid atrial fibrillation with RVR. We are following her for management of her new onset atrial fibrillation at the request of Dr. Regalado/Dr. Wyonia Hough.  Assessment & Plan    New Onset Atrial Fibrillation with RVR - Patient currently in sinus rhythm. Per review of telemetry, looks like she had a short burst of atrial fibrillation last night but none since. Rates well controlled.  - Echo showed LVEF of 60-65% with no regional wall motion abnormalities. Left atrium normal in size.  - Continue Cardizem CD 120mg  daily (avoiding beta blockers with wheezing). See my comments from yesterday--we will at least plan to continue this until follow up to given her some protection from tachycardia while she is recovering from her respiratory illness. Given that she went almost 200 bpm in her initial atrial fibrillation, would like to make sure she doesn't have very rapid RVR when she does have afib. She appears to be tolerating this dose well in sinus rhythm as well. - Continue chronic anticoagulation with Xarelto. Given CrCl, will be on reduced dose of 15mg  daily. Again, we discussed the pros/cons of long term anticoagulation. She was on xarelto in the past and tolerated it well. We also discussed that once incidence of atrial fib does suggest that a person is predisposed to having additional episodes in the future. Chadsvasc=5  Acute Bronchitis - Management per primary team.   [facility] will sign off.   Medication Recommendations:  Continue diltiazem and rivaroxaban as ordered. Other recommendations (labs, testing, etc):   none Follow up as an outpatient:  I will have her see me back in clinic in several weeks.  For questions or updates, please contact [place] Please consult [email or web address] for contact info under Cardiology/STEMI.   Signed, Buford Dresser, MD  08/11/2018, 9:25 AM

## 2018-08-12 DIAGNOSIS — J209 Acute bronchitis, unspecified: Secondary | ICD-10-CM | POA: Diagnosis not present

## 2018-08-12 DIAGNOSIS — N183 Chronic kidney disease, stage 3 (moderate): Secondary | ICD-10-CM | POA: Diagnosis not present

## 2018-08-12 DIAGNOSIS — I480 Paroxysmal atrial fibrillation: Secondary | ICD-10-CM | POA: Diagnosis not present

## 2018-08-12 DIAGNOSIS — J441 Chronic obstructive pulmonary disease with (acute) exacerbation: Secondary | ICD-10-CM | POA: Diagnosis not present

## 2018-08-13 DIAGNOSIS — N183 Chronic kidney disease, stage 3 (moderate): Secondary | ICD-10-CM | POA: Diagnosis not present

## 2018-08-13 DIAGNOSIS — I480 Paroxysmal atrial fibrillation: Secondary | ICD-10-CM | POA: Diagnosis not present

## 2018-08-13 DIAGNOSIS — Z9181 History of falling: Secondary | ICD-10-CM | POA: Diagnosis not present

## 2018-08-13 DIAGNOSIS — J209 Acute bronchitis, unspecified: Secondary | ICD-10-CM | POA: Diagnosis not present

## 2018-08-13 DIAGNOSIS — Z87891 Personal history of nicotine dependence: Secondary | ICD-10-CM | POA: Diagnosis not present

## 2018-08-13 DIAGNOSIS — J440 Chronic obstructive pulmonary disease with acute lower respiratory infection: Secondary | ICD-10-CM | POA: Diagnosis not present

## 2018-08-13 DIAGNOSIS — Z7901 Long term (current) use of anticoagulants: Secondary | ICD-10-CM | POA: Diagnosis not present

## 2018-08-13 DIAGNOSIS — Z86718 Personal history of other venous thrombosis and embolism: Secondary | ICD-10-CM | POA: Diagnosis not present

## 2018-08-13 DIAGNOSIS — J441 Chronic obstructive pulmonary disease with (acute) exacerbation: Secondary | ICD-10-CM | POA: Diagnosis not present

## 2018-08-28 DIAGNOSIS — J449 Chronic obstructive pulmonary disease, unspecified: Secondary | ICD-10-CM | POA: Diagnosis not present

## 2018-08-28 DIAGNOSIS — I4891 Unspecified atrial fibrillation: Secondary | ICD-10-CM | POA: Diagnosis not present

## 2018-09-04 ENCOUNTER — Telehealth: Payer: Self-pay | Admitting: Cardiology

## 2018-09-04 ENCOUNTER — Other Ambulatory Visit: Payer: Self-pay

## 2018-09-04 NOTE — Telephone Encounter (Signed)
Appointment changed to virtual phone visit.

## 2018-09-04 NOTE — Telephone Encounter (Signed)
° °  Daughter calling [phone], to verify if patient should keep appointment on 3/31.

## 2018-09-04 NOTE — Telephone Encounter (Signed)
Spoke with pt dtr, patient does not have a smart phone so would not be able to do a video call but a telephone call at 11 am tomorrow will be fine. Will make dr Harrell Gave and Lars Mage aware

## 2018-09-05 ENCOUNTER — Telehealth (INDEPENDENT_AMBULATORY_CARE_PROVIDER_SITE_OTHER): Payer: Medicare Other | Admitting: Cardiology

## 2018-09-05 ENCOUNTER — Other Ambulatory Visit: Payer: Self-pay

## 2018-09-05 VITALS — HR 93

## 2018-09-05 DIAGNOSIS — I480 Paroxysmal atrial fibrillation: Secondary | ICD-10-CM | POA: Diagnosis not present

## 2018-09-05 MED ORDER — DILTIAZEM HCL ER COATED BEADS 120 MG PO CP24: 120.0000 mg | ORAL_CAPSULE | Freq: Every day | ORAL | 3 refills | Status: DC

## 2018-09-05 MED ORDER — RIVAROXABAN 15 MG PO TABS: 15.0000 mg | ORAL_TABLET | Freq: Every day | ORAL | 3 refills | Status: DC

## 2018-09-05 NOTE — Patient Instructions (Signed)

## 2018-09-05 NOTE — Progress Notes (Signed)
Virtual Visit via Telephone Note    Evaluation Performed:  Follow-up visit  This visit type was conducted due to national recommendations for restrictions regarding the COVID-19 Pandemic (e.g. social distancing).  This format is felt to be most appropriate for this patient at this time.  All issues noted in this document were discussed and addressed.  No physical exam was performed (except for noted visual exam findings with Video Visits).  Please refer to the patient's chart (MyChart message for video visits and phone note for telephone visits) for the patient's consent to telehealth for [facility].  Date:  09/05/2018   ID:  Hannah Stein, DOB 1928/08/11, MRN [id number]  Patient Location:  [address] [place], [place] [address]  Provider location:   [place], [place] Office  PCP:  Kelton Pillar, MD  Cardiologist:  Buford Dresser, MD  Chief Complaint:  Follow up of atrial fibrillation  History of Present Illness:    Hannah Stein is a 85 y.o. female who presents via audio/video conferencing for a telehealth visit today.   The patient does not have symptoms concerning for COVID-19 infection (fever, chills, cough, or new shortness of breath).   I met her in the hospital and treated her for new afib RVR in the setting of URI. She is doing very well at home. She never used home O2. She was working with physical therapy, and she did so well that she was released from treatment. She is now ambulating some and occasionally uses a walker. She has a pulse ox at home, and recently her O2 sats have been in the mid 90s with heart rates in the 60s.  Denies chest pain, shortness of breath at rest or with normal exertion. No PND, orthopnea, LE edema or unexpected weight gain. No syncope or palpitations.  She is tolerating both diltiazem and rivaroxaban. The DOAC is expensive, but she is fine continuing it. We will send refills today.  She is remaining  at home to avoid exposure to coronavirus. Her daughter and son in law rotate taking care of her, and she does not see other visitors. She feels almost better now than she did before she got sick. No falls, no bleeding issues. No infectious symptoms.   Prior CV studies:   The following studies were reviewed today: Prior hospital notes and studies.  Past Medical History:  Diagnosis Date  . DVT, lower extremity [facility])    Past Surgical History:  Procedure Laterality Date  . THROAT SURGERY       Current Meds  Medication Sig  . albuterol (PROVENTIL HFA;VENTOLIN HFA) 108 (90 Base) MCG/ACT inhaler Inhale 2 puffs into the lungs every 6 (six) hours as needed for wheezing or shortness of breath.  . diltiazem (CARDIZEM CD) 120 MG 24 hr capsule Take 1 capsule (120 mg total) by mouth daily.  . Rivaroxaban (XARELTO) 15 MG TABS tablet Take 1 tablet (15 mg total) by mouth daily with supper.  . vitamin B-12 (CYANOCOBALAMIN) 500 MCG tablet Take 500 mcg by mouth daily.     Allergies:   Patient has no known allergies.   Social History   Tobacco Use  . Smoking status: Former Smoker    Types: Cigarettes    Last attempt to quit: 07/22/1982    Years since quitting: 36.1  . Smokeless tobacco: Never Used  Substance Use Topics  . Alcohol use: No    Frequency: Never  . Drug use: No     Family  Hx: The patient's family history includes CVA in her mother; Heart failure in her mother.  ROS:   Please see the history of present illness.    All other systems reviewed and are negative.   Labs/Other Tests and Data Reviewed:    Recent Labs: 08/07/2018: ALT 36 08/09/2018: BUN 38; Creatinine, Ser 1.08; Potassium 4.1; Sodium 140 08/10/2018: Hemoglobin 16.1; Platelets 231   Recent Lipid Panel No results found for: CHOL, TRIG, HDL, CHOLHDL, LDLCALC, LDLDIRECT  Wt Readings from Last 3 Encounters:  08/11/18 134 lb 11.2 oz (61.1 kg)  07/22/17 132 lb (59.9 kg)  07/14/17 134 lb (60.8 kg)     Objective:     Vital Signs:  Pulse 93   Does not check home BP  ASSESSMENT & PLAN:    Atrial fibrillation (presented with RVR in the hospital in the setting of URI) -echo in hospital with normal EF -paroxsymal in the hospital -now well rate controlled at home, unknown if afib or sinus -continue cardizem, rivaroxaban (reduced dose given age >80 and weight 60 kg). Weight has fluctuated, will need to adjust in office based on office weight at follow up -CHADSVASC=5, even if paroxysmal would continue rivaroxaban if tolerating  COVID-19 Education: The signs and symptoms of COVID-19 were discussed with the patient and how to seek care for testing (follow up with PCP or arrange E-visit).  The importance of social distancing was discussed today.  Patient Risk:   After full review of this patient's clinical status, I feel that they are at least moderate risk at this time.  Time:   Today, I have spent 13 minutes with the patient with telehealth technology discussing symptoms and atrial fibrillation.     Patient Instructions  Medication Instructions:  Your Physician recommend you continue on your current medication as directed.    If you need a refill on your cardiac medications before your next appointment, please call your pharmacy.   Lab work: None  Testing/Procedures: None  Follow-Up: At [facility], you and your health needs are our priority.  As part of our continuing mission to provide you with exceptional heart care, we have created designated Provider Care Teams.  These Care Teams include your primary Cardiologist (physician) and Advanced Practice Providers (APPs -  Physician Assistants and Nurse Practitioners) who all work together to provide you with the care you need, when you need it. You will need a follow up appointment in 6 weeks.  Please call our office 2 months in advance to schedule this appointment.  You may see  , MD or one of the following Advanced Practice  Providers on your designated Care Team:   Rhonda Barrett, PA-C . Kathryn Lawrence, DNP, ANP     Disposition:  Follow up in 6 week(s)  Signed,  , MD  09/05/2018 11:01 AM    [facility] 

## 2018-10-11 ENCOUNTER — Telehealth: Payer: Self-pay | Admitting: Cardiology

## 2018-10-13 ENCOUNTER — Encounter: Payer: Self-pay | Admitting: Cardiology

## 2018-10-13 ENCOUNTER — Telehealth (INDEPENDENT_AMBULATORY_CARE_PROVIDER_SITE_OTHER): Payer: Medicare Other | Admitting: Cardiology

## 2018-10-13 VITALS — Wt 130.0 lb

## 2018-10-13 DIAGNOSIS — I480 Paroxysmal atrial fibrillation: Secondary | ICD-10-CM | POA: Diagnosis not present

## 2018-10-13 DIAGNOSIS — Z7189 Other specified counseling: Secondary | ICD-10-CM | POA: Diagnosis not present

## 2018-10-13 NOTE — Patient Instructions (Signed)
Medication Instructions:  Your Physician recommend you continue on your current medication as directed.    If you need a refill on your cardiac medications before your next appointment, please call your pharmacy.   Lab work: None  Testing/Procedures: None  Follow-Up: Your physician recommends that you schedule a follow-up appointment in 6 months with Dr. Christopher      

## 2018-10-13 NOTE — Progress Notes (Signed)
Virtual Visit via Telephone Note   This visit type was conducted due to national recommendations for restrictions regarding the COVID-19 Pandemic (e.g. social distancing) in an effort to limit this patient's exposure and mitigate transmission in our community.  Due to her co-morbid illnesses, this patient is at least at moderate risk for complications without adequate follow up.  This format is felt to be most appropriate for this patient at this time.  The patient did not have access to video technology/had technical difficulties with video requiring transitioning to audio format only (telephone).  All issues noted in this document were discussed and addressed.  No physical exam could be performed with this format.  Please refer to the patient's chart for her  consent to telehealth for [facility].   Date:  10/13/2018   ID:  Hannah Stein, DOB Jun 15, 1928, MRN [id number]  Patient Location: Home Provider Location: Office  PCP:  Kelton Pillar, MD  Cardiologist:  Buford Dresser, MD  Electrophysiologist:  None   Evaluation Performed:  Follow-Up Visit  Chief Complaint:  Follow up  History of Present Illness:    Hannah Stein is a 85 y.o. female with hospitalization in 08/2018 for atrial fibrillation with RVR in the setting of URI.  The patient does not have symptoms concerning for COVID-19 infection (fever, chills, cough, or new shortness of breath).   Doing very well, can now walk without walker. Breathing is good, O2 sats 94%, hr upper 50s-lows 60s bpm. No issues with bleeding. Hasn't seen any fast heart rates, even when up and moving. Wearing compression stockings, no issues with swelling. Daughter says she is doing better than before she was even in the hospital.  Denies chest pain, shortness of breath at rest or with normal exertion. No PND, orthopnea, LE edema or unexpected weight gain. No syncope or palpitations.   Past Medical History:  Diagnosis Date  . DVT,  lower extremity [facility])    Past Surgical History:  Procedure Laterality Date  . THROAT SURGERY       Current Meds  Medication Sig  . albuterol (PROVENTIL HFA;VENTOLIN HFA) 108 (90 Base) MCG/ACT inhaler Inhale 2 puffs into the lungs every 6 (six) hours as needed for wheezing or shortness of breath.  . diltiazem (CARDIZEM CD) 120 MG 24 hr capsule Take 1 capsule (120 mg total) by mouth daily.  . Rivaroxaban (XARELTO) 15 MG TABS tablet Take 1 tablet (15 mg total) by mouth daily with supper.  . vitamin B-12 (CYANOCOBALAMIN) 500 MCG tablet Take 500 mcg by mouth daily.     Allergies:   Patient has no known allergies.   Social History   Tobacco Use  . Smoking status: Former Smoker    Types: Cigarettes    Last attempt to quit: 07/22/1982    Years since quitting: 36.2  . Smokeless tobacco: Never Used  Substance Use Topics  . Alcohol use: No    Frequency: Never  . Drug use: No     Family Hx: The patient's family history includes CVA in her mother; Heart failure in her mother.  ROS:   Please see the history of present illness.    Constitutional: Negative for chills, fever, night sweats, unintentional weight loss  HENT: Negative for ear pain and hearing loss.   Eyes: Negative for loss of vision and eye pain.  Respiratory: Negative for cough, sputum, wheezing.   Cardiovascular: See HPI. Gastrointestinal: Negative for abdominal pain, melena, and hematochezia.  Genitourinary: Negative for dysuria and hematuria.  Musculoskeletal: Negative for falls and myalgias.  Skin: Negative for itching and rash.  Neurological: Negative for focal weakness, focal sensory changes and loss of consciousness.  Endo/Heme/Allergies: Does not bruise/bleed easily.  All other systems reviewed and are negative.   Prior CV studies:   The following studies were reviewed today: Echo 08/09/2018  Labs/Other Tests and Data Reviewed:    EKG:  An ECG dated 08/07/2018 was personally reviewed today and demonstrated:   atrial fib with RVR  Recent Labs: 08/07/2018: ALT 36 08/09/2018: BUN 38; Creatinine, Ser 1.08; Potassium 4.1; Sodium 140 08/10/2018: Hemoglobin 16.1; Platelets 231   Recent Lipid Panel No results found for: CHOL, TRIG, HDL, CHOLHDL, LDLCALC, LDLDIRECT  Wt Readings from Last 3 Encounters:  10/13/18 130 lb (59 kg)  08/11/18 134 lb 11.2 oz (61.1 kg)  07/22/17 132 lb (59.9 kg)     Objective:    Vital Signs:  Wt 130 lb (59 kg)   BMI 23.03 kg/m    No acute distress over the phone, speaking clearly without respiratory distress.  ASSESSMENT & PLAN:    Atrial fibrillation (presented with RVR in the hospital in the setting of URI 08/2018) -echo in hospital with normal EF -paroxsymal in the hospital -now well rate controlled at home, unknown if afib or sinus but I suspect she is sinus rhythm -continue cardizem, rivaroxaban (reduced dose given age >59 and weight 60 kg). Doing well with these. -CHADSVASC=5, even if paroxysmal would continue rivaroxaban if tolerating  COVID-19 Education: The signs and symptoms of COVID-19 were discussed with the patient and how to seek care for testing (follow up with PCP or arrange E-visit).  The importance of social distancing was discussed today.  Time:   Today, I have spent 15 minutes with the patient with telehealth technology discussing the above problems.    Patient Instructions  Medication Instructions:  Your Physician recommend you continue on your current medication as directed.    If you need a refill on your cardiac medications before your next appointment, please call your pharmacy.   Lab work: None  Testing/Procedures: None  Follow-Up: Your physician recommends that you schedule a follow-up appointment in 6 months with Dr. Harrell Gave.     Medication Adjustments/Labs and Tests Ordered: Current medicines are reviewed at length with the patient today.  Concerns regarding medicines are outlined above.   Tests Ordered: No orders of  the defined types were placed in this encounter.   Medication Changes: No orders of the defined types were placed in this encounter.   Disposition:  Follow up 6 mos  Signed, Buford Dresser, MD  10/13/2018 3:47 PM    [facility]

## 2018-12-05 NOTE — Telephone Encounter (Signed)
Opened in error

## 2018-12-19 DIAGNOSIS — I82401 Acute embolism and thrombosis of unspecified deep veins of right lower extremity: Secondary | ICD-10-CM | POA: Diagnosis not present

## 2018-12-19 DIAGNOSIS — E042 Nontoxic multinodular goiter: Secondary | ICD-10-CM | POA: Diagnosis not present

## 2018-12-19 DIAGNOSIS — I4891 Unspecified atrial fibrillation: Secondary | ICD-10-CM | POA: Diagnosis not present

## 2018-12-19 DIAGNOSIS — Z0000 Encounter for general adult medical examination without abnormal findings: Secondary | ICD-10-CM | POA: Diagnosis not present

## 2019-02-06 DIAGNOSIS — H5213 Myopia, bilateral: Secondary | ICD-10-CM | POA: Diagnosis not present

## 2019-02-20 DIAGNOSIS — E042 Nontoxic multinodular goiter: Secondary | ICD-10-CM | POA: Diagnosis not present

## 2019-02-20 DIAGNOSIS — I4891 Unspecified atrial fibrillation: Secondary | ICD-10-CM | POA: Diagnosis not present

## 2019-02-20 DIAGNOSIS — N183 Chronic kidney disease, stage 3 (moderate): Secondary | ICD-10-CM | POA: Diagnosis not present

## 2019-02-20 DIAGNOSIS — R7301 Impaired fasting glucose: Secondary | ICD-10-CM | POA: Diagnosis not present

## 2019-02-23 DIAGNOSIS — R7301 Impaired fasting glucose: Secondary | ICD-10-CM | POA: Diagnosis not present

## 2019-02-23 DIAGNOSIS — I4891 Unspecified atrial fibrillation: Secondary | ICD-10-CM | POA: Diagnosis not present

## 2019-02-23 DIAGNOSIS — N183 Chronic kidney disease, stage 3 (moderate): Secondary | ICD-10-CM | POA: Diagnosis not present

## 2019-02-23 DIAGNOSIS — E042 Nontoxic multinodular goiter: Secondary | ICD-10-CM | POA: Diagnosis not present

## 2019-04-11 ENCOUNTER — Other Ambulatory Visit: Payer: Self-pay

## 2019-04-11 ENCOUNTER — Encounter: Payer: Self-pay | Admitting: Cardiology

## 2019-04-11 ENCOUNTER — Ambulatory Visit (INDEPENDENT_AMBULATORY_CARE_PROVIDER_SITE_OTHER): Payer: Medicare Other | Admitting: Cardiology

## 2019-04-11 VITALS — BP 126/72 | HR 66 | Temp 96.6°F | Ht 63.0 in | Wt 135.0 lb

## 2019-04-11 DIAGNOSIS — I480 Paroxysmal atrial fibrillation: Secondary | ICD-10-CM | POA: Diagnosis not present

## 2019-04-11 DIAGNOSIS — I10 Essential (primary) hypertension: Secondary | ICD-10-CM | POA: Diagnosis not present

## 2019-04-11 NOTE — Patient Instructions (Signed)
Medication Instructions:  Your Physician recommend you continue on your current medication as directed.    *If you need a refill on your cardiac medications before your next appointment, please call your pharmacy*  Lab Work: None  Testing/Procedures: None  Follow-Up: At [facility], you and your health needs are our priority.  As part of our continuing mission to provide you with exceptional heart care, we have created designated Provider Care Teams.  These Care Teams include your primary Cardiologist (physician) and Advanced Practice Providers (APPs -  Physician Assistants and Nurse Practitioners) who all work together to provide you with the care you need, when you need it.  Your next appointment:   12 months  The format for your next appointment:   In Person  Provider:   Bridgette Christopher, MD    

## 2019-04-11 NOTE — Progress Notes (Signed)
Cardiology Office Note:    Date:  04/11/2019   ID:  Hannah Stein, DOB Jun 23, 1928, MRN [id number]  PCP:  Hannah Pillar, MD  Cardiologist:  Hannah Dresser, MD  Referring MD: Hannah Pillar, MD   CC: follow up  History of Present Illness:    Hannah Stein is a 85 y.o. female with a hx of atrial fibrillation with RVR during hospitalization for URI 08/2018. She is seen today for follow up.  Today: Doing well overall. Staying safe, avoiding coronavirus. Has rare palpitations, brief, not bothersome to her.  BP up today as she felt stressed prior to arrival.  Reviewed recent labs with Dr. Laurann Stein. Wears compression stockings religiously. Tolerating rivaroxaban, bruises easily but no bleeding. Was on lower dose of rivaroxaban due to age and weight previously. Doesn't have home scale, but reports her weight at Dr. Delene Stein office was lower (130 lbs). Due to this, will keep lower dose of rivaroxaban. Reviewed medication list today.  Denies chest pain, shortness of breath at rest or with normal exertion.Sometimes feels short of breath if she lifts things over her head. No PND, orthopnea, LE edema or unexpected weight gain. No syncope or palpitations.  Past Medical History:  Diagnosis Date  . DVT, lower extremity [facility])     Past Surgical History:  Procedure Laterality Date  . THROAT SURGERY      Current Medications: Current Outpatient Medications on File Prior to Visit  Medication Sig  . albuterol (PROVENTIL HFA;VENTOLIN HFA) 108 (90 Base) MCG/ACT inhaler Inhale 2 puffs into the lungs every 6 (six) hours as needed for wheezing or shortness of breath.  . diltiazem (CARDIZEM CD) 120 MG 24 hr capsule Take 1 capsule (120 mg total) by mouth daily.  . Rivaroxaban (XARELTO) 15 MG TABS tablet Take 1 tablet (15 mg total) by mouth daily with supper.  . vitamin B-12 (CYANOCOBALAMIN) 500 MCG tablet Take 500 mcg by mouth daily.   No current facility-administered medications on file  prior to visit.      Allergies:   Patient has no known allergies.   Social History   Tobacco Use  . Smoking status: Former Smoker    Types: Cigarettes    Quit date: 07/22/1982    Years since quitting: 36.7  . Smokeless tobacco: Never Used  Substance Use Topics  . Alcohol use: No    Frequency: Never  . Drug use: No    Family History: family history includes CVA in her mother; Heart failure in her mother.  ROS:   Please see the history of present illness.  Additional pertinent ROS: Constitutional: Negative for chills, fever, night sweats, unintentional weight loss  HENT: Negative for ear pain and hearing loss.   Eyes: Negative for loss of vision and eye pain.  Respiratory: Negative for cough, sputum, wheezing.   Cardiovascular: See HPI. Gastrointestinal: Negative for abdominal pain, melena, and hematochezia.  Genitourinary: Negative for dysuria and hematuria.  Musculoskeletal: Negative for falls and myalgias.  Skin: Negative for itching and rash.  Neurological: Negative for focal weakness, focal sensory changes and loss of consciousness.  Endo/Heme/Allergies: Does not bruise/bleed easily.     EKGs/Labs/Other Studies Reviewed:    The following studies were reviewed today: Echo 08/09/18 personally reviewed  EKG:  EKG is personally reviewed.  The ekg ordered today demonstrates SR with PVC  Recent Labs: 08/07/2018: ALT 36 08/09/2018: BUN 38; Creatinine, Ser 1.08; Potassium 4.1; Sodium 140 08/10/2018: Hemoglobin 16.1; Platelets 231  Recent Lipid Panel No results found for: CHOL,  TRIG, HDL, CHOLHDL, VLDL, LDLCALC, LDLDIRECT  Physical Exam:    VS:  BP (!) 145/76   Pulse 66   Temp (!) 96.6 F (35.9 C)   Ht 5\' 3"  (1.6 m)   Wt 135 lb (61.2 kg)   SpO2 95%   BMI 23.91 kg/m     Wt Readings from Last 3 Encounters:  04/11/19 135 lb (61.2 kg)  10/13/18 130 lb (59 kg)  08/11/18 134 lb 11.2 oz (61.1 kg)    GEN: Well nourished, well developed in no acute distress HEENT: Normal,  moist mucous membranes NECK: No JVD CARDIAC: regular rhythm, normal S1 and S2, no rubs or gallops. No murmurs. VASCULAR: Radial and DP pulses 2+ bilaterally. No carotid bruits RESPIRATORY:  Clear to auscultation without rales, wheezing or rhonchi  ABDOMEN: Soft, non-tender, non-distended MUSCULOSKELETAL:  Ambulates independently SKIN: Warm and dry, no edema NEUROLOGIC:  Alert and oriented x 3. No focal neuro deficits noted. PSYCHIATRIC:  Normal affect    ASSESSMENT:    1. Paroxysmal atrial fibrillation (HCC)   2. Essential hypertension    PLAN:    Atrial fibrillation (presented with RVR in the hospital in the setting of URI 08/2018) -echo in hospital with normal EF -paroxsymal in the hospital, now in sinus rhythm on ECG -continue cardizem, rivaroxaban (reduced dose given age >55 and weight 60 kg). Doing well with these. -CHADSVASC=5, even if paroxysmal would continue rivaroxaban if tolerating  Hypertension: mildly elevated, but reports that she is anxious. Given age, prior history of good control, no change to meds.  Plan for follow up: 12 mos or sooner PRN  Medication Adjustments/Labs and Tests Ordered: Current medicines are reviewed at length with the patient today.  Concerns regarding medicines are outlined above.  Orders Placed This Encounter  Procedures  . EKG 12-Lead   No orders of the defined types were placed in this encounter.   Patient Instructions  Medication Instructions:  Your Physician recommend you continue on your current medication as directed.    *If you need a refill on your cardiac medications before your next appointment, please call your pharmacy*  Lab Work: None  Testing/Procedures: None  Follow-Up: At [facility], you and your health needs are our priority.  As part of our continuing mission to provide you with exceptional heart care, we have created designated Provider Care Teams.  These Care Teams include your primary Cardiologist  (physician) and Advanced Practice Providers (APPs -  Physician Assistants and Nurse Practitioners) who all work together to provide you with the care you need, when you need it.  Your next appointment:   12 months  The format for your next appointment:   In Person  Provider:   Buford Dresser, MD      Signed, Hannah Dresser, MD PhD 04/11/2019 5:50 PM    [place]

## 2019-05-10 ENCOUNTER — Encounter: Payer: Self-pay | Admitting: Cardiology

## 2019-08-10 DIAGNOSIS — D044 Carcinoma in situ of skin of scalp and neck: Secondary | ICD-10-CM | POA: Diagnosis not present

## 2019-08-10 DIAGNOSIS — Z85828 Personal history of other malignant neoplasm of skin: Secondary | ICD-10-CM | POA: Diagnosis not present

## 2019-08-10 DIAGNOSIS — L570 Actinic keratosis: Secondary | ICD-10-CM | POA: Diagnosis not present

## 2019-08-10 DIAGNOSIS — L853 Xerosis cutis: Secondary | ICD-10-CM | POA: Diagnosis not present

## 2019-08-10 DIAGNOSIS — D485 Neoplasm of uncertain behavior of skin: Secondary | ICD-10-CM | POA: Diagnosis not present

## 2019-08-24 DIAGNOSIS — D044 Carcinoma in situ of skin of scalp and neck: Secondary | ICD-10-CM | POA: Diagnosis not present

## 2019-08-28 DIAGNOSIS — J449 Chronic obstructive pulmonary disease, unspecified: Secondary | ICD-10-CM | POA: Diagnosis not present

## 2019-08-28 DIAGNOSIS — E559 Vitamin D deficiency, unspecified: Secondary | ICD-10-CM | POA: Diagnosis not present

## 2019-08-28 DIAGNOSIS — I4891 Unspecified atrial fibrillation: Secondary | ICD-10-CM | POA: Diagnosis not present

## 2019-08-28 DIAGNOSIS — M810 Age-related osteoporosis without current pathological fracture: Secondary | ICD-10-CM | POA: Diagnosis not present

## 2019-08-30 ENCOUNTER — Other Ambulatory Visit: Payer: Self-pay | Admitting: Cardiology

## 2019-09-03 ENCOUNTER — Other Ambulatory Visit: Payer: Self-pay | Admitting: Cardiology

## 2019-09-03 ENCOUNTER — Telehealth: Payer: Self-pay | Admitting: Cardiology

## 2019-09-03 NOTE — Telephone Encounter (Signed)
° °  Pt called to check meds.

## 2019-09-03 NOTE — Telephone Encounter (Signed)
*  STAT* If patient is at the pharmacy, call can be transferred to refill team.   1. Which medications need to be refilled? (please list name of each medication and dose if known)  diltiazem (CARDIZEM CD) 120 MG 24 hr capsule  2. Which pharmacy/location (including street and city if local pharmacy) is medication to be sent to?  [facility], [place]  3. Do they need a 30 day or 90 day supply? 71   Daughter of the patient says the pharmacy got the request for the xarelto but not for this medication

## 2019-09-04 ENCOUNTER — Telehealth: Payer: Self-pay | Admitting: Cardiology

## 2019-09-04 MED ORDER — DILTIAZEM HCL ER COATED BEADS 120 MG PO CP24: ORAL_CAPSULE | ORAL | 3 refills | Status: DC

## 2019-09-04 NOTE — Telephone Encounter (Signed)
Spoke with Jenny Reichmann at [facility]. Diltiazem did not go through as a refill request. Verbal order for refill given over the phone.

## 2019-09-04 NOTE — Telephone Encounter (Signed)
New Message  Pt c/o medication issue:  1. Name of Medication: diltiazem (CARDIZEM CD) 120 MG 24 hr capsule  2. How are you currently taking this medication (dosage and times per day)? As written  3. Are you having a reaction (difficulty breathing--STAT)? No  4. What is your medication issue? Patient is out of medication. Needs a new prescription ASAP

## 2019-09-04 NOTE — Telephone Encounter (Signed)
Informed pt that new Rx has been sent to [facility]. Pt verbalized understanding.

## 2020-02-07 DIAGNOSIS — H5213 Myopia, bilateral: Secondary | ICD-10-CM | POA: Diagnosis not present

## 2020-03-25 DIAGNOSIS — Z23 Encounter for immunization: Secondary | ICD-10-CM | POA: Diagnosis not present

## 2020-04-11 ENCOUNTER — Other Ambulatory Visit: Payer: Self-pay

## 2020-04-11 ENCOUNTER — Ambulatory Visit (INDEPENDENT_AMBULATORY_CARE_PROVIDER_SITE_OTHER): Payer: Medicare Other | Admitting: Cardiology

## 2020-04-11 ENCOUNTER — Encounter: Payer: Self-pay | Admitting: Cardiology

## 2020-04-11 VITALS — BP 130/62 | HR 63 | Ht 63.0 in | Wt 131.0 lb

## 2020-04-11 DIAGNOSIS — Z7901 Long term (current) use of anticoagulants: Secondary | ICD-10-CM

## 2020-04-11 DIAGNOSIS — I480 Paroxysmal atrial fibrillation: Secondary | ICD-10-CM | POA: Diagnosis not present

## 2020-04-11 DIAGNOSIS — I498 Other specified cardiac arrhythmias: Secondary | ICD-10-CM

## 2020-04-11 DIAGNOSIS — I10 Essential (primary) hypertension: Secondary | ICD-10-CM | POA: Diagnosis not present

## 2020-04-11 MED ORDER — RIVAROXABAN 15 MG PO TABS: 15.0000 mg | ORAL_TABLET | Freq: Every day | ORAL | 3 refills | Status: AC

## 2020-04-11 NOTE — Progress Notes (Signed)
Cardiology Office Note:    Date:  04/11/2020   ID:  Hannah Stein, DOB 02-Jan-1929, MRN [id number]  PCP:  Kelton Pillar, MD  Cardiologist:  Buford Dresser, MD  Referring MD: Kelton Pillar, MD   CC: follow up  History of Present Illness:    Hannah Stein is a 86 y.o. female with a hx of atrial fibrillation with RVR during hospitalization for URI 08/2018. She is seen today for follow up.  Today: Here with her daughter today. Has occasional shortness of breath, especially trying to move quickly. No palpitations, no fast/slow beats.   No issues with bleeding. Cr 1.21 per [facility], GFR based dose 15 mg daily, refilled rivaroxaban.  Denies chest pain, shortness of breath at rest. No PND, orthopnea, LE edema or unexpected weight gain. No syncope or palpitations.  Past Medical History:  Diagnosis Date  . DVT, lower extremity [facility])     Past Surgical History:  Procedure Laterality Date  . THROAT SURGERY      Current Medications: Current Outpatient Medications on File Prior to Visit  Medication Sig  . albuterol (PROVENTIL HFA;VENTOLIN HFA) 108 (90 Base) MCG/ACT inhaler Inhale 2 puffs into the lungs every 6 (six) hours as needed for wheezing or shortness of breath.  . diltiazem (CARDIZEM CD) 120 MG 24 hr capsule TAKE 1 CAPSULE(120 MG) BY MOUTH DAILY  . vitamin B-12 (CYANOCOBALAMIN) 500 MCG tablet Take 500 mcg by mouth daily.  Alveda Reasons 15 MG TABS tablet TAKE 1 TABLET(15 MG) BY MOUTH DAILY WITH SUPPER   No current facility-administered medications on file prior to visit.     Allergies:   Patient has no known allergies.   Social History   Tobacco Use  . Smoking status: Former Smoker    Types: Cigarettes    Quit date: 07/22/1982    Years since quitting: 37.7  . Smokeless tobacco: Never Used  Substance Use Topics  . Alcohol use: No  . Drug use: No    Family History: family history includes CVA in her mother; Heart failure in her mother.  ROS:   Please see the  history of present illness.  Additional pertinent ROS otherwise unremarkable.   EKGs/Labs/Other Studies Reviewed:    The following studies were reviewed today: Echo 08/09/18 personally reviewed  EKG:  EKG is personally reviewed.  The ekg ordered today demonstrates junctional rhythm at 63 bpm  Recent Labs: No results found for requested labs within last 8760 hours.  Recent Lipid Panel No results found for: CHOL, TRIG, HDL, CHOLHDL, VLDL, LDLCALC, LDLDIRECT  Physical Exam:    VS:  BP 130/62   Pulse 63   Ht 5\' 3"  (1.6 m)   Wt 131 lb (59.4 kg)   SpO2 91%   BMI 23.21 kg/m     Wt Readings from Last 3 Encounters:  04/11/20 131 lb (59.4 kg)  04/11/19 135 lb (61.2 kg)  10/13/18 130 lb (59 kg)    GEN: Well nourished, well developed in no acute distress HEENT: Normal, moist mucous membranes NECK: No JVD CARDIAC: regular rhythm, normal S1 and S2, no rubs or gallops. No murmur. VASCULAR: Radial and DP pulses 2+ bilaterally. No carotid bruits RESPIRATORY:  Clear to auscultation without rales, wheezing or rhonchi  ABDOMEN: Soft, non-tender, non-distended MUSCULOSKELETAL:  Ambulates independently SKIN: Warm and dry, no edema NEUROLOGIC:  Alert and oriented x 3. No focal neuro deficits noted. PSYCHIATRIC:  Normal affect   ASSESSMENT:    1. Paroxysmal atrial fibrillation (HCC)   2.  Junctional rhythm   3. Essential hypertension   4. Long term current use of anticoagulant    PLAN:    Atrial fibrillation, paroxysmal (presented with RVR in the hospital in the setting of URI 08/2018) -echo in hospital with normal EF -asymptomatic in junctional rhythm today. Will stop diltiazem -continue rivaroxaban, reduced dose given GFR -CHADSVASC=5, even if paroxysmal would continue rivaroxaban if tolerating  Hypertension: at goal today -with stopping diltiazem, monitor for rising blood pressure  Plan for follow up: 1 mos or sooner as needed  Medication Adjustments/Labs and Tests  Ordered: Current medicines are reviewed at length with the patient today.  Concerns regarding medicines are outlined above.  No orders of the defined types were placed in this encounter.  Meds ordered this encounter  Medications  . Rivaroxaban (XARELTO) 15 MG TABS tablet    Sig: Take 1 tablet (15 mg total) by mouth daily with supper.    Dispense:  90 tablet    Refill:  3    Patient Instructions  Medication Instructions:  Stop diltiazem 120 mg daily  *If you need a refill on your cardiac medications before your next appointment, please call your pharmacy*   Lab Work: None  Testing/Procedures: None   Follow-Up: At [facility], you and your health needs are our priority.  As part of our continuing mission to provide you with exceptional heart care, we have created designated Provider Care Teams.  These Care Teams include your primary Cardiologist (physician) and Advanced Practice Providers (APPs -  Physician Assistants and Nurse Practitioners) who all work together to provide you with the care you need, when you need it.  We recommend signing up for the patient portal called "MyChart".  Sign up information is provided on this After Visit Summary.  MyChart is used to connect with patients for Virtual Visits (Telemedicine).  Patients are able to view lab/test results, encounter notes, upcoming appointments, etc.  Non-urgent messages can be sent to your provider as well.   To learn more about what you can do with MyChart, go to [email or web address].    Your next appointment:   1 month(s)  The format for your next appointment:   In Person  Provider:   Buford Dresser, MD       Signed, Buford Dresser, MD PhD 04/11/2020 10:47 AM    [place]

## 2020-04-11 NOTE — Patient Instructions (Signed)
Medication Instructions:  Stop diltiazem 120 mg daily  *If you need a refill on your cardiac medications before your next appointment, please call your pharmacy*   Lab Work: None  Testing/Procedures: None   Follow-Up: At [facility], you and your health needs are our priority.  As part of our continuing mission to provide you with exceptional heart care, we have created designated Provider Care Teams.  These Care Teams include your primary Cardiologist (physician) and Advanced Practice Providers (APPs -  Physician Assistants and Nurse Practitioners) who all work together to provide you with the care you need, when you need it.  We recommend signing up for the patient portal called "MyChart".  Sign up information is provided on this After Visit Summary.  MyChart is used to connect with patients for Virtual Visits (Telemedicine).  Patients are able to view lab/test results, encounter notes, upcoming appointments, etc.  Non-urgent messages can be sent to your provider as well.   To learn more about what you can do with MyChart, go to [email or web address].    Your next appointment:   1 month(s)  The format for your next appointment:   In Person  Provider:   Buford Dresser, MD

## 2020-05-14 ENCOUNTER — Ambulatory Visit: Payer: Medicare Other | Admitting: Cardiology

## 2020-05-16 ENCOUNTER — Other Ambulatory Visit: Payer: Self-pay

## 2020-05-16 ENCOUNTER — Encounter: Payer: Self-pay | Admitting: Cardiology

## 2020-05-16 ENCOUNTER — Ambulatory Visit (INDEPENDENT_AMBULATORY_CARE_PROVIDER_SITE_OTHER): Payer: Medicare Other | Admitting: Cardiology

## 2020-05-16 VITALS — BP 154/74 | HR 69 | Ht 65.0 in | Wt 132.8 lb

## 2020-05-16 DIAGNOSIS — Z7901 Long term (current) use of anticoagulants: Secondary | ICD-10-CM

## 2020-05-16 DIAGNOSIS — I480 Paroxysmal atrial fibrillation: Secondary | ICD-10-CM | POA: Diagnosis not present

## 2020-05-16 DIAGNOSIS — R030 Elevated blood-pressure reading, without diagnosis of hypertension: Secondary | ICD-10-CM | POA: Diagnosis not present

## 2020-05-16 DIAGNOSIS — D6869 Other thrombophilia: Secondary | ICD-10-CM | POA: Diagnosis not present

## 2020-05-16 NOTE — Patient Instructions (Addendum)
Medication Instructions:  Your Physician recommend you continue on your current medication as directed.    *If you need a refill on your cardiac medications before your next appointment, please call your pharmacy*   Lab Work: None   Testing/Procedures: None   Follow-Up: At [facility], you and your health needs are our priority.  As part of our continuing mission to provide you with exceptional heart care, we have created designated Provider Care Teams.  These Care Teams include your primary Cardiologist (physician) and Advanced Practice Providers (APPs -  Physician Assistants and Nurse Practitioners) who all work together to provide you with the care you need, when you need it.  We recommend signing up for the patient portal called "MyChart".  Sign up information is provided on this After Visit Summary.  MyChart is used to connect with patients for Virtual Visits (Telemedicine).  Patients are able to view lab/test results, encounter notes, upcoming appointments, etc.  Non-urgent messages can be sent to your provider as well.   To learn more about what you can do with MyChart, go to [email or web address].    Your next appointment:   6 month(s)  The format for your next appointment:   In Person  Provider:   Buford Dresser, MD   Other Instructions For preventing nosebleed, try using saline nasal spray before bed. If the nosebleed comes back, pack the nose for pressure and pinch. If it doesn't let up, you can use Afrin (oxymetazoline) spray on the affected side. Don't use for more than 3 days. You can also spray a clean tampon with Afrin and place that in the affected nose as well.

## 2020-05-16 NOTE — Progress Notes (Signed)
Cardiology Office Note:    Date:  05/16/2020   ID:  Hannah Stein, DOB 12/02/1928, MRN [id number]  PCP:  Kelton Pillar, MD  Cardiologist:  Buford Dresser, MD  Referring MD: Kelton Pillar, MD   CC: follow up  History of Present Illness:    Hannah Stein is a 86 y.o. female with a hx of atrial fibrillation with RVR during hospitalization for URI 08/2018. She is seen today for follow up.  Today: Had nosebleed last weekend.  Doing well off the diltiazem. No issues. Doesn't check BP at home. Checks HR and O2 at home with pulse ox. Tends to be high during office visit.   Denies chest pain, shortness of breath at rest or with normal exertion. No PND, orthopnea, LE edema or unexpected weight gain. No syncope.  Past Medical History:  Diagnosis Date   DVT, lower extremity ([place])     Past Surgical History:  Procedure Laterality Date   THROAT SURGERY      Current Medications: Current Outpatient Medications on File Prior to Visit  Medication Sig   Rivaroxaban (XARELTO) 15 MG TABS tablet Take 1 tablet (15 mg total) by mouth daily with supper.   vitamin B-12 (CYANOCOBALAMIN) 500 MCG tablet Take 500 mcg by mouth daily.   No current facility-administered medications on file prior to visit.     Allergies:   Patient has no known allergies.   Social History   Tobacco Use   Smoking status: Former Smoker    Types: Cigarettes    Quit date: 07/22/1982    Years since quitting: 37.8   Smokeless tobacco: Never Used  Substance Use Topics   Alcohol use: No   Drug use: No    Family History: family history includes CVA in her mother; Heart failure in her mother.  ROS:   Please see the history of present illness.  Additional pertinent ROS otherwise unremarkable.  EKGs/Labs/Other Studies Reviewed:    The following studies were reviewed today: Echo 08/09/18 personally reviewed  EKG:  EKG is personally reviewed.  The ekg ordered today demonstrates SR with PVC at 69  bpm  Recent Labs: No results found for requested labs within last 8760 hours.  Recent Lipid Panel No results found for: CHOL, TRIG, HDL, CHOLHDL, VLDL, LDLCALC, LDLDIRECT  Physical Exam:    VS:  BP (!) 154/74   Pulse 69   Ht 5\' 5"  (1.651 m)   Wt 132 lb 12.8 oz (60.2 kg)   SpO2 97%   BMI 22.10 kg/m     Wt Readings from Last 3 Encounters:  05/16/20 132 lb 12.8 oz (60.2 kg)  04/11/20 131 lb (59.4 kg)  04/11/19 135 lb (61.2 kg)    GEN: Well nourished, well developed in no acute distress HEENT: Normal, moist mucous membranes NECK: No JVD CARDIAC: regular rhythm, normal S1 and S2, no rubs or gallops. No murmurs. VASCULAR: Radial and DP pulses 2+ bilaterally. No carotid bruits RESPIRATORY:  Clear to auscultation without rales, wheezing or rhonchi  ABDOMEN: Soft, non-tender, non-distended MUSCULOSKELETAL:  Ambulates independently SKIN: Warm and dry, no edema NEUROLOGIC:  Alert and oriented x 3. No focal neuro deficits noted. PSYCHIATRIC:  Normal affect    ASSESSMENT:    1. Paroxysmal atrial fibrillation (HCC)   2. Secondary hypercoagulable state ([place])   3. Long term current use of anticoagulant   4. Elevated blood pressure reading    PLAN:    Atrial fibrillation (presented with RVR in the hospital in the setting of  URI 08/2018) -echo in hospital with normal EF -paroxsymal in the hospital, now in sinus rhythm on ECG -continue cardizem, rivaroxaban (reduced dose given age >71 and weight 60 kg). Doing well with these. -CHADSVASC=5, even if paroxysmal would continue rivaroxaban if tolerating  Hypertension: mildly elevated, but reports that she is anxious. Given age, prior history of good control, no change to meds.  Plan for follow up: 6 mos or sooner PRN  Medication Adjustments/Labs and Tests Ordered: Current medicines are reviewed at length with the patient today.  Concerns regarding medicines are outlined above.  Orders Placed This Encounter  Procedures   EKG 12-Lead    No orders of the defined types were placed in this encounter.   Patient Instructions  Medication Instructions:  Your Physician recommend you continue on your current medication as directed.    *If you need a refill on your cardiac medications before your next appointment, please call your pharmacy*   Lab Work: None   Testing/Procedures: None   Follow-Up: At [facility], you and your health needs are our priority.  As part of our continuing mission to provide you with exceptional heart care, we have created designated Provider Care Teams.  These Care Teams include your primary Cardiologist (physician) and Advanced Practice Providers (APPs -  Physician Assistants and Nurse Practitioners) who all work together to provide you with the care you need, when you need it.  We recommend signing up for the patient portal called "MyChart".  Sign up information is provided on this After Visit Summary.  MyChart is used to connect with patients for Virtual Visits (Telemedicine).  Patients are able to view lab/test results, encounter notes, upcoming appointments, etc.  Non-urgent messages can be sent to your provider as well.   To learn more about what you can do with MyChart, go to [email or web address].    Your next appointment:   6 month(s)  The format for your next appointment:   In Person  Provider:   Buford Dresser, MD   Other Instructions For preventing nosebleed, try using saline nasal spray before bed. If the nosebleed comes back, pack the nose for pressure and pinch. If it doesn't let up, you can use Afrin (oxymetazoline) spray on the affected side. Don't use for more than 3 days. You can also spray a clean tampon with Afrin and place that in the affected nose as well.  Signed, Buford Dresser, MD PhD 05/16/2020  [place]

## 2020-06-19 ENCOUNTER — Encounter: Payer: Self-pay | Admitting: Cardiology

## 2020-08-12 DIAGNOSIS — Z85828 Personal history of other malignant neoplasm of skin: Secondary | ICD-10-CM | POA: Diagnosis not present

## 2020-08-12 DIAGNOSIS — L570 Actinic keratosis: Secondary | ICD-10-CM | POA: Diagnosis not present

## 2020-08-12 DIAGNOSIS — D225 Melanocytic nevi of trunk: Secondary | ICD-10-CM | POA: Diagnosis not present

## 2020-08-12 DIAGNOSIS — L814 Other melanin hyperpigmentation: Secondary | ICD-10-CM | POA: Diagnosis not present

## 2020-09-09 DIAGNOSIS — Z0000 Encounter for general adult medical examination without abnormal findings: Secondary | ICD-10-CM | POA: Diagnosis not present

## 2020-09-09 DIAGNOSIS — R7303 Prediabetes: Secondary | ICD-10-CM | POA: Diagnosis not present

## 2020-09-09 DIAGNOSIS — E559 Vitamin D deficiency, unspecified: Secondary | ICD-10-CM | POA: Diagnosis not present

## 2020-09-09 DIAGNOSIS — M810 Age-related osteoporosis without current pathological fracture: Secondary | ICD-10-CM | POA: Diagnosis not present

## 2020-11-12 NOTE — Progress Notes (Signed)
Cardiology Office Note:    Date:  11/14/2020   ID:  Hannah Stein, DOB 02-Sep-1928, MRN [id number]  PCP:  Kelton Pillar, MD  Cardiologist:  Buford Dresser, MD  Referring MD: Kelton Pillar, MD   CC: follow up  History of Present Illness:    Hannah Stein is a 87 y.o. female with a hx of atrial fibrillation, paroxysmal, with RVR during hospitalization for URI 08/2018. She is seen today for follow up.  Today: She is accompanied by her son-in-law, who also provides some history. This past February 2022 she called EMS for an episode of epistaxis from her left nostril that lasted for 4+ hours. Since then, at one time she began to bleed again, but she used nasal spray that resolved the bleeding quickly. Occasionally she strains with bowel movements and has very minor bleeding. She denies any hematuria.  While eating, she notices worsening rhinorrhea.   She denies any chest pain, shortness of breath, palpitations, or exertional symptoms. No headaches, lightheadedness, or syncope to report. Also has no lower extremity edema, orthopnea or PND.   Past Medical History:  Diagnosis Date   DVT, lower extremity ([place])     Past Surgical History:  Procedure Laterality Date   THROAT SURGERY      Current Medications: Current Outpatient Medications on File Prior to Visit  Medication Sig   Cholecalciferol (VITAMIN D) 50 MCG (2000 UT) CAPS 1 tablet   Rivaroxaban (XARELTO) 15 MG TABS tablet Take 1 tablet (15 mg total) by mouth daily with supper.   vitamin B-12 (CYANOCOBALAMIN) 500 MCG tablet Take 500 mcg by mouth daily.   No current facility-administered medications on file prior to visit.     Allergies:   Patient has no known allergies.   Social History   Tobacco Use   Smoking status: Former    Pack years: 0.00    Types: Cigarettes    Quit date: 07/22/1982    Years since quitting: 38.3   Smokeless tobacco: Never  Substance Use Topics   Alcohol use: No   Drug use: No     Family History: family history includes CVA in her mother; Heart failure in her mother.  ROS:   Please see the history of present illness.   (+) Epistaxis (+) Blood in stool (+) Rhinorrhea Additional pertinent ROS otherwise unremarkable.   EKGs/Labs/Other Studies Reviewed:    The following studies were reviewed today:  Echo 08/09/18 IMPRESSIONS 1. The left ventricle has normal systolic function with an ejection  fraction of 60-65%. The cavity size was normal. There is mildly increased  left ventricular wall thickness. Left ventricular diastology could not be  evaluated secondary to atrial  fibrillation. No evidence of left ventricular regional wall motion  abnormalities.   2. The right ventricle has normal systolic function. The cavity was  mildly enlarged. There is no increase in right ventricular wall thickness.   3. The mitral valve is normal in structure.   4. The tricuspid valve is normal in structure.   5. The aortic valve is normal in structure.   6. The aortic root and ascending aorta are normal in size and structure.   7. No evidence of left ventricular regional wall motion abnormalities.   SUMMARY  LVEF 60-65%, mild LVH, normal wall motion, normal biatrial size,  mild TR, RVSP 44 mmHg (mild pulmonary hypertension), normal IVC   EKG:  EKG is personally reviewed.   11/14/2020: NSR, rate 64 bpm  04/11/2020: junctional rhythm at 63 bpm  Recent Labs: No results found for requested labs within last 8760 hours.  Recent Lipid Panel No results found for: CHOL, TRIG, HDL, CHOLHDL, VLDL, LDLCALC, LDLDIRECT  Physical Exam:    VS:  BP 138/76 (BP Location: Right Arm, Patient Position: Sitting)   Pulse 64   Ht 5' 3.5" (1.613 m)   Wt 129 lb (58.5 kg)   BMI 22.49 kg/m     Wt Readings from Last 3 Encounters:  11/14/20 129 lb (58.5 kg)  05/16/20 132 lb 12.8 oz (60.2 kg)  04/11/20 131 lb (59.4 kg)    GEN: Well nourished, well developed in no acute distress HEENT:  Normal, moist mucous membranes NECK: No JVD CARDIAC: regular rhythm, normal S1 and S2, no rubs or gallops. No murmur. VASCULAR: Radial and DP pulses 2+ bilaterally. No carotid bruits RESPIRATORY:  Clear to auscultation without rales, wheezing or rhonchi  ABDOMEN: Soft, non-tender, non-distended MUSCULOSKELETAL:  Ambulates independently SKIN: Warm and dry, no edema NEUROLOGIC:  Alert and oriented x 3. No focal neuro deficits noted. PSYCHIATRIC:  Normal affect   ASSESSMENT:    1. Paroxysmal atrial fibrillation (HCC)   2. Long term current use of anticoagulant   3. Secondary hypercoagulable state ([place])   4. Essential hypertension   5. History of epistaxis    PLAN:    Atrial fibrillation, paroxysmal (presented with RVR in the hospital in the setting of URI 08/2018) Secondary hypercoagulable state On long term anticoagulation -echo in hospital with normal EF -diltiazem stopped due to junctional rhythm, in NSR on no rate agents today -continue rivaroxaban, reduced dose given GFR -CHADSVASC=5, even if paroxysmal would continue rivaroxaban if tolerating  Epistaxis: discussed treatment, only one severe episode, monitor   Hypertension: reasonable control on no medications today  Plan for follow up: 6 mos or sooner as needed  Medication Adjustments/Labs and Tests Ordered: Current medicines are reviewed at length with the patient today.  Concerns regarding medicines are outlined above.  Orders Placed This Encounter  Procedures   EKG 12-Lead   No orders of the defined types were placed in this encounter.   Patient Instructions  Medication Instructions:  Your Physician recommend you continue on your current medication as directed.    *If you need a refill on your cardiac medications before your next appointment, please call your pharmacy*   Lab Work: None ordered today   Testing/Procedures: None ordered today   Follow-Up: At [facility], you and your health needs are  our priority.  As part of our continuing mission to provide you with exceptional heart care, we have created designated Provider Care Teams.  These Care Teams include your primary Cardiologist (physician) and Advanced Practice Providers (APPs -  Physician Assistants and Nurse Practitioners) who all work together to provide you with the care you need, when you need it.  We recommend signing up for the patient portal called "MyChart".  Sign up information is provided on this After Visit Summary.  MyChart is used to connect with patients for Virtual Visits (Telemedicine).  Patients are able to view lab/test results, encounter notes, upcoming appointments, etc.  Non-urgent messages can be sent to your provider as well.   To learn more about what you can do with MyChart, go to [email or web address].    Your next appointment:   6 month(s) @ [address] [place], [place] [address]   The format for your next appointment:   In Person  Provider:   Buford Dresser, MD    [facility] Stump[facility],acting  as a Education administrator for [employer], MD.,have documented all relevant documentation on the behalf of Buford Dresser, MD,as directed by  Buford Dresser, MD while in the presence of Buford Dresser, MD.  I, Buford Dresser, MD, have reviewed all documentation for this visit. The documentation on 11/14/20 for the exam, diagnosis, procedures, and orders are all accurate and complete.   Signed, Buford Dresser, MD PhD 11/14/2020 11:31 AM    [place]

## 2020-11-14 ENCOUNTER — Other Ambulatory Visit: Payer: Self-pay

## 2020-11-14 ENCOUNTER — Ambulatory Visit (INDEPENDENT_AMBULATORY_CARE_PROVIDER_SITE_OTHER): Payer: Medicare Other | Admitting: Cardiology

## 2020-11-14 ENCOUNTER — Encounter: Payer: Self-pay | Admitting: Cardiology

## 2020-11-14 VITALS — BP 138/76 | HR 64 | Ht 63.5 in | Wt 129.0 lb

## 2020-11-14 DIAGNOSIS — Z7901 Long term (current) use of anticoagulants: Secondary | ICD-10-CM

## 2020-11-14 DIAGNOSIS — I480 Paroxysmal atrial fibrillation: Secondary | ICD-10-CM

## 2020-11-14 DIAGNOSIS — D6869 Other thrombophilia: Secondary | ICD-10-CM

## 2020-11-14 DIAGNOSIS — I10 Essential (primary) hypertension: Secondary | ICD-10-CM

## 2020-11-14 DIAGNOSIS — Z87898 Personal history of other specified conditions: Secondary | ICD-10-CM

## 2020-11-14 NOTE — Patient Instructions (Signed)
Medication Instructions:  Your Physician recommend you continue on your current medication as directed.    *If you need a refill on your cardiac medications before your next appointment, please call your pharmacy*   Lab Work: None ordered today   Testing/Procedures: None ordered today   Follow-Up: At [facility], you and your health needs are our priority.  As part of our continuing mission to provide you with exceptional heart care, we have created designated Provider Care Teams.  These Care Teams include your primary Cardiologist (physician) and Advanced Practice Providers (APPs -  Physician Assistants and Nurse Practitioners) who all work together to provide you with the care you need, when you need it.  We recommend signing up for the patient portal called "MyChart".  Sign up information is provided on this After Visit Summary.  MyChart is used to connect with patients for Virtual Visits (Telemedicine).  Patients are able to view lab/test results, encounter notes, upcoming appointments, etc.  Non-urgent messages can be sent to your provider as well.   To learn more about what you can do with MyChart, go to [email or web address].    Your next appointment:   6 month(s) @ [address] [place], [place] [address]   The format for your next appointment:   In Person  Provider:   Bridgette Christopher, MD     

## 2021-02-10 DIAGNOSIS — Z961 Presence of intraocular lens: Secondary | ICD-10-CM | POA: Diagnosis not present

## 2021-02-10 DIAGNOSIS — H5213 Myopia, bilateral: Secondary | ICD-10-CM | POA: Diagnosis not present

## 2021-03-21 DIAGNOSIS — Z23 Encounter for immunization: Secondary | ICD-10-CM | POA: Diagnosis not present

## 2021-04-29 ENCOUNTER — Inpatient Hospital Stay (HOSPITAL_COMMUNITY)
Admission: EM | Admit: 2021-04-29 | Discharge: 2021-06-07 | DRG: 393 | Disposition: E | Payer: Medicare Other | Attending: Internal Medicine | Admitting: Internal Medicine

## 2021-04-29 ENCOUNTER — Emergency Department (HOSPITAL_COMMUNITY): Payer: Medicare Other

## 2021-04-29 ENCOUNTER — Observation Stay (HOSPITAL_COMMUNITY): Payer: Medicare Other

## 2021-04-29 ENCOUNTER — Encounter (HOSPITAL_COMMUNITY): Payer: Self-pay

## 2021-04-29 DIAGNOSIS — R188 Other ascites: Secondary | ICD-10-CM | POA: Diagnosis present

## 2021-04-29 DIAGNOSIS — R64 Cachexia: Secondary | ICD-10-CM | POA: Diagnosis present

## 2021-04-29 DIAGNOSIS — D72829 Elevated white blood cell count, unspecified: Secondary | ICD-10-CM | POA: Diagnosis present

## 2021-04-29 DIAGNOSIS — I248 Other forms of acute ischemic heart disease: Secondary | ICD-10-CM | POA: Diagnosis present

## 2021-04-29 DIAGNOSIS — Z515 Encounter for palliative care: Secondary | ICD-10-CM

## 2021-04-29 DIAGNOSIS — R630 Anorexia: Secondary | ICD-10-CM | POA: Diagnosis not present

## 2021-04-29 DIAGNOSIS — R110 Nausea: Secondary | ICD-10-CM | POA: Diagnosis not present

## 2021-04-29 DIAGNOSIS — K6389 Other specified diseases of intestine: Secondary | ICD-10-CM | POA: Diagnosis present

## 2021-04-29 DIAGNOSIS — N179 Acute kidney failure, unspecified: Secondary | ICD-10-CM | POA: Diagnosis present

## 2021-04-29 DIAGNOSIS — Z79899 Other long term (current) drug therapy: Secondary | ICD-10-CM

## 2021-04-29 DIAGNOSIS — E875 Hyperkalemia: Secondary | ICD-10-CM | POA: Diagnosis present

## 2021-04-29 DIAGNOSIS — Z6821 Body mass index (BMI) 21.0-21.9, adult: Secondary | ICD-10-CM

## 2021-04-29 DIAGNOSIS — K766 Portal hypertension: Secondary | ICD-10-CM | POA: Diagnosis present

## 2021-04-29 DIAGNOSIS — K7460 Unspecified cirrhosis of liver: Secondary | ICD-10-CM | POA: Diagnosis present

## 2021-04-29 DIAGNOSIS — Z87891 Personal history of nicotine dependence: Secondary | ICD-10-CM

## 2021-04-29 DIAGNOSIS — E870 Hyperosmolality and hypernatremia: Secondary | ICD-10-CM | POA: Diagnosis present

## 2021-04-29 DIAGNOSIS — R627 Adult failure to thrive: Secondary | ICD-10-CM

## 2021-04-29 DIAGNOSIS — K5730 Diverticulosis of large intestine without perforation or abscess without bleeding: Secondary | ICD-10-CM | POA: Diagnosis present

## 2021-04-29 DIAGNOSIS — C187 Malignant neoplasm of sigmoid colon: Principal | ICD-10-CM | POA: Diagnosis present

## 2021-04-29 DIAGNOSIS — Z823 Family history of stroke: Secondary | ICD-10-CM

## 2021-04-29 DIAGNOSIS — R197 Diarrhea, unspecified: Secondary | ICD-10-CM | POA: Diagnosis not present

## 2021-04-29 DIAGNOSIS — R112 Nausea with vomiting, unspecified: Secondary | ICD-10-CM | POA: Diagnosis not present

## 2021-04-29 DIAGNOSIS — R1084 Generalized abdominal pain: Secondary | ICD-10-CM | POA: Diagnosis not present

## 2021-04-29 DIAGNOSIS — I3139 Other pericardial effusion (noninflammatory): Secondary | ICD-10-CM | POA: Diagnosis present

## 2021-04-29 DIAGNOSIS — Z20822 Contact with and (suspected) exposure to covid-19: Secondary | ICD-10-CM | POA: Diagnosis present

## 2021-04-29 DIAGNOSIS — R109 Unspecified abdominal pain: Secondary | ICD-10-CM

## 2021-04-29 DIAGNOSIS — Z7901 Long term (current) use of anticoagulants: Secondary | ICD-10-CM

## 2021-04-29 DIAGNOSIS — J449 Chronic obstructive pulmonary disease, unspecified: Secondary | ICD-10-CM | POA: Diagnosis present

## 2021-04-29 DIAGNOSIS — J939 Pneumothorax, unspecified: Secondary | ICD-10-CM

## 2021-04-29 DIAGNOSIS — Z66 Do not resuscitate: Secondary | ICD-10-CM | POA: Diagnosis not present

## 2021-04-29 DIAGNOSIS — K648 Other hemorrhoids: Secondary | ICD-10-CM | POA: Diagnosis present

## 2021-04-29 DIAGNOSIS — K56609 Unspecified intestinal obstruction, unspecified as to partial versus complete obstruction: Secondary | ICD-10-CM

## 2021-04-29 DIAGNOSIS — N1831 Chronic kidney disease, stage 3a: Secondary | ICD-10-CM | POA: Diagnosis present

## 2021-04-29 DIAGNOSIS — R531 Weakness: Secondary | ICD-10-CM | POA: Diagnosis not present

## 2021-04-29 DIAGNOSIS — E43 Unspecified severe protein-calorie malnutrition: Secondary | ICD-10-CM | POA: Diagnosis present

## 2021-04-29 DIAGNOSIS — K644 Residual hemorrhoidal skin tags: Secondary | ICD-10-CM | POA: Diagnosis present

## 2021-04-29 DIAGNOSIS — L8990 Pressure ulcer of unspecified site, unspecified stage: Secondary | ICD-10-CM | POA: Insufficient documentation

## 2021-04-29 DIAGNOSIS — J9383 Other pneumothorax: Secondary | ICD-10-CM | POA: Diagnosis present

## 2021-04-29 DIAGNOSIS — Z86718 Personal history of other venous thrombosis and embolism: Secondary | ICD-10-CM

## 2021-04-29 DIAGNOSIS — R7989 Other specified abnormal findings of blood chemistry: Secondary | ICD-10-CM | POA: Diagnosis present

## 2021-04-29 DIAGNOSIS — K639 Disease of intestine, unspecified: Principal | ICD-10-CM | POA: Diagnosis present

## 2021-04-29 DIAGNOSIS — R54 Age-related physical debility: Secondary | ICD-10-CM | POA: Diagnosis present

## 2021-04-29 DIAGNOSIS — E860 Dehydration: Secondary | ICD-10-CM | POA: Diagnosis present

## 2021-04-29 DIAGNOSIS — Z800 Family history of malignant neoplasm of digestive organs: Secondary | ICD-10-CM

## 2021-04-29 DIAGNOSIS — R778 Other specified abnormalities of plasma proteins: Secondary | ICD-10-CM | POA: Diagnosis present

## 2021-04-29 DIAGNOSIS — Z8249 Family history of ischemic heart disease and other diseases of the circulatory system: Secondary | ICD-10-CM

## 2021-04-29 DIAGNOSIS — R140 Abdominal distension (gaseous): Secondary | ICD-10-CM | POA: Diagnosis not present

## 2021-04-29 DIAGNOSIS — R1111 Vomiting without nausea: Secondary | ICD-10-CM | POA: Diagnosis not present

## 2021-04-29 DIAGNOSIS — I480 Paroxysmal atrial fibrillation: Secondary | ICD-10-CM | POA: Diagnosis present

## 2021-04-29 DIAGNOSIS — E8721 Acute metabolic acidosis: Secondary | ICD-10-CM | POA: Diagnosis not present

## 2021-04-29 DIAGNOSIS — I7143 Infrarenal abdominal aortic aneurysm, without rupture: Secondary | ICD-10-CM | POA: Diagnosis present

## 2021-04-29 DIAGNOSIS — Z419 Encounter for procedure for purposes other than remedying health state, unspecified: Secondary | ICD-10-CM

## 2021-04-29 DIAGNOSIS — I129 Hypertensive chronic kidney disease with stage 1 through stage 4 chronic kidney disease, or unspecified chronic kidney disease: Secondary | ICD-10-CM | POA: Diagnosis present

## 2021-04-29 DIAGNOSIS — N189 Chronic kidney disease, unspecified: Secondary | ICD-10-CM | POA: Diagnosis present

## 2021-04-29 DIAGNOSIS — K56600 Partial intestinal obstruction, unspecified as to cause: Secondary | ICD-10-CM | POA: Diagnosis present

## 2021-04-29 DIAGNOSIS — L89022 Pressure ulcer of left elbow, stage 2: Secondary | ICD-10-CM | POA: Diagnosis present

## 2021-04-29 LAB — COMPREHENSIVE METABOLIC PANEL
ALT: 23 U/L (ref 0–44)
AST: 27 U/L (ref 15–41)
Albumin: 2.9 g/dL — ABNORMAL LOW (ref 3.5–5.0)
Alkaline Phosphatase: 51 U/L (ref 38–126)
Anion gap: 9 (ref 5–15)
BUN: 45 mg/dL — ABNORMAL HIGH (ref 8–23)
CO2: 26 mmol/L (ref 22–32)
Calcium: 9.9 mg/dL (ref 8.9–10.3)
Chloride: 108 mmol/L (ref 98–111)
Creatinine, Ser: 1.61 mg/dL — ABNORMAL HIGH (ref 0.44–1.00)
GFR, Estimated: 30 mL/min — ABNORMAL LOW (ref >60)
Glucose, Bld: 110 mg/dL — ABNORMAL HIGH (ref 70–99)
Potassium: 4 mmol/L (ref 3.5–5.1)
Sodium: 143 mmol/L (ref 135–145)
Total Bilirubin: 1 mg/dL (ref 0.3–1.2)
Total Protein: 5.2 g/dL — ABNORMAL LOW (ref 6.5–8.1)

## 2021-04-29 LAB — CBC WITH DIFFERENTIAL/PLATELET
Abs Immature Granulocytes: 0.07 10*3/uL (ref 0.00–0.07)
Basophils Absolute: 0 10*3/uL (ref 0.0–0.1)
Basophils Relative: 0 %
Eosinophils Absolute: 0 10*3/uL (ref 0.0–0.5)
Eosinophils Relative: 0 %
HCT: 47.8 % — ABNORMAL HIGH (ref 36.0–46.0)
Hemoglobin: 15 g/dL (ref 12.0–15.0)
Immature Granulocytes: 1 %
Lymphocytes Relative: 5 %
Lymphs Abs: 0.5 10*3/uL — ABNORMAL LOW (ref 0.7–4.0)
MCH: 27.7 pg (ref 26.0–34.0)
MCHC: 31.4 g/dL (ref 30.0–36.0)
MCV: 88.2 fL (ref 80.0–100.0)
Monocytes Absolute: 1.3 10*3/uL — ABNORMAL HIGH (ref 0.1–1.0)
Monocytes Relative: 12 %
Neutro Abs: 9.6 10*3/uL — ABNORMAL HIGH (ref 1.7–7.7)
Neutrophils Relative %: 82 %
Platelets: 342 10*3/uL (ref 150–400)
RBC: 5.42 MIL/uL — ABNORMAL HIGH (ref 3.87–5.11)
RDW: 14.2 % (ref 11.5–15.5)
WBC: 11.6 10*3/uL — ABNORMAL HIGH (ref 4.0–10.5)
nRBC: 0 % (ref 0.0–0.2)

## 2021-04-29 LAB — RESP PANEL BY RT-PCR (FLU A&B, COVID) ARPGX2
Influenza A by PCR: NEGATIVE
Influenza B by PCR: NEGATIVE
SARS Coronavirus 2 by RT PCR: NEGATIVE

## 2021-04-29 LAB — LIPASE, BLOOD: Lipase: 32 U/L (ref 11–51)

## 2021-04-29 LAB — TROPONIN I (HIGH SENSITIVITY)
Troponin I (High Sensitivity): 32 ng/L — ABNORMAL HIGH (ref <18)
Troponin I (High Sensitivity): 32 ng/L — ABNORMAL HIGH (ref <18)

## 2021-04-29 LAB — PROTIME-INR
INR: 1.2 (ref 0.8–1.2)
Prothrombin Time: 15.3 seconds — ABNORMAL HIGH (ref 11.4–15.2)

## 2021-04-29 MED ORDER — FENTANYL CITRATE PF 50 MCG/ML IJ SOSY
50.0000 ug | PREFILLED_SYRINGE | Freq: Once | INTRAMUSCULAR | Status: AC
  Administered 2021-04-29: 50 ug via INTRAVENOUS
  Filled 2021-04-29: qty 1

## 2021-04-29 MED ORDER — PANTOPRAZOLE SODIUM 40 MG IV SOLR
40.0000 mg | INTRAVENOUS | Status: DC
  Administered 2021-04-30: 40 mg via INTRAVENOUS
  Filled 2021-04-29: qty 40

## 2021-04-29 MED ORDER — ONDANSETRON HCL 4 MG/2ML IJ SOLN
4.0000 mg | Freq: Four times a day (QID) | INTRAMUSCULAR | Status: DC | PRN
  Administered 2021-05-01 – 2021-05-03 (×5): 4 mg via INTRAVENOUS
  Filled 2021-04-29 (×6): qty 2

## 2021-04-29 MED ORDER — OXYCODONE HCL 5 MG PO TABS: 5.0000 mg | ORAL_TABLET | Freq: Four times a day (QID) | ORAL | Status: DC | PRN

## 2021-04-29 MED ORDER — ONDANSETRON HCL 4 MG PO TABS: 4.0000 mg | ORAL_TABLET | Freq: Four times a day (QID) | ORAL | Status: DC | PRN

## 2021-04-29 MED ORDER — ALBUTEROL SULFATE (2.5 MG/3ML) 0.083% IN NEBU: 2.5000 mg | INHALATION_SOLUTION | Freq: Four times a day (QID) | RESPIRATORY_TRACT | Status: DC | PRN

## 2021-04-29 MED ORDER — VITAMIN B-12 1000 MCG PO TABS
500.0000 ug | ORAL_TABLET | Freq: Every day | ORAL | Status: DC
  Administered 2021-04-30 – 2021-05-04 (×4): 500 ug via ORAL
  Filled 2021-04-29 (×4): qty 1

## 2021-04-29 MED ORDER — LACTATED RINGERS IV SOLN: INTRAVENOUS | Status: AC

## 2021-04-29 MED ORDER — VITAMIN D 25 MCG (1000 UNIT) PO TABS
2000.0000 [IU] | ORAL_TABLET | Freq: Two times a day (BID) | ORAL | Status: DC
  Administered 2021-04-29 – 2021-05-04 (×9): 2000 [IU] via ORAL
  Filled 2021-04-29 (×9): qty 2

## 2021-04-29 MED ORDER — PANTOPRAZOLE SODIUM 40 MG IV SOLR
40.0000 mg | Freq: Once | INTRAVENOUS | Status: AC
  Administered 2021-04-29: 40 mg via INTRAVENOUS
  Filled 2021-04-29: qty 40

## 2021-04-29 MED ORDER — ONDANSETRON HCL 4 MG/2ML IJ SOLN
4.0000 mg | Freq: Once | INTRAMUSCULAR | Status: AC
  Administered 2021-04-29: 4 mg via INTRAVENOUS
  Filled 2021-04-29: qty 2

## 2021-04-29 MED ORDER — LACTATED RINGERS IV BOLUS
500.0000 mL | Freq: Once | INTRAVENOUS | Status: AC
  Administered 2021-04-29: 500 mL via INTRAVENOUS

## 2021-04-29 MED ORDER — HYDROMORPHONE HCL 1 MG/ML IJ SOLN
0.5000 mg | INTRAMUSCULAR | Status: DC | PRN
  Filled 2021-04-29: qty 1

## 2021-04-29 MED ORDER — ACETAMINOPHEN 650 MG RE SUPP: 650.0000 mg | Freq: Four times a day (QID) | RECTAL | Status: DC | PRN

## 2021-04-29 MED ORDER — ACETAMINOPHEN 325 MG PO TABS: 650.0000 mg | ORAL_TABLET | Freq: Four times a day (QID) | ORAL | Status: DC | PRN

## 2021-04-29 MED ORDER — PANTOPRAZOLE SODIUM 40 MG IV SOLR: 40.0000 mg | INTRAVENOUS | Status: DC

## 2021-04-29 MED ORDER — IOHEXOL 300 MG/ML  SOLN
70.0000 mL | Freq: Once | INTRAMUSCULAR | Status: AC | PRN
  Administered 2021-04-29: 70 mL via INTRAVENOUS

## 2021-04-29 NOTE — Plan of Care (Signed)

## 2021-04-29 NOTE — ED Notes (Signed)
Pt had episode of urinating on self - bed sheets changed, brief and chuck placed under patient, pt placed back on purewick

## 2021-04-29 NOTE — ED Provider Notes (Signed)
[facility] EMERGENCY DEPARTMENT Provider Note   CSN: [id number] Arrival date & time: 04/13/2021  1256     History Chief Complaint  Patient presents with   Abdominal Pain    Hannah Stein is a 87 y.o. female.  HPI     Has had some abdominal pain over the last month, really worsened over the last week and really severe pain When tries to lay back down feels severe nausea, burning Last night was hurting all night , laid on the couch Pain is low abdomen and radiating to the epigastrium, both pain and nausea Vomited twice Monday of this week, spitting up constantly, threw up last Wednesday after having pizza.  Pain doesn't worsen with eating.  Not eating, nothing tastes good For about a week Burping No chest pain but points to area of lower chest that hurts when she burps No shortness of breath, just feels nausea when lays down, but if walk around would feel dyspnea Diarrhea for the past week, sometimes 2-3 times a day.    Past Medical History:  Diagnosis Date   DVT, lower extremity [facility])     Patient Active Problem List   Diagnosis Date Noted   Failure to thrive in adult 04/28/2021   Acute kidney injury superimposed on chronic kidney disease ([place]) 04/23/2021   Ascites 04/22/2021   possible sigmoid colon mass 05/02/2021   Elevated troponin 04/17/2021   Paroxysmal atrial fibrillation ([place]) 09/05/2018   Leukocytosis     Past Surgical History:  Procedure Laterality Date   THROAT SURGERY       OB History   No obstetric history on file.     Family History  Problem Relation Age of Onset   Heart failure Mother    CVA Mother        "mini strokes"    Social History   Tobacco Use   Smoking status: Former    Types: Cigarettes    Quit date: 07/22/1982    Years since quitting: 38.8   Smokeless tobacco: Never  Substance Use Topics   Alcohol use: No   Drug use: No    Home Medications Prior to Admission medications   Medication Sig Start  Date End Date Taking? Authorizing Provider  Cholecalciferol (VITAMIN D) 50 MCG (2000 UT) CAPS Take 1 capsule by mouth 2 (two) times daily.   Yes [provider]  Rivaroxaban (XARELTO) 15 MG TABS tablet Take 1 tablet (15 mg total) by mouth daily with supper. 04/11/20  Yes Buford Dresser, MD  vitamin B-12 (CYANOCOBALAMIN) 500 MCG tablet Take 500 mcg by mouth daily.   Yes [provider]    Allergies    Patient has no known allergies.  Review of Systems   Review of Systems  Constitutional:  Negative for fever.  HENT:  Negative for sore throat.   Eyes:  Negative for visual disturbance.  Respiratory:  Negative for cough and shortness of breath.   Cardiovascular:  Negative for chest pain.  Gastrointestinal:  Positive for abdominal pain, diarrhea, nausea and vomiting.  Genitourinary:  Negative for difficulty urinating and dysuria.  Musculoskeletal:  Negative for back pain and neck pain.  Skin:  Negative for rash.  Neurological:  Negative for syncope and headaches.   Physical Exam Updated Vital Signs BP 127/60   Pulse 79   Temp (!) 97 F (36.1 C)   Resp (!) 25   Ht 5\' 3"  (1.6 m)   Wt 54.7 kg   SpO2 99%  BMI 21.36 kg/m   Physical Exam Vitals and nursing note reviewed.  Constitutional:      General: She is not in acute distress.    Appearance: She is well-developed. She is not diaphoretic.  HENT:     Head: Normocephalic and atraumatic.  Eyes:     Conjunctiva/sclera: Conjunctivae normal.  Cardiovascular:     Rate and Rhythm: Normal rate and regular rhythm.     Heart sounds: Normal heart sounds. No murmur heard.   No friction rub. No gallop.  Pulmonary:     Effort: Pulmonary effort is normal. No respiratory distress.     Breath sounds: Normal breath sounds. No wheezing or rales.  Abdominal:     General: There is distension.     Palpations: Abdomen is soft. There is mass (mass vs hepatic enlargement).     Tenderness: There is abdominal tenderness.  There is no guarding.  Musculoskeletal:        General: No tenderness.     Cervical back: Normal range of motion.  Skin:    General: Skin is warm and dry.     Findings: No erythema or rash.  Neurological:     Mental Status: She is alert and oriented to person, place, and time.    ED Results / Procedures / Treatments   Labs (all labs ordered are listed, but only abnormal results are displayed) Labs Reviewed  CBC WITH DIFFERENTIAL/PLATELET - Abnormal; Notable for the following components:      Result Value   WBC 11.6 (*)    RBC 5.42 (*)    HCT 47.8 (*)    Neutro Abs 9.6 (*)    Lymphs Abs 0.5 (*)    Monocytes Absolute 1.3 (*)    All other components within normal limits  COMPREHENSIVE METABOLIC PANEL - Abnormal; Notable for the following components:   Glucose, Bld 110 (*)    BUN 45 (*)    Creatinine, Ser 1.61 (*)    Total Protein 5.2 (*)    Albumin 2.9 (*)    GFR, Estimated 30 (*)    All other components within normal limits  PROTIME-INR - Abnormal; Notable for the following components:   Prothrombin Time 15.3 (*)    All other components within normal limits  TROPONIN I (HIGH SENSITIVITY) - Abnormal; Notable for the following components:   Troponin I (High Sensitivity) 32 (*)    All other components within normal limits  TROPONIN I (HIGH SENSITIVITY) - Abnormal; Notable for the following components:   Troponin I (High Sensitivity) 32 (*)    All other components within normal limits  RESP PANEL BY RT-PCR (FLU A&B, COVID) ARPGX2  LIPASE, BLOOD  URINALYSIS, ROUTINE W REFLEX MICROSCOPIC  COMPREHENSIVE METABOLIC PANEL  CBC  CEA  HEPATITIS PANEL, ACUTE    EKG EKG Interpretation  Date/Time:  Wednesday April 29 2021 14:01:56 EST Ventricular Rate:  78 PR Interval:  121 QRS Duration: 96 QT Interval:  361 QTC Calculation: 412 R Axis:   66 Text Interpretation: Sinus rhythm Since prior ECG, now in normal sinus rhythm Confirmed by Gareth Morgan [id number]) on 04/09/2021  2:36:09 PM  Radiology CT ABDOMEN PELVIS W CONTRAST  Result Date: 04/12/2021 CLINICAL DATA:  Abdominal pain EXAM: CT ABDOMEN AND PELVIS WITH CONTRAST TECHNIQUE: Multidetector CT imaging of the abdomen and pelvis was performed using the standard protocol following bolus administration of intravenous contrast. CONTRAST:  45mL OMNIPAQUE IOHEXOL 300 MG/ML  SOLN COMPARISON:  None. FINDINGS: Lower chest: Centrilobular and panlobular emphysema is  seen. There is small pericardial effusion. Small bilateral pleural effusions are seen, more so on the right side. There is fluid in the lumen of lower thoracic esophagus suggesting gastroesophageal reflux. Hepatobiliary: Liver measures 10.1 cm in length. There is nodularity in the liver surface suggesting cirrhosis. There is no dilation of bile ducts. There is mild wall thickening in gallbladder which may be due to chronic cholecystitis or related to ascites. Pancreas: No focal abnormality is seen Spleen: Spleen measures 10.2 cm in maximum diameter. No focal abnormality is seen. Adrenals/Urinary Tract: Adrenals are unremarkable. There is no hydronephrosis. There is contrast in the pelvocaliceal systems in the kidneys limiting evaluation for small nonobstructing stones. There is no hydronephrosis. There is 3.7 cm fluid density structure in the midportion of left kidney suggesting renal cyst. Stomach/Bowel: Small hiatal hernia is seen. Stomach is not distended. Small bowel loops are not dilated. Appendix is not distinctly seen. There is no focal pericecal inflammation. Scattered diverticula are seen in the colon without definite signs of focal diverticulitis. There is abnormal wall thickening in the sigmoid colon. There is possible circumferential mass in the sigmoid colon rmeasuring approximately 8.2 x 3.4 cm. Vascular/Lymphatic: Extensive atherosclerotic plaques and calcifications are seen in the abdominal aorta and its major branches. There is 2.9 cm infrarenal aortic  aneurysm. There is abnormal thickening of omentum. Reproductive: Unremarkable Other: Large ascites is present.  There is no pneumoperitoneum. Musculoskeletal: Degenerative changes are noted in the lumbar spine more so at L3-L4 and L4-L5 levels. IMPRESSION: There is no evidence of intestinal obstruction or pneumoperitoneum. There is no hydronephrosis. There is abnormal circumferential wall thickening in the sigmoid colon. Possibility of a malignant neoplastic process is not excluded. Endoscopy as clinically warranted should be considered. 2.9 cm infrarenal abdominal aortic aneurysm. Diverticulosis of colon without signs of focal acute diverticulitis. Cirrhosis of liver. Large ascites. There is abnormal thickening of omentum which may be due to chronic inflammation or neoplastic process. Small hiatal hernia. Possible gastroesophageal reflux. Small pericardial effusion. Small bilateral pleural effusions. COPD. Other findings as described in the body of the report. Electronically Signed   By: Elmer Picker M.D.   On: 05/03/2021 16:41   [place] ASCITES (ABDOMEN LIMITED)  Result Date: 04/10/2021 CLINICAL DATA:  Assess for ascites EXAM: LIMITED ABDOMEN ULTRASOUND FOR ASCITES TECHNIQUE: Limited ultrasound survey for ascites was performed in all four abdominal quadrants. COMPARISON:  CT 04/16/2021 FINDINGS: Ultrasound evaluation of the 4 quadrants is performed. Large volume of ascites within the abdomen, greatest in the upper quadrants. Incidental note made of a left renal cyst. IMPRESSION: Large volume of ascites. Electronically Signed   By: Donavan Foil M.D.   On: 04/13/2021 19:47    Procedures Procedures   Medications Ordered in ED Medications  lactated ringers infusion ( Intravenous New Bag/Given 04/20/2021 2117)  acetaminophen (TYLENOL) tablet 650 mg (has no administration in time range)    Or  acetaminophen (TYLENOL) suppository 650 mg (has no administration in time range)  oxyCODONE (Oxy IR/ROXICODONE)  immediate release tablet 5 mg (has no administration in time range)  HYDROmorphone (DILAUDID) injection 0.5 mg (has no administration in time range)  ondansetron (ZOFRAN) tablet 4 mg (has no administration in time range)    Or  ondansetron (ZOFRAN) injection 4 mg (has no administration in time range)  albuterol (PROVENTIL) (2.5 MG/3ML) 0.083% nebulizer solution 2.5 mg (has no administration in time range)  vitamin B-12 (CYANOCOBALAMIN) tablet 500 mcg (has no administration in time range)  cholecalciferol (VITAMIN D3) tablet 2,000  Units (2,000 Units Oral Given 04/08/2021 2235)  pantoprazole (PROTONIX) injection 40 mg (has no administration in time range)  ondansetron (ZOFRAN) injection 4 mg (4 mg Intravenous Given 05/05/2021 1520)  pantoprazole (PROTONIX) injection 40 mg (40 mg Intravenous Given 04/28/2021 1521)  fentaNYL (SUBLIMAZE) injection 50 mcg (50 mcg Intravenous Given 04/08/2021 1521)  lactated ringers bolus 500 mL (0 mLs Intravenous Stopped 04/30/2021 1700)  iohexol (OMNIPAQUE) 300 MG/ML solution 70 mL (70 mLs Intravenous Contrast Given 04/12/2021 1551)    ED Course  I have reviewed the triage vital signs and the nursing notes.  Pertinent labs & imaging results that were available during my care of the patient were reviewed by me and considered in my medical decision making (see chart for details).    MDM Rules/Calculators/A&P                           87yo female with history of DVT, paroxysmal atrial fibrillation sho presents with concern for nausea, abdominal pain, diarrhea. DDx includes appendicitis, pancreatitis, cholecystitis, pyelonephritis, nephrolithiasis, diverticulitis, gastritis, GERD, ACS.  Signed out to Dr. Ronnald Nian with troponin, CT,UA pending.    Final Clinical Impression(s) / ED Diagnoses Final diagnoses:  Nausea  Nausea and vomiting, unspecified vomiting type  Abdominal pain, unspecified abdominal location  Cirrhosis of liver with ascites, unspecified hepatic cirrhosis  type [facility])  Colonic mass    Rx / DC Orders ED Discharge Orders     None        Gareth Morgan, MD 04/30/21 0028

## 2021-04-29 NOTE — H&P (Signed)
History and Physical    Hannah Stein JXB:[id number] DOB: Sep 15, 1928 DOA: 04/10/2021  PCP: Kelton Pillar, MD Consultants:  cardiology: Dr. Harrell Gave endo: dr. Chalmers Cater  Patient coming from:  Home - lives alone. Daughter checks on her daily.   Chief Complaint: abdominal pain, poor PO intake, weight loss.    HPI: Hannah Stein is a 87 y.o. female with medical history significant of PAF on xarelto, COPD,  who presented with abdominal pain, N/V and poor PO intake with weight loss. Her daughter is here and tells most of history. She tells me when she called her last night her mom was out of character and a little short with her. When they checked on her today they had concerns. She was really weak and could hardly talk. They also found out she has had abdominal pain since middle of October. Pain is epigastric and rates 10/10. Pain starts in bottom and radiates up to her epigastric area. Pain is constant. Pain described as aching/stabbing. she has also asked for tums. They also found out she has not eaten well since mid October "because it doesn't taste good." Her daughter states she hasn't eaten much of anything. They also feel like she has lost a lot of weight, but unsure of how much. (>10 pounds). She has also had nausea and vomiting since last week. She has had about one episode of vomiting, but has had numerous episodes of burping and spitting up.   Colonoscopy around 70-61 years of age. Report normal except for hemorrhoids. [facility]. History of colon cancer in her father. She reports having blood in her stool "once in awhile." She has noticed over the past few weeks her stool is "runny." No solid stool.   She denies any fever/chills, chest pain/palpitations, she has chronic shortness of breath at her baseline, no cough, no dysuria. +leg swelling.     ED Course: vitals: Afebrile, blood pressure 130/60, heart rate 80, respiratory rate 24, oxygen 97% on room air. Pertinent labs: WBC 11.6  (12.0-13.2), BUN 45, creatinine 1.61, troponin 32 CT abdomen pelvis: Abnormal circumferential wall thickening in the sigmoid colon possible mass measuring 8.2 x 3.4 cm.  Possibility of malignant neoplastic process not excluded.  2.9 cm infrarenal abdominal aortic aneurysm.  Cirrhosis of liver with large ascites.  Also abnormal thickening of the omentum.  Mild pericardial effusion, small bilateral pleural effusions, COPD. IN ED patient given 500 mL bolus, Zofran, Protonix and fentanyl.  TRH was asked to admit.   Review of Systems: As per HPI; otherwise review of systems reviewed and negative.   Ambulatory Status:  Ambulates without assistance   Past Medical History:  Diagnosis Date   DVT, lower extremity ([place])     Past Surgical History:  Procedure Laterality Date   THROAT SURGERY      Social History   Socioeconomic History   Marital status: Divorced    Spouse name: Not on file   Number of children: Not on file   Years of education: Not on file   Highest education level: Not on file  Occupational History   Not on file  Tobacco Use   Smoking status: Former    Types: Cigarettes    Quit date: 07/22/1982    Years since quitting: 38.7   Smokeless tobacco: Never  Substance and Sexual Activity   Alcohol use: No   Drug use: No   Sexual activity: Not on file  Other Topics Concern   Not on file  Social History Narrative  Not on file   Social Determinants of Health   Financial Resource Strain: Not on file  Food Insecurity: Not on file  Transportation Needs: Not on file  Physical Activity: Not on file  Stress: Not on file  Social Connections: Not on file  Intimate Partner Violence: Not on file    No Known Allergies  Family History  Problem Relation Age of Onset   Heart failure Mother    CVA Mother        "mini strokes"    Prior to Admission medications   Medication Sig Start Date End Date Taking? Authorizing Provider  Cholecalciferol (VITAMIN D) 50 MCG (2000 UT)  CAPS Take 1 capsule by mouth 2 (two) times daily.   Yes [provider]  Rivaroxaban (XARELTO) 15 MG TABS tablet Take 1 tablet (15 mg total) by mouth daily with supper. 04/11/20  Yes Buford Dresser, MD  vitamin B-12 (CYANOCOBALAMIN) 500 MCG tablet Take 500 mcg by mouth daily.   Yes [provider]    Physical Exam: Vitals:   04/19/2021 1400 04/12/2021 1430 04/27/2021 1500 05/02/2021 1515  BP: 121/72 (!) 143/66 (!) 121/59 121/60  Pulse: 77 77 78 73  Resp: (!) 22 (!) 21 18 16   Temp:      TempSrc:      SpO2: 95% 96% 98% 96%  Weight:      Height:         General:  Appears calm and comfortable and is in NAD. Cachetic.  Eyes:  PERRL, EOMI, normal lids, iris ENT:  grossly normal hearing, lips & tongue, mmm; poor dentition with poor oral hygiene.  Neck:  no LAD, masses or thyromegaly, mobile cyst on thyroid; no carotid bruits Cardiovascular:  RRR, no m/r/g. 2+ above the ankle edema bilaterally  Respiratory:   CTA bilaterally with no wheezes/rales/rhonchi.  Normal respiratory effort. Abdomen:  soft, distended abdomen. +hepatomegaly. TTP epigastric area. No rebound or guarding.  Back:   normal alignment, no CVAT Skin:  no rash or induration seen on limited exam Musculoskeletal:  grossly normal tone BUE/BLE, good ROM, no bony abnormality. Globally weak  Lower extremity:   Limited foot exam with no ulcerations.  2+ distal pulses. Psychiatric:  grossly normal mood and affect, speech fluent and appropriate, AOx3 Neurologic:  CN 2-12 grossly intact, moves all extremities in coordinated fashion, sensation intact    Radiological Exams on Admission: Independently reviewed - see discussion in A/P where applicable  CT ABDOMEN PELVIS W CONTRAST  Result Date: 04/21/2021 CLINICAL DATA:  Abdominal pain EXAM: CT ABDOMEN AND PELVIS WITH CONTRAST TECHNIQUE: Multidetector CT imaging of the abdomen and pelvis was performed using the standard protocol following bolus administration of  intravenous contrast. CONTRAST:  75mL OMNIPAQUE IOHEXOL 300 MG/ML  SOLN COMPARISON:  None. FINDINGS: Lower chest: Centrilobular and panlobular emphysema is seen. There is small pericardial effusion. Small bilateral pleural effusions are seen, more so on the right side. There is fluid in the lumen of lower thoracic esophagus suggesting gastroesophageal reflux. Hepatobiliary: Liver measures 10.1 cm in length. There is nodularity in the liver surface suggesting cirrhosis. There is no dilation of bile ducts. There is mild wall thickening in gallbladder which may be due to chronic cholecystitis or related to ascites. Pancreas: No focal abnormality is seen Spleen: Spleen measures 10.2 cm in maximum diameter. No focal abnormality is seen. Adrenals/Urinary Tract: Adrenals are unremarkable. There is no hydronephrosis. There is contrast in the pelvocaliceal systems in the kidneys limiting evaluation for small nonobstructing stones. There  is no hydronephrosis. There is 3.7 cm fluid density structure in the midportion of left kidney suggesting renal cyst. Stomach/Bowel: Small hiatal hernia is seen. Stomach is not distended. Small bowel loops are not dilated. Appendix is not distinctly seen. There is no focal pericecal inflammation. Scattered diverticula are seen in the colon without definite signs of focal diverticulitis. There is abnormal wall thickening in the sigmoid colon. There is possible circumferential mass in the sigmoid colon rmeasuring approximately 8.2 x 3.4 cm. Vascular/Lymphatic: Extensive atherosclerotic plaques and calcifications are seen in the abdominal aorta and its major branches. There is 2.9 cm infrarenal aortic aneurysm. There is abnormal thickening of omentum. Reproductive: Unremarkable Other: Large ascites is present.  There is no pneumoperitoneum. Musculoskeletal: Degenerative changes are noted in the lumbar spine more so at L3-L4 and L4-L5 levels. IMPRESSION: There is no evidence of intestinal  obstruction or pneumoperitoneum. There is no hydronephrosis. There is abnormal circumferential wall thickening in the sigmoid colon. Possibility of a malignant neoplastic process is not excluded. Endoscopy as clinically warranted should be considered. 2.9 cm infrarenal abdominal aortic aneurysm. Diverticulosis of colon without signs of focal acute diverticulitis. Cirrhosis of liver. Large ascites. There is abnormal thickening of omentum which may be due to chronic inflammation or neoplastic process. Small hiatal hernia. Possible gastroesophageal reflux. Small pericardial effusion. Small bilateral pleural effusions. COPD. Other findings as described in the body of the report. Electronically Signed   By: Elmer Picker M.D.   On: 04/24/2021 16:41    EKG: Independently reviewed.  NSR with rate 78; nonspecific ST changes with no evidence of acute ischemia   Labs on Admission: I have personally reviewed the available labs and imaging studies at the time of the admission.  Pertinent labs:  WBC 11.6 (12.0-13.2),  BUN 45,  creatinine 1.61,  troponin 32   Assessment/Plan Principal Problem:   Failure to thrive in adult 87 year old female presenting with failure to thrive with weight loss, poor p.o. intake, AKI, vomiting and abdominal pain for 1 month. -Concern for malignancy based off CT findings -will admit for IV fluids, antiemetics, nutrition consult and further work-up per below -PT/OT has also been ordered due to weakness/FTT and fall risk.   Active Problems:   possible sigmoid colon mass -CT with concerning findings. Abnormal wall thickening in the sigmoid colon. Possible circumferential mass in sigmoid colon measuring approximately 8.2x 3.4cm as well as abdominal pain, change in stool -normal hx of colonoscopies, +FH of colon cancer in her father  -also has thickening of omentum -concern for malignancy, CEA ordered -GI consulted -liquid diet    Ascites -concern for malignancy. Also  has findings concerning for cirrhosis on CT  -checking ascites ultrasound.  -? If needed to do paracentesis for diagnostic purposes, xarelto held.  -thickening of omentum  -no hx of alcohol use, will check hepatitis panel. Liver enzymes wnl.     Acute kidney injury superimposed on chronic kidney disease (HCC) -poor PO intake in setting of vomiting.  -IVF overnight -on no nephrotoxic drugs with no hx of recent NSAID use.  -continue to follow -intake/output  -has never been told had CKD, suspect she has some underlying CKD from past labs.     Paroxysmal atrial fibrillation -in NSR today -telemetry  -hold xarelto in case of procedure tomorrow -CHADSVASC=5 -Diltiazem stopped due to junctional rhythm.  On no rate controlling agents.    Elevated troponin -flat troponin, no chest pain. Ekg with no changes  -continue to monitor on telemetry  Leukocytosis -chronic history >2 years, at baseline. Continue to trend, may need w/u done outpatient.   History hypertension On no medication continue to follow  Infrarenal abdominal aortic aneurysm -2.9cm, follow outpatient    Body mass index is 20.9 kg/m.    Level of care: Telemetry Medical DVT prophylaxis:  TED hose  Code Status:  Full - confirmed with patient/family Family Communication: daughter at bedside: Hannah Stein  Disposition Plan:  The patient is from: home  Anticipated d/c is to: SNF home per day team Patient placed in observation with anticipation of less than 2 midnight stay.  Needs hospitalization for IV fluids, IV medication, monitoring and consults with specialists.  Patient is currently: stable  Consults called: GI  Admission status:  observation   Dragon dictation used in completing this note.    Orma Flaming MD [facility]   How to contact the [facility] Attending or Consulting provider [place] or covering provider during after hours [place], for this patient?  Check the care team in [facility] and look for a)  attending/consulting [facility] provider listed and b) the [facility] team listed Log into [email or web address] and use [place]'s universal password to access. If you do not have the password, please contact the hospital operator. Locate the [facility] provider you are looking for under [facility] and page to a number that you can be directly reached. If you still have difficulty reaching the provider, please page the [facility] (Director on Call) for the Hospitalists listed on amion for assistance.   04/07/2021, 5:34 PM

## 2021-04-29 NOTE — Progress Notes (Signed)
Patient arrived to unit with clothes and shoes only. Daughter made ware of patients arrival to unit. Password to be documented in flow sheet.

## 2021-04-29 NOTE — ED Provider Notes (Signed)
Patient signed out to me at 3 PM.  Fairly healthy at baseline.  Is on blood thinner for what looks like DVT in the past.  Patient states that she has had abdominal discomfort for the past month or so and now with difficulty with desire and ability to hold down food and drink.  She has had nausea, vomiting, diarrhea.  Mostly upper abdominal pain and some distention.  She lives by herself.  She has been too weak to get up and walk around.  She has had a lot of fatigue.  EKG showed sinus rhythm.  Troponin mildly elevated at 32.  Not having chest pain but is having upper abdominal pain and could be similar.  Viral test is negative.  Mild leukocytosis but no significant anemia or electrolyte abnormality otherwise.  Creatinine is 1.6.  Liver enzymes within normal limits.  Lipase normal.  Kidney function mildly above baseline.  CT scan concerning for malignant process.  No evidence of obstruction or pneumoperitoneum.  However there is some abnormal circumferential wall thickening in the sigmoid colon.  There is also cirrhosis of the liver with some ascites as well as abnormal thickening of the omentum which also could be concerning for chronic inflammation or neoplastic process.  Overall patient not tolerating p.o. and with failure to thrive symptoms.  Do not think that she has infectious process and concerned about possible oncology process given her symptoms.  We will admit her for further symptomatic management.  She will likely need endoscopy at some point to evaluate this masslike structure in her sigmoid colon.  May benefit from a paracentesis as that may help yield a diagnosis as well.    Patient to be admitted to medicine.  This chart was dictated using voice recognition software.  Despite best efforts to proofread,  errors can occur which can change the documentation meaning.      Lennice Sites, DO 05/05/2021 1713

## 2021-04-29 NOTE — Progress Notes (Signed)
Patient arrived to unit at this time, skin assessment performed with Probation officer and Administrator. Patient noted to have dry flakey skin, lower extremeties have doctors ordered compression stockings that patient did not want to take off. Patient noted to have blanchable sacral redness, foam dressing applied. Patient also noted to have pressure redness to both elbows and small skin tear to right elbow. Foams applied to both elbows. Patient noted to have distended abd. Patient also noted to have large mass to right medial neck which she states she has had for a while and is not quite sure what it is. Patient alert and oriented x4, has been oriented to unit, made aware how to use call bell. Safety measures implemented and will continue to monitor at this time.   Tele on.

## 2021-04-29 NOTE — ED Triage Notes (Signed)
Abdominal pain, N/V/D x 1 week with abdominal distension.

## 2021-04-30 ENCOUNTER — Observation Stay (HOSPITAL_COMMUNITY): Payer: Medicare Other

## 2021-04-30 ENCOUNTER — Other Ambulatory Visit: Payer: Self-pay

## 2021-04-30 DIAGNOSIS — M47816 Spondylosis without myelopathy or radiculopathy, lumbar region: Secondary | ICD-10-CM | POA: Diagnosis not present

## 2021-04-30 DIAGNOSIS — N1831 Chronic kidney disease, stage 3a: Secondary | ICD-10-CM | POA: Diagnosis present

## 2021-04-30 DIAGNOSIS — J439 Emphysema, unspecified: Secondary | ICD-10-CM | POA: Diagnosis not present

## 2021-04-30 DIAGNOSIS — I129 Hypertensive chronic kidney disease with stage 1 through stage 4 chronic kidney disease, or unspecified chronic kidney disease: Secondary | ICD-10-CM | POA: Diagnosis present

## 2021-04-30 DIAGNOSIS — K5730 Diverticulosis of large intestine without perforation or abscess without bleeding: Secondary | ICD-10-CM | POA: Diagnosis not present

## 2021-04-30 DIAGNOSIS — K56609 Unspecified intestinal obstruction, unspecified as to partial versus complete obstruction: Secondary | ICD-10-CM | POA: Diagnosis not present

## 2021-04-30 DIAGNOSIS — J449 Chronic obstructive pulmonary disease, unspecified: Secondary | ICD-10-CM | POA: Diagnosis present

## 2021-04-30 DIAGNOSIS — K648 Other hemorrhoids: Secondary | ICD-10-CM | POA: Diagnosis not present

## 2021-04-30 DIAGNOSIS — K766 Portal hypertension: Secondary | ICD-10-CM | POA: Diagnosis not present

## 2021-04-30 DIAGNOSIS — D490 Neoplasm of unspecified behavior of digestive system: Secondary | ICD-10-CM | POA: Diagnosis not present

## 2021-04-30 DIAGNOSIS — C187 Malignant neoplasm of sigmoid colon: Secondary | ICD-10-CM | POA: Diagnosis not present

## 2021-04-30 DIAGNOSIS — J9383 Other pneumothorax: Secondary | ICD-10-CM | POA: Diagnosis present

## 2021-04-30 DIAGNOSIS — K529 Noninfective gastroenteritis and colitis, unspecified: Secondary | ICD-10-CM | POA: Diagnosis not present

## 2021-04-30 DIAGNOSIS — R112 Nausea with vomiting, unspecified: Secondary | ICD-10-CM | POA: Diagnosis not present

## 2021-04-30 DIAGNOSIS — K644 Residual hemorrhoidal skin tags: Secondary | ICD-10-CM | POA: Diagnosis not present

## 2021-04-30 DIAGNOSIS — D72829 Elevated white blood cell count, unspecified: Secondary | ICD-10-CM | POA: Diagnosis not present

## 2021-04-30 DIAGNOSIS — J9811 Atelectasis: Secondary | ICD-10-CM | POA: Diagnosis not present

## 2021-04-30 DIAGNOSIS — I4891 Unspecified atrial fibrillation: Secondary | ICD-10-CM | POA: Diagnosis not present

## 2021-04-30 DIAGNOSIS — I248 Other forms of acute ischemic heart disease: Secondary | ICD-10-CM | POA: Diagnosis present

## 2021-04-30 DIAGNOSIS — I7143 Infrarenal abdominal aortic aneurysm, without rupture: Secondary | ICD-10-CM | POA: Diagnosis present

## 2021-04-30 DIAGNOSIS — R918 Other nonspecific abnormal finding of lung field: Secondary | ICD-10-CM | POA: Diagnosis not present

## 2021-04-30 DIAGNOSIS — K56600 Partial intestinal obstruction, unspecified as to cause: Secondary | ICD-10-CM | POA: Diagnosis not present

## 2021-04-30 DIAGNOSIS — R188 Other ascites: Secondary | ICD-10-CM | POA: Diagnosis not present

## 2021-04-30 DIAGNOSIS — Z20822 Contact with and (suspected) exposure to covid-19: Secondary | ICD-10-CM | POA: Diagnosis not present

## 2021-04-30 DIAGNOSIS — I480 Paroxysmal atrial fibrillation: Secondary | ICD-10-CM | POA: Diagnosis present

## 2021-04-30 DIAGNOSIS — J90 Pleural effusion, not elsewhere classified: Secondary | ICD-10-CM | POA: Diagnosis not present

## 2021-04-30 DIAGNOSIS — E870 Hyperosmolality and hypernatremia: Secondary | ICD-10-CM | POA: Diagnosis not present

## 2021-04-30 DIAGNOSIS — J939 Pneumothorax, unspecified: Secondary | ICD-10-CM | POA: Diagnosis not present

## 2021-04-30 DIAGNOSIS — R627 Adult failure to thrive: Secondary | ICD-10-CM | POA: Diagnosis not present

## 2021-04-30 DIAGNOSIS — K6389 Other specified diseases of intestine: Secondary | ICD-10-CM | POA: Diagnosis not present

## 2021-04-30 DIAGNOSIS — R64 Cachexia: Secondary | ICD-10-CM | POA: Diagnosis not present

## 2021-04-30 DIAGNOSIS — N189 Chronic kidney disease, unspecified: Secondary | ICD-10-CM | POA: Diagnosis not present

## 2021-04-30 DIAGNOSIS — K7460 Unspecified cirrhosis of liver: Secondary | ICD-10-CM | POA: Diagnosis not present

## 2021-04-30 DIAGNOSIS — R109 Unspecified abdominal pain: Secondary | ICD-10-CM | POA: Diagnosis not present

## 2021-04-30 DIAGNOSIS — R933 Abnormal findings on diagnostic imaging of other parts of digestive tract: Secondary | ICD-10-CM | POA: Diagnosis not present

## 2021-04-30 DIAGNOSIS — R778 Other specified abnormalities of plasma proteins: Secondary | ICD-10-CM | POA: Diagnosis not present

## 2021-04-30 DIAGNOSIS — R634 Abnormal weight loss: Secondary | ICD-10-CM | POA: Diagnosis not present

## 2021-04-30 DIAGNOSIS — E43 Unspecified severe protein-calorie malnutrition: Secondary | ICD-10-CM | POA: Diagnosis not present

## 2021-04-30 DIAGNOSIS — L8990 Pressure ulcer of unspecified site, unspecified stage: Secondary | ICD-10-CM | POA: Insufficient documentation

## 2021-04-30 DIAGNOSIS — N179 Acute kidney failure, unspecified: Secondary | ICD-10-CM | POA: Diagnosis not present

## 2021-04-30 DIAGNOSIS — Z4682 Encounter for fitting and adjustment of non-vascular catheter: Secondary | ICD-10-CM | POA: Diagnosis not present

## 2021-04-30 DIAGNOSIS — E8721 Acute metabolic acidosis: Secondary | ICD-10-CM | POA: Diagnosis not present

## 2021-04-30 DIAGNOSIS — Z515 Encounter for palliative care: Secondary | ICD-10-CM | POA: Diagnosis not present

## 2021-04-30 DIAGNOSIS — R110 Nausea: Secondary | ICD-10-CM | POA: Diagnosis not present

## 2021-04-30 DIAGNOSIS — Z66 Do not resuscitate: Secondary | ICD-10-CM | POA: Diagnosis not present

## 2021-04-30 DIAGNOSIS — E875 Hyperkalemia: Secondary | ICD-10-CM | POA: Diagnosis present

## 2021-04-30 DIAGNOSIS — K659 Peritonitis, unspecified: Secondary | ICD-10-CM | POA: Diagnosis not present

## 2021-04-30 DIAGNOSIS — I3139 Other pericardial effusion (noninflammatory): Secondary | ICD-10-CM | POA: Diagnosis not present

## 2021-04-30 LAB — BODY FLUID CELL COUNT WITH DIFFERENTIAL
Eos, Fluid: 0 %
Lymphs, Fluid: 72 %
Monocyte-Macrophage-Serous Fluid: 28 % — ABNORMAL LOW (ref 50–90)
Neutrophil Count, Fluid: 0 % (ref 0–25)
Total Nucleated Cell Count, Fluid: 161 cu mm (ref 0–1000)

## 2021-04-30 LAB — COMPREHENSIVE METABOLIC PANEL
ALT: 23 U/L (ref 0–44)
AST: 25 U/L (ref 15–41)
Albumin: 2.4 g/dL — ABNORMAL LOW (ref 3.5–5.0)
Alkaline Phosphatase: 46 U/L (ref 38–126)
Anion gap: 7 (ref 5–15)
BUN: 40 mg/dL — ABNORMAL HIGH (ref 8–23)
CO2: 26 mmol/L (ref 22–32)
Calcium: 9.3 mg/dL (ref 8.9–10.3)
Chloride: 109 mmol/L (ref 98–111)
Creatinine, Ser: 1.34 mg/dL — ABNORMAL HIGH (ref 0.44–1.00)
GFR, Estimated: 38 mL/min — ABNORMAL LOW (ref >60)
Glucose, Bld: 94 mg/dL (ref 70–99)
Potassium: 3.8 mmol/L (ref 3.5–5.1)
Sodium: 142 mmol/L (ref 135–145)
Total Bilirubin: 0.9 mg/dL (ref 0.3–1.2)
Total Protein: 4.5 g/dL — ABNORMAL LOW (ref 6.5–8.1)

## 2021-04-30 LAB — CBC
HCT: 41.7 % (ref 36.0–46.0)
Hemoglobin: 13.1 g/dL (ref 12.0–15.0)
MCH: 27.5 pg (ref 26.0–34.0)
MCHC: 31.4 g/dL (ref 30.0–36.0)
MCV: 87.6 fL (ref 80.0–100.0)
Platelets: 280 10*3/uL (ref 150–400)
RBC: 4.76 MIL/uL (ref 3.87–5.11)
RDW: 14.1 % (ref 11.5–15.5)
WBC: 9.7 10*3/uL (ref 4.0–10.5)
nRBC: 0 % (ref 0.0–0.2)

## 2021-04-30 LAB — HEPATITIS PANEL, ACUTE
HCV Ab: NONREACTIVE
Hep A IgM: NONREACTIVE
Hep B C IgM: NONREACTIVE
Hepatitis B Surface Ag: NONREACTIVE

## 2021-04-30 LAB — ALBUMIN, PLEURAL OR PERITONEAL FLUID: Albumin, Fluid: 1.9 g/dL

## 2021-04-30 LAB — PROTEIN, PLEURAL OR PERITONEAL FLUID: Total protein, fluid: <3 g/dL

## 2021-04-30 MED ORDER — PEG 3350-KCL-NA BICARB-NACL 420 G PO SOLR
4000.0000 mL | Freq: Once | ORAL | Status: AC
  Administered 2021-04-30: 4000 mL via ORAL
  Filled 2021-04-30: qty 4000

## 2021-04-30 MED ORDER — LIDOCAINE HCL (PF) 1 % IJ SOLN
INTRAMUSCULAR | Status: AC
  Filled 2021-04-30: qty 30

## 2021-04-30 MED ORDER — MUPIROCIN 2 % EX OINT
1.0000 "application " | TOPICAL_OINTMENT | Freq: Two times a day (BID) | CUTANEOUS | Status: AC
  Administered 2021-04-30 – 2021-05-05 (×8): 1 via NASAL
  Filled 2021-04-30 (×2): qty 22

## 2021-04-30 NOTE — Consult Note (Signed)
Referring Provider:  [facility] Primary Care Physician:  Kelton Pillar, MD Primary Gastroenterologist: Sadie Haber  primary  Reason for Consultation: Abnormal CT scan  HPI: Hannah Stein is a 87 y.o. female with past medical history of paroxysmal atrial fibrillation on Xarelto and history of COPD presented to the hospital with abdominal pain, weight loss and poor oral intake.  Upon initial evaluation yesterday, blood work showed mild leukocytosis which is chronic, normal hemoglobin, mildly elevated creatinine 1.61, normal LFTs and normal lipase.  Negative hepatitis panel.  CT abdomen pelvis with contrast showed abnormal circumferential wall thickening with possible mass of around 8 cm in the sigmoid colon concerning for malignancy.  It also showed cirrhosis of the liver with large ascites as well as abnormal thickening of the omentum.  Patient seen and examined at bedside.  She has been having intermittent diarrhea for last 2 months.  Denies seeing any blood in the stool or black stool.  Denies nausea or vomiting.  Complaining of decreased appetite.  Not sure of weight loss.  Last colonoscopy several years ago.  Father died of possible colon cancer. Past Medical History:  Diagnosis Date   DVT, lower extremity ([place])     Past Surgical History:  Procedure Laterality Date   THROAT SURGERY      Prior to Admission medications   Medication Sig Start Date End Date Taking? Authorizing Provider  Cholecalciferol (VITAMIN D) 50 MCG (2000 UT) CAPS Take 1 capsule by mouth 2 (two) times daily.   Yes [provider]  Rivaroxaban (XARELTO) 15 MG TABS tablet Take 1 tablet (15 mg total) by mouth daily with supper. 04/11/20  Yes Buford Dresser, MD  vitamin B-12 (CYANOCOBALAMIN) 500 MCG tablet Take 500 mcg by mouth daily.   Yes [provider]    Scheduled Meds:  cholecalciferol  2,000 Units Oral BID   pantoprazole (PROTONIX) IV  40 mg Intravenous Q24H   vitamin B-12  500 mcg Oral Daily    Continuous Infusions: PRN Meds:.acetaminophen **OR** acetaminophen, albuterol, HYDROmorphone (DILAUDID) injection, ondansetron **OR** ondansetron (ZOFRAN) IV, oxyCODONE  Allergies as of 04/09/2021   (No Known Allergies)    Family History  Problem Relation Age of Onset   Heart failure Mother    CVA Mother        "mini strokes"    Social History   Socioeconomic History   Marital status: Divorced    Spouse name: Not on file   Number of children: Not on file   Years of education: Not on file   Highest education level: Not on file  Occupational History   Not on file  Tobacco Use   Smoking status: Former    Types: Cigarettes    Quit date: 07/22/1982    Years since quitting: 38.8   Smokeless tobacco: Never  Substance and Sexual Activity   Alcohol use: No   Drug use: No   Sexual activity: Not on file  Other Topics Concern   Not on file  Social History Narrative   Not on file   Social Determinants of Health   Financial Resource Strain: Not on file  Food Insecurity: Not on file  Transportation Needs: Not on file  Physical Activity: Not on file  Stress: Not on file  Social Connections: Not on file  Intimate Partner Violence: Not on file    Review of Systems: 12 point review of system is done which is negative except as mentioned in HPI  Physical Exam: Vital signs: Vitals:   04/22/2021 2216  04/30/21 0322  BP: 127/60 (!) 110/50  Pulse: 79 74  Resp: (!) 25 20  Temp: (!) 97 F (36.1 C) 97.6 F (36.4 C)  SpO2:  92%   Last BM Date: 04/12/2021 General:   Elderly patient, resting comfortably, not in acute distress HEENT: NS, AT, EOMI Lungs:  Clear throughout to auscultation.   Anterior exam only.   heart: Irregular rate and rhythm Abdomen: Abdomen is distended, bowel sounds present, no peritoneal signs.  Abdomen is nontender. Neuro : Alert and oriented x3 Psych : mood and affect normal Rectal:  Deferred  GI:  Lab Results: Recent Labs    04/16/2021 1400  04/30/21 0120  WBC 11.6* 9.7  HGB 15.0 13.1  HCT 47.8* 41.7  PLT 342 280   BMET Recent Labs    04/15/2021 1400 04/30/21 0120  NA 143 142  K 4.0 3.8  CL 108 109  CO2 26 26  GLUCOSE 110* 94  BUN 45* 40*  CREATININE 1.61* 1.34*  CALCIUM 9.9 9.3   LFT Recent Labs    04/30/21 0120  PROT 4.5*  ALBUMIN 2.4*  AST 25  ALT 23  ALKPHOS 46  BILITOT 0.9   PT/INR Recent Labs    04/13/2021 1722  LABPROT 15.3*  INR 1.2     Studies/Results: CT ABDOMEN PELVIS W CONTRAST  Result Date: 04/21/2021 CLINICAL DATA:  Abdominal pain EXAM: CT ABDOMEN AND PELVIS WITH CONTRAST TECHNIQUE: Multidetector CT imaging of the abdomen and pelvis was performed using the standard protocol following bolus administration of intravenous contrast. CONTRAST:  57mL OMNIPAQUE IOHEXOL 300 MG/ML  SOLN COMPARISON:  None. FINDINGS: Lower chest: Centrilobular and panlobular emphysema is seen. There is small pericardial effusion. Small bilateral pleural effusions are seen, more so on the right side. There is fluid in the lumen of lower thoracic esophagus suggesting gastroesophageal reflux. Hepatobiliary: Liver measures 10.1 cm in length. There is nodularity in the liver surface suggesting cirrhosis. There is no dilation of bile ducts. There is mild wall thickening in gallbladder which may be due to chronic cholecystitis or related to ascites. Pancreas: No focal abnormality is seen Spleen: Spleen measures 10.2 cm in maximum diameter. No focal abnormality is seen. Adrenals/Urinary Tract: Adrenals are unremarkable. There is no hydronephrosis. There is contrast in the pelvocaliceal systems in the kidneys limiting evaluation for small nonobstructing stones. There is no hydronephrosis. There is 3.7 cm fluid density structure in the midportion of left kidney suggesting renal cyst. Stomach/Bowel: Small hiatal hernia is seen. Stomach is not distended. Small bowel loops are not dilated. Appendix is not distinctly seen. There is no focal  pericecal inflammation. Scattered diverticula are seen in the colon without definite signs of focal diverticulitis. There is abnormal wall thickening in the sigmoid colon. There is possible circumferential mass in the sigmoid colon rmeasuring approximately 8.2 x 3.4 cm. Vascular/Lymphatic: Extensive atherosclerotic plaques and calcifications are seen in the abdominal aorta and its major branches. There is 2.9 cm infrarenal aortic aneurysm. There is abnormal thickening of omentum. Reproductive: Unremarkable Other: Large ascites is present.  There is no pneumoperitoneum. Musculoskeletal: Degenerative changes are noted in the lumbar spine more so at L3-L4 and L4-L5 levels. IMPRESSION: There is no evidence of intestinal obstruction or pneumoperitoneum. There is no hydronephrosis. There is abnormal circumferential wall thickening in the sigmoid colon. Possibility of a malignant neoplastic process is not excluded. Endoscopy as clinically warranted should be considered. 2.9 cm infrarenal abdominal aortic aneurysm. Diverticulosis of colon without signs of focal acute diverticulitis. Cirrhosis of  liver. Large ascites. There is abnormal thickening of omentum which may be due to chronic inflammation or neoplastic process. Small hiatal hernia. Possible gastroesophageal reflux. Small pericardial effusion. Small bilateral pleural effusions. COPD. Other findings as described in the body of the report. Electronically Signed   By: Elmer Picker M.D.   On: 04/25/2021 16:41   [place] ASCITES (ABDOMEN LIMITED)  Result Date: 04/20/2021 CLINICAL DATA:  Assess for ascites EXAM: LIMITED ABDOMEN ULTRASOUND FOR ASCITES TECHNIQUE: Limited ultrasound survey for ascites was performed in all four abdominal quadrants. COMPARISON:  CT 04/16/2021 FINDINGS: Ultrasound evaluation of the 4 quadrants is performed. Large volume of ascites within the abdomen, greatest in the upper quadrants. Incidental note made of a left renal cyst. IMPRESSION:  Large volume of ascites. Electronically Signed   By: Donavan Foil M.D.   On: 04/11/2021 19:47    Impression/Plan: -Abnormal CT scan concerning for sigmoid colon mass.  There is also concern for thickening of the omentum concerning for malignancy or secondary to chronic inflammation. -Cirrhosis of the liver with large volume ascites.  MELD 11 -History of atrial fibrillation.  Was on Xarelto.  Last dose 2 days ago. -Decreased appetite and weight loss.  Concerning for malignancy.  Recommendations -------------------------- -Plan for colonoscopy tomorrow. -If patient has a colon cancer, she may be at moderate to high risk of surgery because of cirrhosis of the liver with ascites. -Recommend paracentesis with fluid analysis including cytology. -Called and discussed with patient's daughter over the phone. -Discussed with hospitalist at bedside. -Okay to have clear liquid diet today.  Keep n.p.o. past midnight -follow CEA  Risks (bleeding, infection, bowel perforation that could require surgery, sedation-related changes in cardiopulmonary systems), benefits (identification and possible treatment of source of symptoms, exclusion of certain causes of symptoms), and alternatives (watchful waiting, radiographic imaging studies, empiric medical treatment)  were explained to patient/family in detail and patient wishes to proceed.   LOS: 0 days   Otis Brace  MD, FACP 04/30/2021, 8:43 AM  Contact #  [phone]

## 2021-04-30 NOTE — Procedures (Signed)
PROCEDURE SUMMARY:  Successful US guided paracentesis from RLQ.  Yielded 4 L of clear dark yellow fluid.  No immediate complications.  Pt tolerated well.   Specimen was sent for labs.  EBL < 58mL  Ascencion Dike PA-C 04/30/2021 11:19 AM

## 2021-04-30 NOTE — Plan of Care (Signed)
°  Problem: Clinical Measurements: °Goal: Will remain free from infection °Outcome: Progressing °  °Problem: Activity: °Goal: Risk for activity intolerance will decrease °Outcome: Progressing °  °Problem: Nutrition: °Goal: Adequate nutrition will be maintained °Outcome: Progressing °  °Problem: Elimination: °Goal: Will not experience complications related to bowel motility °Outcome: Progressing °  °

## 2021-04-30 NOTE — Evaluation (Signed)
Physical Therapy Evaluation Patient Details Name: Hannah Stein MRN: [id number] DOB: 30-Jul-1928 Today's Date: 04/30/2021  History of Present Illness  pt is a 87 y/o female admitted 11/23 with abdominal pain, N/V and poor PO intake with weight loss.  CT pelvis showing a signoid colon mass measuring 8.2 x 3.4 cm.  PMHx: PAF, COPD  Clinical Impression  Pt admitted with/for the problems stated above.  Pt very weak at this point for multiple reason, but including having little PO intake.  Pt needing min assist for basic mobility and gait.Marland Kitchen  Pt currently limited functionally due to the problems listed below.  (see problems list.)  Pt will benefit from PT to maximize function and safety to be able to get home safely with available assist.        Recommendations for follow up therapy are one component of a multi-disciplinary discharge planning process, led by the attending physician.  Recommendations may be updated based on patient status, additional functional criteria and insurance authorization.  Follow Up Recommendations Home health PT    Assistance Recommended at Discharge Intermittent Supervision/Assistance (if family can provide at or greater than intermittent assist o/w may need SNF)  Functional Status Assessment Patient has had a recent decline in their functional status and demonstrates the ability to make significant improvements in function in a reasonable and predictable amount of time.  Equipment Recommendations  Other (comment) (TBD)    Recommendations for Other Services       Precautions / Restrictions Precautions Precautions: Fall      Mobility  Bed Mobility Overal bed mobility: Needs Assistance Bed Mobility: Supine to Sit     Supine to sit: Min assist     General bed mobility comments: pt needed truncal assist up via L elbow then able to bring herself up to sitting.    Transfers Overall transfer level: Needs assistance Equipment used: 1 person hand held  assist Transfers: Sit to/from Stand Sit to Stand: Min guard;Min assist (depending on height)           General transfer comment: cues for hand placement, min assist forward and minor boost from lower surface.    Ambulation/Gait Ambulation/Gait assistance: Min assist Gait Distance (Feet): 30 Feet (then 15 feet around the bed to the chair.) Assistive device: 1 person hand held assist;IV Pole Gait Pattern/deviations: Step-through pattern;Decreased step length - right;Decreased step length - left;Decreased stride length   Gait velocity interpretation: <1.8 ft/sec, indicate of risk for recurrent falls   General Gait Details: short guarded steps, mild unsteadiness.  pt reporting feeling week.  Stairs            Wheelchair Mobility    Modified Rankin (Stroke Patients Only)       Balance Overall balance assessment: Needs assistance Sitting-balance support: No upper extremity supported Sitting balance-Leahy Scale: Fair     Standing balance support: Single extremity supported;During functional activity Standing balance-Leahy Scale: Fair Standing balance comment: reliant on external support for optimal safety                             Pertinent Vitals/Pain Pain Assessment: Faces Faces Pain Scale: Hurts a little bit Pain Location: general Pain Intervention(s): Monitored during session    Home Living Family/patient expects to be discharged to:: Private residence Living Arrangements: Alone Available Help at Discharge: Family;Available PRN/intermittently Type of Home: House Home Access: Level entry       Home Layout: One level Home  Equipment: Conservation officer, nature (2 wheels)      Prior Function Prior Level of Function : Independent/Modified Independent                     Hand Dominance        Extremity/Trunk Assessment   Upper Extremity Assessment Upper Extremity Assessment: Generalized weakness    Lower Extremity Assessment Lower  Extremity Assessment: Generalized weakness    Cervical / Trunk Assessment Cervical / Trunk Assessment: Normal  Communication   Communication: No difficulties  Cognition Arousal/Alertness: Awake/alert Behavior During Therapy: WFL for tasks assessed/performed Overall Cognitive Status: Within Functional Limits for tasks assessed                                          General Comments General comments (skin integrity, edema, etc.): vss    Exercises     Assessment/Plan    PT Assessment Patient needs continued PT services  PT Problem List Decreased strength;Decreased activity tolerance;Decreased mobility;Pain       PT Treatment Interventions Gait training;Functional mobility training;Therapeutic activities;Patient/family education    PT Goals (Current goals can be found in the Care Plan section)  Acute Rehab PT Goals Patient Stated Goal: pt's dtr wished for pt to get back to PLOF and able to stay at home. PT Goal Formulation: With patient Time For Goal Achievement: 05/14/21 Potential to Achieve Goals: Fair    Frequency Min 3X/week   Barriers to discharge        Co-evaluation               AM-PAC PT "6 Clicks" Mobility  Outcome Measure Help needed turning from your back to your side while in a flat bed without using bedrails?: A Little Help needed moving from lying on your back to sitting on the side of a flat bed without using bedrails?: A Little Help needed moving to and from a bed to a chair (including a wheelchair)?: A Little Help needed standing up from a chair using your arms (e.g., wheelchair or bedside chair)?: A Little Help needed to walk in hospital room?: A Little Help needed climbing 3-5 steps with a railing? : A Little 6 Click Score: 18    End of Session   Activity Tolerance: Patient tolerated treatment well Patient left: in chair;with call bell/phone within reach Nurse Communication: Mobility status PT Visit Diagnosis:  Unsteadiness on feet (R26.81);Other abnormalities of gait and mobility (R26.89);Difficulty in walking, not elsewhere classified (R26.2);Muscle weakness (generalized) (M62.81)    Time: 1308-6578 PT Time Calculation (min) (ACUTE ONLY): 35 min   Charges:   PT Evaluation $PT Eval Moderate Complexity: 1 Mod PT Treatments $Gait Training: 8-22 mins        04/30/2021  Ginger Carne., PT Acute Rehabilitation Services [phone]  (pager) [phone]  (office)  Tessie Fass Athel Merriweather 04/30/2021, 1:41 PM 04/30/2021  Ginger Carne., PT Acute Rehabilitation Services [phone]  (pager) [phone]  (office)

## 2021-04-30 NOTE — Progress Notes (Addendum)
PROGRESS NOTE    Hannah Stein  JGG:[id number] DOB: 1929/03/22 DOA: 04/28/2021 PCP: Kelton Pillar, MD   Brief Narrative:  HPI: Hannah Stein is a 87 y.o. female with medical history significant of PAF on xarelto, COPD,  who presented with abdominal pain, N/V and poor PO intake with weight loss. Her daughter is here and tells most of history. She tells me when she called her last night her mom was out of character and a little short with her. When they checked on her today they had concerns. She was really weak and could hardly talk. They also found out she has had abdominal pain since middle of October. Pain is epigastric and rates 10/10. Pain starts in bottom and radiates up to her epigastric area. Pain is constant. Pain described as aching/stabbing. she has also asked for tums. They also found out she has not eaten well since mid October "because it doesn't taste good." Her daughter states she hasn't eaten much of anything. They also feel like she has lost a lot of weight, but unsure of how much. (>10 pounds). She has also had nausea and vomiting since last week. She has had about one episode of vomiting, but has had numerous episodes of burping and spitting up.    Colonoscopy around 42-7 years of age. Report normal except for hemorrhoids. [facility]. History of colon cancer in her father. She reports having blood in her stool "once in awhile." She has noticed over the past few weeks her stool is "runny." No solid stool.    She denies any fever/chills, chest pain/palpitations, she has chronic shortness of breath at her baseline, no cough, no dysuria. +leg swelling.        ED Course: vitals: Afebrile, blood pressure 130/60, heart rate 80, respiratory rate 24, oxygen 97% on room air. Pertinent labs: WBC 11.6 (12.0-13.2), BUN 45, creatinine 1.61, troponin 32 CT abdomen pelvis: Abnormal circumferential wall thickening in the sigmoid colon possible mass measuring 8.2 x 3.4 cm.  Possibility of  malignant neoplastic process not excluded.  2.9 cm infrarenal abdominal aortic aneurysm.  Cirrhosis of liver with large ascites.  Also abnormal thickening of the omentum.  Mild pericardial effusion, small bilateral pleural effusions, COPD. IN ED patient given 500 mL bolus, Zofran, Protonix and fentanyl.  TRH was asked to admit.      Assessment & Plan:   Principal Problem:   Failure to thrive in adult Active Problems:   Leukocytosis   Paroxysmal atrial fibrillation (HCC)   Acute kidney injury superimposed on chronic kidney disease (HCC)   Ascites   possible sigmoid colon mass   Elevated troponin   Pressure injury of skin  Failure to thrive in adult/denies weakness: Likely secondary to malignancy.  Nutrition, PT, OT consulted.   sigmoid colon mass with possible cancer: -CT with concerning findings. Abnormal wall thickening in the sigmoid colon. Possible circumferential mass in sigmoid colon measuring approximately 8.2x 3.4cm.  Follow CEA.  Seen by GI.  Plan for colonoscopy tomorrow.  Remains on clear liquid diet.   Hepatic cirrhosis with large ascites: Moderate to large ascites on exam but she is comfortable.  I have requested IR to do therapeutic paracentesis.  Labs are ordered.   AKI on CKD stage IIIa: Baseline creatinine seems to be around 1.1 GFR 45.  Presented with creatinine of 1.61 and GFR 30.  Improving.  This is likely secondary to dehydration.   Paroxysmal atrial fibrillation: Rate controlled.  Diltiazem stopped due to junctional rhythm.  Rates are controlled at this point in time.  Xarelto on hold for colonoscopy and possible need of biopsy tomorrow.   Elevated troponin: Very small elevation and flat with no ACS symptoms, indicates demand ischemia.  Will update echo.  She will need that for surgery if she were to have that surgery as well.    Leukocytosis: Nonspecific and resolved.  History hypertension: Blood pressure normal here and she is not on any medications either.    Infrarenal abdominal aortic aneurysm -2.9cm, follow outpatient   DVT prophylaxis: Place TED hose Start: 05/04/2021 1846   Code Status: Full Code  Family Communication:  None present at bedside.  Plan of care discussed with patient in length and he verbalized understanding and agreed with it.  Status is: Observation  The patient will require care spanning > 2 midnights and should be moved to inpatient because: Scheduled for colonoscopy for sigmoid mass tomorrow.  Estimated body mass index is 21.36 kg/m as calculated from the following:   Height as of this encounter: 5\' 3"  (1.6 m).   Weight as of this encounter: 54.7 kg.  Pressure Injury 04/10/2021 Elbow Posterior;Left Stage 2 -  Partial thickness loss of dermis presenting as a shallow open injury with a red, pink wound bed without slough. Red, open (Active)  04/24/2021 2300  Location: Elbow  Location Orientation: Posterior;Left  Staging: Stage 2 -  Partial thickness loss of dermis presenting as a shallow open injury with a red, pink wound bed without slough.  Wound Description (Comments): Red, open  Present on Admission: Yes    Nutritional Assessment: Body mass index is 21.36 kg/m.Marland Kitchen Seen by dietician.  I agree with the assessment and plan as outlined below: Nutrition Status:   Skin Assessment: I have examined the patient's skin and I agree with the wound assessment as performed by the wound care RN as outlined below: Pressure Injury 04/25/2021 Elbow Posterior;Left Stage 2 -  Partial thickness loss of dermis presenting as a shallow open injury with a red, pink wound bed without slough. Red, open (Active)  04/28/2021 2300  Location: Elbow  Location Orientation: Posterior;Left  Staging: Stage 2 -  Partial thickness loss of dermis presenting as a shallow open injury with a red, pink wound bed without slough.  Wound Description (Comments): Red, open  Present on Admission: Yes    Consultants:  GI  Procedures:  None  Antimicrobials:   Anti-infectives (From admission, onward)    None          Subjective: Seen and examined.  Feels better.  No abdominal pain.  No other complaint.  Objective: Vitals:   04/08/2021 2215 04/24/2021 2216 04/30/21 0322 04/30/21 0848  BP: 127/60 127/60 (!) 110/50 (!) 123/58  Pulse: (!) 56 79 74 68  Resp: (!) 26 (!) 25 20 20   Temp: (!) 97.1 F (36.2 C) (!) 97 F (36.1 C) 97.6 F (36.4 C) (!) 97.3 F (36.3 C)  TempSrc: Oral  Oral Oral  SpO2: 99%  92% 93%  Weight: 54.7 kg     Height:        Intake/Output Summary (Last 24 hours) at 04/30/2021 1003 Last data filed at 04/08/2021 1700 Gross per 24 hour  Intake 254.38 ml  Output --  Net 254.38 ml   Filed Weights   04/30/2021 1302 05/02/2021 2215  Weight: 53.5 kg 54.7 kg    Examination:  General exam: Appears calm and comfortable but cachectic. Respiratory system: Clear to auscultation. Respiratory effort normal. Cardiovascular system: S1 &  S2 heard, RRR. No JVD, murmurs, rubs, gallops or clicks. No pedal edema. Gastrointestinal system: Abdomen is nondistended, soft and nontender with positive fluid shift. No organomegaly or masses felt. Normal bowel sounds heard. Central nervous system: Alert and oriented. No focal neurological deficits. Extremities: Symmetric 5 x 5 power. Skin: No rashes, lesions or ulcers Psychiatry: Judgement and insight appear normal. Mood & affect appropriate.    Data Reviewed: I have personally reviewed following labs and imaging studies  CBC: Recent Labs  Lab 04/21/2021 1400 04/30/21 0120  WBC 11.6* 9.7  NEUTROABS 9.6*  --   HGB 15.0 13.1  HCT 47.8* 41.7  MCV 88.2 87.6  PLT 342 387   Basic Metabolic Panel: Recent Labs  Lab 04/11/2021 1400 04/30/21 0120  NA 143 142  K 4.0 3.8  CL 108 109  CO2 26 26  GLUCOSE 110* 94  BUN 45* 40*  CREATININE 1.61* 1.34*  CALCIUM 9.9 9.3   GFR: Estimated Creatinine Clearance: 23.1 mL/min (A) (by C-G formula based on SCr of 1.34 mg/dL (H)). Liver Function  Tests: Recent Labs  Lab 04/28/2021 1400 04/30/21 0120  AST 27 25  ALT 23 23  ALKPHOS 51 46  BILITOT 1.0 0.9  PROT 5.2* 4.5*  ALBUMIN 2.9* 2.4*   Recent Labs  Lab 04/28/2021 1400  LIPASE 32   No results for input(s): AMMONIA in the last 168 hours. Coagulation Profile: Recent Labs  Lab 05/05/2021 1722  INR 1.2   Cardiac Enzymes: No results for input(s): CKTOTAL, CKMB, CKMBINDEX, TROPONINI in the last 168 hours. BNP (last 3 results) No results for input(s): PROBNP in the last 8760 hours. HbA1C: No results for input(s): HGBA1C in the last 72 hours. CBG: No results for input(s): GLUCAP in the last 168 hours. Lipid Profile: No results for input(s): CHOL, HDL, LDLCALC, TRIG, CHOLHDL, LDLDIRECT in the last 72 hours. Thyroid Function Tests: No results for input(s): TSH, T4TOTAL, FREET4, T3FREE, THYROIDAB in the last 72 hours. Anemia Panel: No results for input(s): VITAMINB12, FOLATE, FERRITIN, TIBC, IRON, RETICCTPCT in the last 72 hours. Sepsis Labs: No results for input(s): PROCALCITON, LATICACIDVEN in the last 168 hours.  Recent Results (from the past 240 hour(s))  Resp Panel by RT-PCR (Flu A&B, Covid) Nasopharyngeal Swab     Status: None   Collection Time: 05/05/2021  2:55 PM   Specimen: Nasopharyngeal Swab; Nasopharyngeal(NP) swabs in vial transport medium  Result Value Ref Range Status   SARS Coronavirus 2 by RT PCR NEGATIVE NEGATIVE Final    Comment: (NOTE) SARS-CoV-2 target nucleic acids are NOT DETECTED.  The SARS-CoV-2 RNA is generally detectable in upper respiratory specimens during the acute phase of infection. The lowest concentration of SARS-CoV-2 viral copies this assay can detect is 138 copies/mL. A negative result does not preclude SARS-Cov-2 infection and should not be used as the sole basis for treatment or other patient management decisions. A negative result may occur with  improper specimen collection/handling, submission of specimen other than  nasopharyngeal swab, presence of viral mutation(s) within the areas targeted by this assay, and inadequate number of viral copies(<138 copies/mL). A negative result must be combined with clinical observations, patient history, and epidemiological information. The expected result is Negative.  Fact Sheet for Patients:  EntrepreneurPulse.com.au  Fact Sheet for Healthcare Providers:  IncredibleEmployment.be  This test is no t yet approved or cleared by the [place] FDA and  has been authorized for detection and/or diagnosis of SARS-CoV-2 by FDA under an Emergency Use Authorization (EUA). This EUA  will remain  in effect (meaning this test can be used) for the duration of the COVID-19 declaration under Section 564(b)(1) of the Act, 21 U.S.C.section 360bbb-3(b)(1), unless the authorization is terminated  or revoked sooner.       Influenza A by PCR NEGATIVE NEGATIVE Final   Influenza B by PCR NEGATIVE NEGATIVE Final    Comment: (NOTE) The Xpert Xpress SARS-CoV-2/FLU/RSV plus assay is intended as an aid in the diagnosis of influenza from Nasopharyngeal swab specimens and should not be used as a sole basis for treatment. Nasal washings and aspirates are unacceptable for Xpert Xpress SARS-CoV-2/FLU/RSV testing.  Fact Sheet for Patients: EntrepreneurPulse.com.au  Fact Sheet for Healthcare Providers: IncredibleEmployment.be  This test is not yet approved or cleared by the [place] FDA and has been authorized for detection and/or diagnosis of SARS-CoV-2 by FDA under an Emergency Use Authorization (EUA). This EUA will remain in effect (meaning this test can be used) for the duration of the COVID-19 declaration under Section 564(b)(1) of the Act, 21 U.S.C. section 360bbb-3(b)(1), unless the authorization is terminated or revoked.  Performed at [facility], [place] [address] [place], [place] [address]        Radiology Studies: CT ABDOMEN PELVIS W CONTRAST  Result Date: 04/11/2021 CLINICAL DATA:  Abdominal pain EXAM: CT ABDOMEN AND PELVIS WITH CONTRAST TECHNIQUE: Multidetector CT imaging of the abdomen and pelvis was performed using the standard protocol following bolus administration of intravenous contrast. CONTRAST:  48mL OMNIPAQUE IOHEXOL 300 MG/ML  SOLN COMPARISON:  None. FINDINGS: Lower chest: Centrilobular and panlobular emphysema is seen. There is small pericardial effusion. Small bilateral pleural effusions are seen, more so on the right side. There is fluid in the lumen of lower thoracic esophagus suggesting gastroesophageal reflux. Hepatobiliary: Liver measures 10.1 cm in length. There is nodularity in the liver surface suggesting cirrhosis. There is no dilation of bile ducts. There is mild wall thickening in gallbladder which may be due to chronic cholecystitis or related to ascites. Pancreas: No focal abnormality is seen Spleen: Spleen measures 10.2 cm in maximum diameter. No focal abnormality is seen. Adrenals/Urinary Tract: Adrenals are unremarkable. There is no hydronephrosis. There is contrast in the pelvocaliceal systems in the kidneys limiting evaluation for small nonobstructing stones. There is no hydronephrosis. There is 3.7 cm fluid density structure in the midportion of left kidney suggesting renal cyst. Stomach/Bowel: Small hiatal hernia is seen. Stomach is not distended. Small bowel loops are not dilated. Appendix is not distinctly seen. There is no focal pericecal inflammation. Scattered diverticula are seen in the colon without definite signs of focal diverticulitis. There is abnormal wall thickening in the sigmoid colon. There is possible circumferential mass in the sigmoid colon rmeasuring approximately 8.2 x 3.4 cm. Vascular/Lymphatic: Extensive atherosclerotic plaques and calcifications are seen in the abdominal aorta and its major branches. There is 2.9 cm infrarenal aortic  aneurysm. There is abnormal thickening of omentum. Reproductive: Unremarkable Other: Large ascites is present.  There is no pneumoperitoneum. Musculoskeletal: Degenerative changes are noted in the lumbar spine more so at L3-L4 and L4-L5 levels. IMPRESSION: There is no evidence of intestinal obstruction or pneumoperitoneum. There is no hydronephrosis. There is abnormal circumferential wall thickening in the sigmoid colon. Possibility of a malignant neoplastic process is not excluded. Endoscopy as clinically warranted should be considered. 2.9 cm infrarenal abdominal aortic aneurysm. Diverticulosis of colon without signs of focal acute diverticulitis. Cirrhosis of liver. Large ascites. There is abnormal thickening of omentum which may be due to  chronic inflammation or neoplastic process. Small hiatal hernia. Possible gastroesophageal reflux. Small pericardial effusion. Small bilateral pleural effusions. COPD. Other findings as described in the body of the report. Electronically Signed   By: Elmer Picker M.D.   On: 04/27/2021 16:41   [place] ASCITES (ABDOMEN LIMITED)  Result Date: 04/30/2021 CLINICAL DATA:  Assess for ascites EXAM: LIMITED ABDOMEN ULTRASOUND FOR ASCITES TECHNIQUE: Limited ultrasound survey for ascites was performed in all four abdominal quadrants. COMPARISON:  CT 04/12/2021 FINDINGS: Ultrasound evaluation of the 4 quadrants is performed. Large volume of ascites within the abdomen, greatest in the upper quadrants. Incidental note made of a left renal cyst. IMPRESSION: Large volume of ascites. Electronically Signed   By: Donavan Foil M.D.   On: 04/07/2021 19:47    Scheduled Meds:  cholecalciferol  2,000 Units Oral BID   pantoprazole (PROTONIX) IV  40 mg Intravenous Q24H   polyethylene glycol-electrolytes  4,000 mL Oral Once   vitamin B-12  500 mcg Oral Daily   Continuous Infusions:   LOS: 0 days   Time spent: 35 minutes   Darliss Cheney, MD [facility]  04/30/2021, 10:03 AM   Please page via Shea [facility] and do not message via secure chat for anything urgent. Secure chat can be used for anything non urgent.  How to contact the [facility] Attending or Consulting provider [place] or covering provider during after hours [place], for this patient?  Check the care team in [facility] and look for a) attending/consulting [facility] provider listed and b) the [facility] team listed. Page or secure chat [phone]. Log into [email or web address] and use [place]'s universal password to access. If you do not have the password, please contact the hospital operator. Locate the [facility] provider you are looking for under [facility] and page to a number that you can be directly reached. If you still have difficulty reaching the provider, please page the [facility] (Director on Call) for the Hospitalists listed on amion for assistance.

## 2021-05-01 ENCOUNTER — Inpatient Hospital Stay (HOSPITAL_COMMUNITY): Payer: Medicare Other | Admitting: Anesthesiology

## 2021-05-01 ENCOUNTER — Inpatient Hospital Stay (HOSPITAL_COMMUNITY): Payer: Medicare Other

## 2021-05-01 ENCOUNTER — Encounter (HOSPITAL_COMMUNITY): Admission: EM | Disposition: E | Payer: Self-pay | Source: Home / Self Care | Attending: Family Medicine

## 2021-05-01 ENCOUNTER — Encounter (HOSPITAL_COMMUNITY): Payer: Self-pay | Admitting: Family Medicine

## 2021-05-01 DIAGNOSIS — R627 Adult failure to thrive: Secondary | ICD-10-CM | POA: Diagnosis not present

## 2021-05-01 DIAGNOSIS — R778 Other specified abnormalities of plasma proteins: Secondary | ICD-10-CM | POA: Diagnosis not present

## 2021-05-01 DIAGNOSIS — I4891 Unspecified atrial fibrillation: Secondary | ICD-10-CM | POA: Diagnosis not present

## 2021-05-01 HISTORY — PX: COLONOSCOPY WITH PROPOFOL: SHX5780

## 2021-05-01 HISTORY — PX: SUBMUCOSAL TATTOO INJECTION: SHX6856

## 2021-05-01 HISTORY — PX: BIOPSY: SHX5522

## 2021-05-01 LAB — SURGICAL PCR SCREEN
MRSA, PCR: NEGATIVE
Staphylococcus aureus: NEGATIVE

## 2021-05-01 LAB — BASIC METABOLIC PANEL
Anion gap: 12 (ref 5–15)
BUN: 39 mg/dL — ABNORMAL HIGH (ref 8–23)
CO2: 22 mmol/L (ref 22–32)
Calcium: 9 mg/dL (ref 8.9–10.3)
Chloride: 109 mmol/L (ref 98–111)
Creatinine, Ser: 1.53 mg/dL — ABNORMAL HIGH (ref 0.44–1.00)
GFR, Estimated: 32 mL/min — ABNORMAL LOW (ref >60)
Glucose, Bld: 108 mg/dL — ABNORMAL HIGH (ref 70–99)
Potassium: 4.6 mmol/L (ref 3.5–5.1)
Sodium: 143 mmol/L (ref 135–145)

## 2021-05-01 LAB — CBC WITH DIFFERENTIAL/PLATELET
Abs Immature Granulocytes: 0.07 10*3/uL (ref 0.00–0.07)
Basophils Absolute: 0 10*3/uL (ref 0.0–0.1)
Basophils Relative: 0 %
Eosinophils Absolute: 0 10*3/uL (ref 0.0–0.5)
Eosinophils Relative: 0 %
HCT: 53.7 % — ABNORMAL HIGH (ref 36.0–46.0)
Hemoglobin: 16.7 g/dL — ABNORMAL HIGH (ref 12.0–15.0)
Immature Granulocytes: 1 %
Lymphocytes Relative: 4 %
Lymphs Abs: 0.6 10*3/uL — ABNORMAL LOW (ref 0.7–4.0)
MCH: 27.5 pg (ref 26.0–34.0)
MCHC: 31.1 g/dL (ref 30.0–36.0)
MCV: 88.3 fL (ref 80.0–100.0)
Monocytes Absolute: 1.5 10*3/uL — ABNORMAL HIGH (ref 0.1–1.0)
Monocytes Relative: 10 %
Neutro Abs: 12.7 10*3/uL — ABNORMAL HIGH (ref 1.7–7.7)
Neutrophils Relative %: 85 %
Platelets: 324 10*3/uL (ref 150–400)
RBC: 6.08 MIL/uL — ABNORMAL HIGH (ref 3.87–5.11)
RDW: 14.6 % (ref 11.5–15.5)
WBC: 14.9 10*3/uL — ABNORMAL HIGH (ref 4.0–10.5)
nRBC: 0 % (ref 0.0–0.2)

## 2021-05-01 LAB — ECHOCARDIOGRAM COMPLETE
Height: 63 in
Weight: 1985.9 oz

## 2021-05-01 LAB — CEA: CEA: 7.7 ng/mL — ABNORMAL HIGH (ref 0.0–4.7)

## 2021-05-01 SURGERY — COLONOSCOPY WITH PROPOFOL
Anesthesia: General

## 2021-05-01 MED ORDER — EPHEDRINE SULFATE-NACL 50-0.9 MG/10ML-% IV SOSY
PREFILLED_SYRINGE | INTRAVENOUS | Status: DC | PRN
  Administered 2021-05-01: 5 mg via INTRAVENOUS

## 2021-05-01 MED ORDER — ENSURE ENLIVE PO LIQD
237.0000 mL | Freq: Two times a day (BID) | ORAL | Status: DC
  Administered 2021-05-01 – 2021-05-06 (×6): 237 mL via ORAL
  Filled 2021-05-01: qty 237

## 2021-05-01 MED ORDER — PHENYLEPHRINE HCL-NACL 20-0.9 MG/250ML-% IV SOLN
INTRAVENOUS | Status: DC | PRN
  Administered 2021-05-01: 80 ug/min via INTRAVENOUS

## 2021-05-01 MED ORDER — PHENYLEPHRINE 40 MCG/ML (10ML) SYRINGE FOR IV PUSH (FOR BLOOD PRESSURE SUPPORT)
PREFILLED_SYRINGE | INTRAVENOUS | Status: DC | PRN
  Administered 2021-05-01: 80 ug via INTRAVENOUS
  Administered 2021-05-01 (×2): 40 ug via INTRAVENOUS

## 2021-05-01 MED ORDER — LACTATED RINGERS IV SOLN: INTRAVENOUS | Status: DC | PRN

## 2021-05-01 MED ORDER — LIDOCAINE 2% (20 MG/ML) 5 ML SYRINGE
INTRAMUSCULAR | Status: DC | PRN
  Administered 2021-05-01: 50 mg via INTRAVENOUS

## 2021-05-01 MED ORDER — LACTATED RINGERS IV SOLN: INTRAVENOUS | Status: DC

## 2021-05-01 MED ORDER — SUCCINYLCHOLINE CHLORIDE 200 MG/10ML IV SOSY
PREFILLED_SYRINGE | INTRAVENOUS | Status: DC | PRN
  Administered 2021-05-01: 60 mg via INTRAVENOUS

## 2021-05-01 MED ORDER — SPOT INK MARKER SYRINGE KIT
PACK | SUBMUCOSAL | Status: AC
  Filled 2021-05-01: qty 5

## 2021-05-01 MED ORDER — PROPOFOL 10 MG/ML IV BOLUS
INTRAVENOUS | Status: DC | PRN
  Administered 2021-05-01: 70 mg via INTRAVENOUS

## 2021-05-01 MED ORDER — ONDANSETRON HCL 4 MG/2ML IJ SOLN
INTRAMUSCULAR | Status: DC | PRN
  Administered 2021-05-01: 4 mg via INTRAVENOUS

## 2021-05-01 MED ORDER — PANTOPRAZOLE SODIUM 40 MG PO TBEC
40.0000 mg | DELAYED_RELEASE_TABLET | Freq: Every day | ORAL | Status: DC
  Administered 2021-05-01 – 2021-05-04 (×4): 40 mg via ORAL
  Filled 2021-05-01 (×3): qty 1

## 2021-05-01 MED ORDER — LACTATED RINGERS IV SOLN
INTRAVENOUS | Status: AC | PRN
  Administered 2021-05-01: 1000 mL via INTRAVENOUS

## 2021-05-01 MED ORDER — DEXAMETHASONE SODIUM PHOSPHATE 10 MG/ML IJ SOLN
INTRAMUSCULAR | Status: DC | PRN
  Administered 2021-05-01: 8 mg via INTRAVENOUS

## 2021-05-01 SURGICAL SUPPLY — 22 items

## 2021-05-01 NOTE — Op Note (Signed)
[facility] Patient Name: Hannah Stein Procedure Date : 04/13/2021 MRN: [id number] Attending MD: Otis Brace , MD Date of Birth: October 01, 1928 CSN: [id number] Age: 87 Admit Type: Inpatient Procedure:                Colonoscopy Indications:              Abnormal CT of the GI tract Providers:                Otis Brace, MD, Grace Isaac, RN, Luan Moore, Technician, Reece Agar, CRNA Referring MD:              Medicines:                Sedation Administered by an Anesthesia Professional Complications:            No immediate complications. Estimated Blood Loss:     Estimated blood loss was minimal. Procedure:                Pre-Anesthesia Assessment:                           - Prior to the procedure, a History and Physical                            was performed, and patient medications and                            allergies were reviewed. The patient's tolerance of                            previous anesthesia was also reviewed. The risks                            and benefits of the procedure and the sedation                            options and risks were discussed with the patient.                            All questions were answered, and informed consent                            was obtained. Prior Anticoagulants: The patient has                            taken Xarelto (rivaroxaban), last dose was 2 days                            prior to procedure. ASA Grade Assessment: III - A                            patient with severe systemic disease. After  reviewing the risks and benefits, the patient was                            deemed in satisfactory condition to undergo the                            procedure.                           After obtaining informed consent, the colonoscope                            was passed under direct vision. Throughout the                             procedure, the patient's blood pressure, pulse, and                            oxygen saturations were monitored continuously. The                            [id number]-HQ190L ([id number] Olympus colonoscope was                            introduced through the anus with the intention of                            advancing to the cecum. The scope was advanced to                            the sigmoid colon before the procedure was aborted.                            Medications were given. The colonoscopy was                            extremely difficult due to a partially obstructing                            mass. The patient tolerated the procedure well. The                            quality of the bowel preparation was poor. Scope In: 9:49:10 AM Scope Out: 10:04:36 AM Total Procedure Duration: 0 hours 15 minutes 26 seconds  Findings:      Hemorrhoids were found on perianal exam.      A frond-like/villous, infiltrative and ulcerated partially obstructing       large mass was found in the recto-sigmoid colon at 20 cm. The mass was       circumferential. Biopsies were taken with a cold forceps for histology.       Area was tattooed with an injection of 1 mL of Spot (carbon black). I       was not able to traverse this area with pediatric colonoscopy. Scope was  removed and upper endoscope was used but again I was not able to advance       scope beyond this area.      Internal hemorrhoids were found during retroflexion. The hemorrhoids       were large. Impression:               - Preparation of the colon was poor.                           - Hemorrhoids found on perianal exam.                           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 Biopsied. Tattooed.                           - Internal hemorrhoids. Recommendation:           - Return patient to hospital ward for ongoing care.                           - Full  liquid diet.                           - Continue present medications.                           - Await pathology results.                           - Refer to an oncologist at appointment to be                            scheduled. Procedure Code(s):        --- Professional ---                           [phone], 52, Colonoscopy, flexible; with directed                            submucosal injection(s), any substance                           35701, 70, Colonoscopy, flexible; with biopsy,                            single or multiple Diagnosis Code(s):        --- Professional ---                           K64.8, Other hemorrhoids                           D49.0, Neoplasm of unspecified behavior of  digestive system                           K56.690, Other partial intestinal obstruction                           R93.3, Abnormal findings on diagnostic imaging of                            other parts of digestive tract CPT copyright 2019 American Medical Association. All rights reserved. The codes documented in this report are preliminary and upon coder review may  be revised to meet current compliance requirements. Otis Brace, MD Otis Brace, MD 04/09/2021 10:17:48 AM Number of Addenda: 0

## 2021-05-01 NOTE — Evaluation (Signed)
Occupational Therapy Evaluation Patient Details Name: Hannah Stein MRN: [id number] DOB: 1928/12/02 Today's Date: 04/25/2021   History of Present Illness Pt is a 87 y/o female admitted 11/23 with abdominal pain, N/V and poor PO intake with weight loss.  CT pelvis showing a signoid colon mass measuring 8.2 x 3.4 cm.  PMHx: PAF, COPD   Clinical Impression   Pt was living alone independently prior to admission. Presents with significant weakness, impaired standing balance and decreased activity tolerance. She requires up to min assist for mobility and ADL and increased time. Daughter present in room and stating she cannot provide care for pt at home and pt will need SNF. Will follow acutely.      Recommendations for follow up therapy are one component of a multi-disciplinary discharge planning process, led by the attending physician.  Recommendations may be updated based on patient status, additional functional criteria and insurance authorization.   Follow Up Recommendations  Skilled nursing-short term rehab (<3 hours/day)    Assistance Recommended at Discharge Frequent or constant Supervision/Assistance  Functional Status Assessment  Patient has had a recent decline in their functional status and/or demonstrates limited ability to make significant improvements in function in a reasonable and predictable amount of time  Equipment Recommendations  BSC/3in1    Recommendations for Other Services       Precautions / Restrictions Precautions Precautions: Fall      Mobility Bed Mobility Overal bed mobility: Needs Assistance Bed Mobility: Supine to Sit;Sit to Supine     Supine to sit: Min assist Sit to supine: Min assist   General bed mobility comments: assist to raise trunk and for LEs back into bed    Transfers Overall transfer level: Needs assistance Equipment used: Rolling walker (2 wheels) Transfers: Sit to/from Stand Sit to Stand: Min assist           General  transfer comment: cues for hand placement, min to rise and steady      Balance Overall balance assessment: Needs assistance Sitting-balance support: No upper extremity supported Sitting balance-Leahy Scale: Fair     Standing balance support: During functional activity Standing balance-Leahy Scale: Fair Standing balance comment: assist for dynamic balance, fair static                           ADL either performed or assessed with clinical judgement   ADL Overall ADL's : Needs assistance/impaired Eating/Feeding: Independent;Bed level   Grooming: Wash/dry hands;Standing;Min guard   Upper Body Bathing: Minimal assistance;Sitting   Lower Body Bathing: Minimal assistance;Sit to/from stand   Upper Body Dressing : Minimal assistance;Sitting   Lower Body Dressing: Minimal assistance;Sit to/from stand   Toilet Transfer: Minimal assistance;Ambulation;Rolling walker (2 wheels)   Toileting- Clothing Manipulation and Hygiene: Minimal assistance;Sit to/from stand       Functional mobility during ADLs: Minimal assistance;Rolling walker (2 wheels) General ADL Comments: poor activity tolerance     Vision Baseline Vision/History: 0 No visual deficits Ability to See in Adequate Light: 0 Adequate       Perception     Praxis      Pertinent Vitals/Pain Pain Assessment: No/denies pain     Hand Dominance Right   Extremity/Trunk Assessment Upper Extremity Assessment Upper Extremity Assessment: Generalized weakness   Lower Extremity Assessment Lower Extremity Assessment: Defer to PT evaluation   Cervical / Trunk Assessment Cervical / Trunk Assessment: Normal   Communication Communication Communication: No difficulties   Cognition Arousal/Alertness: Awake/alert Behavior  During Therapy: WFL for tasks assessed/performed Overall Cognitive Status: Within Functional Limits for tasks assessed                                       General Comments        Exercises     Shoulder Instructions      Home Living Family/patient expects to be discharged to:: Private residence Living Arrangements: Alone Available Help at Discharge: Family;Available PRN/intermittently Type of Home: House Home Access: Level entry     Home Layout: One level     Bathroom Shower/Tub: Teacher, early years/pre: Standard     Home Equipment: Conservation officer, nature (2 wheels)          Prior Functioning/Environment Prior Level of Function : Independent/Modified Independent                        OT Problem List: Decreased strength;Decreased activity tolerance;Impaired balance (sitting and/or standing);Decreased knowledge of use of DME or AE      OT Treatment/Interventions: Self-care/ADL training;Energy conservation;DME and/or AE instruction;Therapeutic activities;Patient/family education;Balance training    OT Goals(Current goals can be found in the care plan section) Acute Rehab OT Goals OT Goal Formulation: With patient/family Time For Goal Achievement: 05/15/21 Potential to Achieve Goals: Good ADL Goals Pt Will Perform Grooming: with supervision;standing Pt Will Perform Lower Body Bathing: with supervision;sit to/from stand Pt Will Perform Lower Body Dressing: with supervision;sit to/from stand Pt Will Transfer to Toilet: with supervision;ambulating;bedside commode Pt Will Perform Toileting - Clothing Manipulation and hygiene: with supervision;sit to/from stand Additional ADL Goal #1: Pt will utilize energy conservation strategies during ADL and mobility.  OT Frequency: Min 2X/week   Barriers to D/C: Decreased caregiver support          Co-evaluation              AM-PAC OT "6 Clicks" Daily Activity     Outcome Measure Help from another person eating meals?: None Help from another person taking care of personal grooming?: A Little Help from another person toileting, which includes using toliet, bedpan, or urinal?: A  Little Help from another person bathing (including washing, rinsing, drying)?: A Little Help from another person to put on and taking off regular upper body clothing?: A Little Help from another person to put on and taking off regular lower body clothing?: A Little 6 Click Score: 19   End of Session Equipment Utilized During Treatment: Rolling walker (2 wheels);Gait belt  Activity Tolerance: Patient limited by fatigue Patient left: in bed;with call bell/phone within reach;with bed alarm set;with family/visitor present  OT Visit Diagnosis: Unsteadiness on feet (R26.81);Other abnormalities of gait and mobility (R26.89);Muscle weakness (generalized) (M62.81);Other (comment) (decreased activity tolerance)                Time: 1357-1420 OT Time Calculation (min): 23 min Charges:  OT General Charges $OT Visit: 1 Visit OT Evaluation $OT Eval Moderate Complexity: 1 Mod OT Treatments $Self Care/Home Management : 8-22 mins  Nestor Lewandowsky, OTR/L Acute Rehabilitation Services Pager: [phone] Office: [phone]   Malka So 04/30/2021, 3:22 PM

## 2021-05-01 NOTE — Progress Notes (Addendum)
Pt  refused to drink all the  polyethylene Glycol-electrolyte solution as ordered. Pt stated it made her feel sick. Pt educated and encouraged to drink.Pt stated "nothing would make me drink".

## 2021-05-01 NOTE — Progress Notes (Signed)
Initial Nutrition Assessment  DOCUMENTATION CODES:   Not applicable  INTERVENTION:  Provide Ensure Enlive po BID, each supplement provides 350 kcal and 20 grams of protein.  Monitor magnesium, potassium, and phosphorus BID for at least 3 days, MD to replete as needed, as pt is at risk for refeeding syndrome given reported poor po intake over 1 month.  NUTRITION DIAGNOSIS:   Inadequate oral intake related to nausea, vomiting as evidenced by per patient/family report.  GOAL:   Patient will meet greater than or equal to 90% of their needs  MONITOR:   PO intake, Supplement acceptance, Diet advancement, Labs, Weight trends, Skin, I & O's  REASON FOR ASSESSMENT:   Consult Assessment of nutrition requirement/status, Poor PO  ASSESSMENT:   87 y.o. female with medical history significant of PAF on xarelto, COPD, who presented with abdominal pain, N/V and poor PO intake over the past 1 month with weight loss. CT abdomen pelvis shows Abnormal circumferential wall thickening in the sigmoid colon possible mass measuring 8.2 x 3.4 cm. Possibility of malignant neoplastic process not excluded. 2.9 cm infrarenal abdominal aortic aneurysm.  Cirrhosis of liver with large ascites. Underwent paracentesis with removal of 4 L of fluid 11/24.  Pt unavailable during attempted time of visit. Pt s/p colonoscopy today with partially obstructing sigmoid mass, biopsy taken. Diet has just been advanced to a full liquid diet. RD to order nutritional supplements to aid in caloric and protein needs. Monitor magnesium, potassium, and phosphorus BID for at least 3 days, MD to replete as needed, as pt is at risk for refeeding syndrome given reported poor po intake over 1 month. Unable to complete Nutrition-Focused physical exam at this time.   Labs and medications reviewed.   Diet Order:   Diet Order             Diet full liquid Room service appropriate? Yes; Fluid consistency: Thin  Diet effective now                    EDUCATION NEEDS:   Not appropriate for education at this time  Skin:  Skin Assessment: Skin Integrity Issues: Skin Integrity Issues:: Stage II Stage II: L elbow  Last BM:  11/23  Height:   Ht Readings from Last 1 Encounters:  04/30/2021 5\' 3"  (1.6 m)    Weight:   Wt Readings from Last 1 Encounters:  04/30/2021 56.3 kg   BMI:  Body mass index is 21.99 kg/m.  Estimated Nutritional Needs:   Kcal:  1600-1800  Protein:  80-90 grams  Fluid:  >/= 1.6 L/day  Corrin Parker, MS, RD, LDN RD pager number/after hours weekend pager number on Am[employer].

## 2021-05-01 NOTE — Progress Notes (Signed)
PROGRESS NOTE    Hannah Stein  MHD:[id number] DOB: 11-Mar-1929 DOA: 04/12/2021 PCP: Kelton Pillar, MD   Brief Narrative:  Hannah Stein is a 87 y.o. female with medical history significant of PAF on xarelto, COPD,  who presented with abdominal pain, N/V and poor PO intake with weight loss. she also has epigastric pain since middle of October. Colonoscopy around 80-72 years of age. Report normal except for hemorrhoids.  Upon arrival to ED, she was fairly hemodynamically stable and afebrile. CT abdomen pelvis shows Abnormal circumferential wall thickening in the sigmoid colon possible mass measuring 8.2 x 3.4 cm.  Possibility of malignant neoplastic process not excluded.  2.9 cm infrarenal abdominal aortic aneurysm.  Cirrhosis of liver with large ascites.  She was admitted under hospitalist service.  Underwent paracentesis with removal of 4 L of fluid, cytology pending.  GI consulted.  Underwent colonoscopy 04/18/2021 was found to have partially obstructive sigmoid mass.  General surgery consulted.    Assessment & Plan:   Principal Problem:   Failure to thrive in adult Active Problems:   Leukocytosis   Paroxysmal atrial fibrillation (HCC)   Acute kidney injury superimposed on chronic kidney disease (HCC)   Ascites   possible sigmoid colon mass   Elevated troponin   Pressure injury of skin  Failure to thrive in adult/denies weakness: Likely secondary to malignancy.  Nutrition, PT, OT consulted.   sigmoid colon mass with possible cancer: -CT with concerning findings. Abnormal wall thickening in the sigmoid colon. Possible circumferential mass in sigmoid colon measuring approximately 8.2x 3.4cm.  S/p colonoscopy 04/23/2021 which shows partially obstructing sigmoid mass, biopsy taken.  GI recommends general surgery consult.  I have consulted general surgery.  I will also order CT chest for staging purposes.  She will eventually need PET scan as outpatient.  CEA is also pending.  Will  need oncology consult once CEA and pathology results are back.  This can be done as outpatient since pathology report may take several days to come back due to holidays, if general surgery does not recommend surgical procedure during this hospitalization.   Hepatic cirrhosis with large ascites: Status post diagnostic and therapeutic paracentesis with retrieval of 4 L of fluid on 04/30/2021.  Cytology pending.   AKI on CKD stage IIIa: Baseline creatinine seems to be around 1.1 GFR 45.  Presented with creatinine of 1.61 and GFR 30.  Improving.  This is likely secondary to dehydration.  BMP this morning is still pending.   Paroxysmal atrial fibrillation: Rate controlled.  Diltiazem stopped due to junctional rhythm.  Rates are controlled at this point in time.  Xarelto still on hold.  Will resume tomorrow if general surgery has no plans.   Elevated troponin: Very small elevation and flat with no ACS symptoms, indicates demand ischemia.  Will update echo.  She will need that for surgery if she were to have that surgery as well.    Leukocytosis: Nonspecific and resolved.  History hypertension: Blood pressure normal here and she is not on any medications either.   Infrarenal abdominal aortic aneurysm -2.9cm, follow outpatient   DVT prophylaxis: Place TED hose Start: 04/18/2021 1846   Code Status: Full Code  Family Communication: Daughter at bedside.  Status is: Inpatient  Remains inpatient appropriate because: Needs further work-up and plan for possible sigmoid mass.   Estimated body mass index is 21.99 kg/m as calculated from the following:   Height as of this encounter: 5\' 3"  (1.6 m).   Weight as  of this encounter: 56.3 kg.  Pressure Injury 04/08/2021 Elbow Posterior;Left Stage 2 -  Partial thickness loss of dermis presenting as a shallow open injury with a red, pink wound bed without slough. Red, open (Active)  04/24/2021 2300  Location: Elbow  Location Orientation: Posterior;Left  Staging:  Stage 2 -  Partial thickness loss of dermis presenting as a shallow open injury with a red, pink wound bed without slough.  Wound Description (Comments): Red, open  Present on Admission: Yes    Nutritional Assessment: Body mass index is 21.99 kg/m.Marland Kitchen Seen by dietician.  I agree with the assessment and plan as outlined below: Nutrition Status:   Skin Assessment: I have examined the patient's skin and I agree with the wound assessment as performed by the wound care RN as outlined below: Pressure Injury 04/13/2021 Elbow Posterior;Left Stage 2 -  Partial thickness loss of dermis presenting as a shallow open injury with a red, pink wound bed without slough. Red, open (Active)  04/23/2021 2300  Location: Elbow  Location Orientation: Posterior;Left  Staging: Stage 2 -  Partial thickness loss of dermis presenting as a shallow open injury with a red, pink wound bed without slough.  Wound Description (Comments): Red, open  Present on Admission: Yes    Consultants:  GI  Procedures:  None  Antimicrobials:  Anti-infectives (From admission, onward)    None          Subjective:  Patient seen and examined before she was going down for colonoscopy.  She had no complaints.  Daughter at the bedside.  Objective: Vitals:   05/05/2021 1035 05/06/2021 1045 04/28/2021 1049 04/28/2021 1054  BP: (!) 114/47 (!) 103/47 (!) 110/50 (!) 105/48  Pulse: 80 87 87 86  Resp: 20 17 (!) 23 (!) 23  Temp:      TempSrc:      SpO2: 97% 97% 97% 96%  Weight:      Height:        Intake/Output Summary (Last 24 hours) at 04/10/2021 1142 Last data filed at 04/14/2021 1022 Gross per 24 hour  Intake 860 ml  Output 150 ml  Net 710 ml    Filed Weights   04/09/2021 2215 04/23/2021 0325 04/25/2021 0827  Weight: 54.7 kg 56.3 kg 56.3 kg    Examination:   General exam: Appears calm and comfortable, appears cachectic with muscle loss. Respiratory system: Clear to auscultation. Respiratory effort normal. Cardiovascular  system: S1 & S2 heard, RRR. No JVD, murmurs, rubs, gallops or clicks. No pedal edema. Gastrointestinal system: Abdomen is nondistended, soft and nontender. No organomegaly or masses felt. Normal bowel sounds heard. Central nervous system: Alert and oriented. No focal neurological deficits. Extremities: Symmetric 5 x 5 power. Skin: No rashes, lesions or ulcers.  Psychiatry: Judgement and insight appear normal. Mood & affect appropriate.    Data Reviewed: I have personally reviewed following labs and imaging studies  CBC: Recent Labs  Lab 04/15/2021 1400 04/30/21 0120  WBC 11.6* 9.7  NEUTROABS 9.6*  --   HGB 15.0 13.1  HCT 47.8* 41.7  MCV 88.2 87.6  PLT 342 235    Basic Metabolic Panel: Recent Labs  Lab 04/24/2021 1400 04/30/21 0120  NA 143 142  K 4.0 3.8  CL 108 109  CO2 26 26  GLUCOSE 110* 94  BUN 45* 40*  CREATININE 1.61* 1.34*  CALCIUM 9.9 9.3    GFR: Estimated Creatinine Clearance: 23.1 mL/min (A) (by C-G formula based on SCr of 1.34 mg/dL (H)). Liver Function  Tests: Recent Labs  Lab 04/26/2021 1400 04/30/21 0120  AST 27 25  ALT 23 23  ALKPHOS 51 46  BILITOT 1.0 0.9  PROT 5.2* 4.5*  ALBUMIN 2.9* 2.4*    Recent Labs  Lab 05/06/2021 1400  LIPASE 32    No results for input(s): AMMONIA in the last 168 hours. Coagulation Profile: Recent Labs  Lab 04/26/2021 1722  INR 1.2    Cardiac Enzymes: No results for input(s): CKTOTAL, CKMB, CKMBINDEX, TROPONINI in the last 168 hours. BNP (last 3 results) No results for input(s): PROBNP in the last 8760 hours. HbA1C: No results for input(s): HGBA1C in the last 72 hours. CBG: No results for input(s): GLUCAP in the last 168 hours. Lipid Profile: No results for input(s): CHOL, HDL, LDLCALC, TRIG, CHOLHDL, LDLDIRECT in the last 72 hours. Thyroid Function Tests: No results for input(s): TSH, T4TOTAL, FREET4, T3FREE, THYROIDAB in the last 72 hours. Anemia Panel: No results for input(s): VITAMINB12, FOLATE, FERRITIN,  TIBC, IRON, RETICCTPCT in the last 72 hours. Sepsis Labs: No results for input(s): PROCALCITON, LATICACIDVEN in the last 168 hours.  Recent Results (from the past 240 hour(s))  Resp Panel by RT-PCR (Flu A&B, Covid) Nasopharyngeal Swab     Status: None   Collection Time: 04/21/2021  2:55 PM   Specimen: Nasopharyngeal Swab; Nasopharyngeal(NP) swabs in vial transport medium  Result Value Ref Range Status   SARS Coronavirus 2 by RT PCR NEGATIVE NEGATIVE Final    Comment: (NOTE) SARS-CoV-2 target nucleic acids are NOT DETECTED.  The SARS-CoV-2 RNA is generally detectable in upper respiratory specimens during the acute phase of infection. The lowest concentration of SARS-CoV-2 viral copies this assay can detect is 138 copies/mL. A negative result does not preclude SARS-Cov-2 infection and should not be used as the sole basis for treatment or other patient management decisions. A negative result may occur with  improper specimen collection/handling, submission of specimen other than nasopharyngeal swab, presence of viral mutation(s) within the areas targeted by this assay, and inadequate number of viral copies(<138 copies/mL). A negative result must be combined with clinical observations, patient history, and epidemiological information. The expected result is Negative.  Fact Sheet for Patients:  EntrepreneurPulse.com.au  Fact Sheet for Healthcare Providers:  IncredibleEmployment.be  This test is no t yet approved or cleared by the [place] FDA and  has been authorized for detection and/or diagnosis of SARS-CoV-2 by FDA under an Emergency Use Authorization (EUA). This EUA will remain  in effect (meaning this test can be used) for the duration of the COVID-19 declaration under Section 564(b)(1) of the Act, 21 U.S.C.section 360bbb-3(b)(1), unless the authorization is terminated  or revoked sooner.       Influenza A by PCR NEGATIVE NEGATIVE Final    Influenza B by PCR NEGATIVE NEGATIVE Final    Comment: (NOTE) The Xpert Xpress SARS-CoV-2/FLU/RSV plus assay is intended as an aid in the diagnosis of influenza from Nasopharyngeal swab specimens and should not be used as a sole basis for treatment. Nasal washings and aspirates are unacceptable for Xpert Xpress SARS-CoV-2/FLU/RSV testing.  Fact Sheet for Patients: EntrepreneurPulse.com.au  Fact Sheet for Healthcare Providers: IncredibleEmployment.be  This test is not yet approved or cleared by the [place] FDA and has been authorized for detection and/or diagnosis of SARS-CoV-2 by FDA under an Emergency Use Authorization (EUA). This EUA will remain in effect (meaning this test can be used) for the duration of the COVID-19 declaration under Section 564(b)(1) of the Act, 21 U.S.C. section 360bbb-3(b)(1),  unless the authorization is terminated or revoked.  Performed at [facility], [place] [address] [place], [place] [address]   Aerobic/Anaerobic Culture w Gram Stain (surgical/deep wound)     Status: None (Preliminary result)   Collection Time: 04/30/21 11:23 AM   Specimen: PATH Cytology Peritoneal fluid  Result Value Ref Range Status   Specimen Description PERITONEAL FLUID  Final   Special Requests NONE  Final   Gram Stain   Final    RARE WBC PRESENT,BOTH PMN AND MONONUCLEAR NO ORGANISMS SEEN    Culture   Final    NO GROWTH < 24 HOURS Performed at [facility], [place] [address], [place], [place] [address]    Report Status PENDING  Incomplete  Surgical PCR screen     Status: None   Collection Time: 04/30/21  8:54 PM   Specimen: Nasal Mucosa; Nasal Swab  Result Value Ref Range Status   MRSA, PCR NEGATIVE NEGATIVE Final   Staphylococcus aureus NEGATIVE NEGATIVE Final    Comment: (NOTE) The Xpert SA Assay (FDA approved for NASAL specimens in patients 49 years of age and older), is one component of a  comprehensive surveillance program. It is not intended to diagnose infection nor to guide or monitor treatment. Performed at [facility], [place] [address], [place], [place] [address]        Radiology Studies: CT ABDOMEN PELVIS W CONTRAST  Result Date: 04/23/2021 CLINICAL DATA:  Abdominal pain EXAM: CT ABDOMEN AND PELVIS WITH CONTRAST TECHNIQUE: Multidetector CT imaging of the abdomen and pelvis was performed using the standard protocol following bolus administration of intravenous contrast. CONTRAST:  3mL OMNIPAQUE IOHEXOL 300 MG/ML  SOLN COMPARISON:  None. FINDINGS: Lower chest: Centrilobular and panlobular emphysema is seen. There is small pericardial effusion. Small bilateral pleural effusions are seen, more so on the right side. There is fluid in the lumen of lower thoracic esophagus suggesting gastroesophageal reflux. Hepatobiliary: Liver measures 10.1 cm in length. There is nodularity in the liver surface suggesting cirrhosis. There is no dilation of bile ducts. There is mild wall thickening in gallbladder which may be due to chronic cholecystitis or related to ascites. Pancreas: No focal abnormality is seen Spleen: Spleen measures 10.2 cm in maximum diameter. No focal abnormality is seen. Adrenals/Urinary Tract: Adrenals are unremarkable. There is no hydronephrosis. There is contrast in the pelvocaliceal systems in the kidneys limiting evaluation for small nonobstructing stones. There is no hydronephrosis. There is 3.7 cm fluid density structure in the midportion of left kidney suggesting renal cyst. Stomach/Bowel: Small hiatal hernia is seen. Stomach is not distended. Small bowel loops are not dilated. Appendix is not distinctly seen. There is no focal pericecal inflammation. Scattered diverticula are seen in the colon without definite signs of focal diverticulitis. There is abnormal wall thickening in the sigmoid colon. There is possible circumferential mass in the sigmoid colon  rmeasuring approximately 8.2 x 3.4 cm. Vascular/Lymphatic: Extensive atherosclerotic plaques and calcifications are seen in the abdominal aorta and its major branches. There is 2.9 cm infrarenal aortic aneurysm. There is abnormal thickening of omentum. Reproductive: Unremarkable Other: Large ascites is present.  There is no pneumoperitoneum. Musculoskeletal: Degenerative changes are noted in the lumbar spine more so at L3-L4 and L4-L5 levels. IMPRESSION: There is no evidence of intestinal obstruction or pneumoperitoneum. There is no hydronephrosis. There is abnormal circumferential wall thickening in the sigmoid colon. Possibility of a malignant neoplastic process is not excluded. Endoscopy as clinically warranted should be considered. 2.9  cm infrarenal abdominal aortic aneurysm. Diverticulosis of colon without signs of focal acute diverticulitis. Cirrhosis of liver. Large ascites. There is abnormal thickening of omentum which may be due to chronic inflammation or neoplastic process. Small hiatal hernia. Possible gastroesophageal reflux. Small pericardial effusion. Small bilateral pleural effusions. COPD. Other findings as described in the body of the report. Electronically Signed   By: Elmer Picker M.D.   On: 04/16/2021 16:41   US Paracentesis  Result Date: 04/30/2021 INDICATION: Abdominal distention. Ascites. Request for diagnostic and therapeutic paracentesis. EXAM: ULTRASOUND GUIDED RIGHT LOWER QUADRANT PARACENTESIS MEDICATIONS: 1% plain lidocaine, 5 mL COMPLICATIONS: None immediate. PROCEDURE: Informed written consent was obtained from the patient after a discussion of the risks, benefits and alternatives to treatment. A timeout was performed prior to the initiation of the procedure. Initial ultrasound scanning demonstrates a large amount of ascites within the right lower abdominal quadrant. The right lower abdomen was prepped and draped in the usual sterile fashion. 1% lidocaine was used for local  anesthesia. Following this, a 19 gauge, 7-cm, Yueh catheter was introduced. An ultrasound image was saved for documentation purposes. The paracentesis was performed. The catheter was removed and a dressing was applied. The patient tolerated the procedure well without immediate post procedural complication. FINDINGS: A total of approximately 4 L of clear, dark yellow fluid was removed. Samples were sent to the laboratory as requested by the clinical team. IMPRESSION: Successful ultrasound-guided paracentesis yielding 4 liters of peritoneal fluid. Read by: Ascencion Dike PA-C Electronically Signed   By: Miachel Roux M.D.   On: 04/30/2021 11:26   [place] ASCITES (ABDOMEN LIMITED)  Result Date: 05/05/2021 CLINICAL DATA:  Assess for ascites EXAM: LIMITED ABDOMEN ULTRASOUND FOR ASCITES TECHNIQUE: Limited ultrasound survey for ascites was performed in all four abdominal quadrants. COMPARISON:  CT 04/11/2021 FINDINGS: Ultrasound evaluation of the 4 quadrants is performed. Large volume of ascites within the abdomen, greatest in the upper quadrants. Incidental note made of a left renal cyst. IMPRESSION: Large volume of ascites. Electronically Signed   By: Donavan Foil M.D.   On: 04/10/2021 19:47    Scheduled Meds:  cholecalciferol  2,000 Units Oral BID   mupirocin ointment  1 application Nasal BID   pantoprazole (PROTONIX) IV  40 mg Intravenous Q24H   vitamin B-12  500 mcg Oral Daily   Continuous Infusions:   LOS: 1 day   Time spent: 30 minutes   Darliss Cheney, MD [facility]  04/28/2021, 11:42 AM  Please page via She[facility] and do not message via secure chat for anything urgent. Secure chat can be used for anything non urgent.  How to contact the [facility] Attending or Consulting provider [place] or covering provider during after hours [place], for this patient?  Check the care team in [facility] and look for a) attending/consulting [facility] provider listed and b) the [facility] team listed. Page or secure chat [phone]. Log into  [email or web address] and use 's universal password to access. If you do not have the password, please contact the hospital operator. Locate the [facility] provider you are looking for under [facility] and page to a number that you can be directly reached. If you still have difficulty reaching the provider, please page the [facility] (Director on Call) for the Hospitalists listed on amion for assistance.

## 2021-05-01 NOTE — Progress Notes (Signed)
  Echocardiogram 2D Echocardiogram has been performed.  Stefany, Starace F 04/21/2021, 12:24 PM

## 2021-05-01 NOTE — Progress Notes (Signed)
PHARMACIST - PHYSICIAN COMMUNICATION  DR:   Doristine Bosworth  CONCERNING: IV to Oral Route Change Policy  RECOMMENDATION: This patient is receiving Protonix by the intravenous route.  Based on criteria approved by the Pharmacy and Therapeutics Committee, the intravenous medication(s) is/are being converted to the equivalent oral dose form(s).   DESCRIPTION: These criteria include: The patient is eating (either orally or via tube) and/or has been taking other orally administered medications for a least 24 hours The patient has no evidence of active gastrointestinal bleeding or impaired GI absorption (gastrectomy, short bowel, patient on TNA or NPO).  If you have questions about this conversion, please contact the Pharmacy Department  []   [phone] )  [facility] []   [phone] )  [facility] [x]   [phone] )  [facility] []   [phone] )  [facility] []   [phone] )  [facility]

## 2021-05-01 NOTE — Progress Notes (Incomplete)
Pt  drinking the  polyethylene Glycol-electrolyte solution. Pt stated it made her feel sick. Pt educated and encouraged to drink.

## 2021-05-01 NOTE — Transfer of Care (Signed)
Immediate Anesthesia Transfer of Care Note  Patient: Hannah Stein  Procedure(s) Performed: COLONOSCOPY WITH PROPOFOL BIOPSY SUBMUCOSAL TATTOO INJECTION  Patient Location: PACU  Anesthesia Type:General  Level of Consciousness: drowsy  Airway & Oxygen Therapy: Patient Spontanous Breathing  Post-op Assessment: Report given to RN and Post -op Vital signs reviewed and stable  Post vital signs: Reviewed and stable  Last Vitals:  Vitals Value Taken Time  BP 119/52 05/06/2021 1032  Temp    Pulse 81 04/15/2021 1032  Resp 22 04/08/2021 1032  SpO2 98 % 04/10/2021 1032  Vitals shown include unvalidated device data.  Last Pain:  Vitals:   04/08/2021 0827  TempSrc:   PainSc: 0-No pain         Complications: No notable events documented.

## 2021-05-01 NOTE — Progress Notes (Signed)
Physical Therapy Treatment Patient Details Name: Hannah Stein MRN: [id number] DOB: [birth year] Today's Date: 04/08/2021   History of Present Illness Pt is a 87 y/o female admitted 11/23 with abdominal pain, N/V and poor PO intake with weight loss.  CT pelvis showing a signoid colon mass measuring 8.2 x 3.4 cm.  PMHx: PAF, COPD    PT Comments    Pt making gradual progress; however, fatigues very easily, requiring min A and cues for safety. Noted in OT note that dtr reports that she will not be able to assist at d/c.  AM-PAC score of 17 indicates HH; however, pt lives alone, fatigues very easily, not ambulating household distances, and requiring assist at 87 yo - so would benefit from SNF.     Recommendations for follow up therapy are one component of a multi-disciplinary discharge planning process, led by the attending physician.  Recommendations may be updated based on patient status, additional functional criteria and insurance authorization.  Follow Up Recommendations  Skilled nursing-short term rehab (<3 hours/day)     Assistance Recommended at Discharge Intermittent Supervision/Assistance  Equipment Recommendations  Rolling walker (2 wheels)    Recommendations for Other Services       Precautions / Restrictions Precautions Precautions: Fall     Mobility  Bed Mobility Overal bed mobility: Needs Assistance Bed Mobility: Supine to Sit;Sit to Supine     Supine to sit: Min assist Sit to supine: Min assist   General bed mobility comments: assist to raise trunk and for LEs back into bed; cues for bed rail use to assist    Transfers Overall transfer level: Needs assistance Equipment used: Rolling walker (2 wheels) Transfers: Sit to/from Stand Sit to Stand: Min assist           General transfer comment: cues for hand placement, min to rise and steady; performed x 3    Ambulation/Gait Ambulation/Gait assistance: Min assist Gait Distance (Feet): 50 Feet (50' then  20') Assistive device: Rolling walker (2 wheels) Gait Pattern/deviations: Step-to pattern;Decreased stride length Gait velocity: decreased     General Gait Details: Min A to steady; cues for RW; fatigues easily ; 1 sitting rest break   Stairs             Wheelchair Mobility    Modified Rankin (Stroke Patients Only)       Balance Overall balance assessment: Needs assistance Sitting-balance support: No upper extremity supported Sitting balance-Leahy Scale: Good     Standing balance support: During functional activity;Bilateral upper extremity supported;Reliant on assistive device for balance Standing balance-Leahy Scale: Poor Standing balance comment: using RW                            Cognition Arousal/Alertness: Awake/alert Behavior During Therapy: WFL for tasks assessed/performed Overall Cognitive Status: Within Functional Limits for tasks assessed                                          Exercises      General Comments General comments (skin integrity, edema, etc.): VSS; limited by fatigue - declined further exercises.  Did educate on bed level exercises as albe      Pertinent Vitals/Pain Pain Assessment: Faces Faces Pain Scale: Hurts a little bit Pain Location: stomach/chest (indigestion like) Pain Descriptors / Indicators: Burning Pain Intervention(s): Limited activity within patient's tolerance;Monitored during  session    Home Living Family/patient expects to be discharged to:: Private residence Living Arrangements: Alone Available Help at Discharge: Family;Available PRN/intermittently Type of Home: House Home Access: Level entry       Home Layout: One level Home Equipment: Conservation officer, nature (2 wheels)      Prior Function            PT Goals (current goals can now be found in the care plan section) Progress towards PT goals: Progressing toward goals    Frequency    Min 2X/week      PT Plan Discharge  plan needs to be updated (noted in OT note - dtr unable to stay with pt)    Co-evaluation              AM-PAC PT "6 Clicks" Mobility   Outcome Measure  Help needed turning from your back to your side while in a flat bed without using bedrails?: A Little Help needed moving from lying on your back to sitting on the side of a flat bed without using bedrails?: A Little Help needed moving to and from a bed to a chair (including a wheelchair)?: A Little Help needed standing up from a chair using your arms (e.g., wheelchair or bedside chair)?: A Little Help needed to walk in hospital room?: A Little Help needed climbing 3-5 steps with a railing? : A Lot 6 Click Score: 17    End of Session Equipment Utilized During Treatment: Gait belt Activity Tolerance: Patient tolerated treatment well Patient left: in bed;with call bell/phone within reach;with bed alarm set Nurse Communication: Mobility status PT Visit Diagnosis: Unsteadiness on feet (R26.81);Other abnormalities of gait and mobility (R26.89);Difficulty in walking, not elsewhere classified (R26.2);Muscle weakness (generalized) (M62.81)     Time: 1610-9604 PT Time Calculation (min) (ACUTE ONLY): 20 min  Charges:  $Gait Training: 8-22 mins                     Abran Richard, PT Acute Rehab Services Pager [phone] [facility] [place] 05/02/2021, 4:51 PM

## 2021-05-01 NOTE — Progress Notes (Signed)
[facility] Progress Note  Hannah Stein 87 y.o. 1928-09-01  CC: Abdominal pain, abnormal CT scan   Subjective: Patient seen and examined at bedside.  Was only able to drink about half of the prep.  Denies seeing any blood in the stool during prep.  ROS : Positive for nausea.  Negative for fever.   Objective: Vital signs in last 24 hours: Vitals:   04/28/2021 0325 05/06/2021 0827  BP: 127/66 (!) 145/65  Pulse: 85 92  Resp: 20 12  Temp: 97.7 F (36.5 C)   SpO2: 97% 95%    Physical Exam:  General : Elderly patient, not in acute distress Abdomen : Distended, soft, bowel sounds present.  No peritoneal signs Neuro : Alert and oriented x3 Psych : Somewhat anxious  Lab Results: Recent Labs    04/07/2021 1400 04/30/21 0120  NA 143 142  K 4.0 3.8  CL 108 109  CO2 26 26  GLUCOSE 110* 94  BUN 45* 40*  CREATININE 1.61* 1.34*  CALCIUM 9.9 9.3   Recent Labs    04/11/2021 1400 04/30/21 0120  AST 27 25  ALT 23 23  ALKPHOS 51 46  BILITOT 1.0 0.9  PROT 5.2* 4.5*  ALBUMIN 2.9* 2.4*   Recent Labs    04/20/2021 1400 04/30/21 0120  WBC 11.6* 9.7  NEUTROABS 9.6*  --   HGB 15.0 13.1  HCT 47.8* 41.7  MCV 88.2 87.6  PLT 342 280   Recent Labs    04/08/2021 1722  LABPROT 15.3*  INR 1.2      Assessment/Plan: Abnormal CT scan concerning for sigmoid colon mass.  There is also concern for thickening of the omentum concerning for malignancy or secondary to chronic inflammation. -Cirrhosis of the liver with large volume ascites.  MELD 11.  S/p paracentesis with removal of 4 L of fluid yesterday.  Initial fluid analysis negative for SBP. -History of atrial fibrillation.  Was on Xarelto.  Last dose 3 days ago. -Decreased appetite and weight loss.  Concerning for malignancy.   Recommendations -------------------------- -Proceed with colonoscopy today -Follow CEA -Follow acetic fluid cytology -GI will follow   Otis Brace MD, FACP 04/27/2021, 8:42  AM  Contact #  [phone]

## 2021-05-01 NOTE — Anesthesia Preprocedure Evaluation (Addendum)
Anesthesia Evaluation  Patient identified by MRN, date of birth, ID band Patient awake    Reviewed: Allergy & Precautions, NPO status , Patient's Chart, lab work & pertinent test results  Airway Mallampati: II  TM Distance: >3 FB Neck ROM: Full    Dental  (+) Missing, Dental Advisory Given   Pulmonary neg pulmonary ROS, former smoker,    Pulmonary exam normal breath sounds clear to auscultation       Cardiovascular negative cardio ROS Normal cardiovascular exam Rhythm:Regular Rate:Normal     Neuro/Psych negative neurological ROS     GI/Hepatic Neg liver ROS, GERD  Poorly Controlled,  Endo/Other  negative endocrine ROS  Renal/GU Renal disease     Musculoskeletal negative musculoskeletal ROS (+)   Abdominal   Peds  Hematology negative hematology ROS (+)   Anesthesia Other Findings   Reproductive/Obstetrics                            Anesthesia Physical Anesthesia Plan  ASA: 3  Anesthesia Plan: General   Post-op Pain Management:    Induction: Intravenous, Rapid sequence and Cricoid pressure planned  PONV Risk Score and Plan: 2 and Propofol infusion and Treatment may vary due to age or medical condition  Airway Management Planned: Oral ETT  Additional Equipment:   Intra-op Plan:   Post-operative Plan: Extubation in OR  Informed Consent: I have reviewed the patients History and Physical, chart, labs and discussed the procedure including the risks, benefits and alternatives for the proposed anesthesia with the patient or authorized representative who has indicated his/her understanding and acceptance.     Dental advisory given  Plan Discussed with: CRNA  Anesthesia Plan Comments:        Anesthesia Quick Evaluation

## 2021-05-01 NOTE — Anesthesia Procedure Notes (Signed)
Procedure Name: Intubation Date/Time: 04/07/2021 9:42 AM Performed by: Reece Agar, CRNA Pre-anesthesia Checklist: Patient identified, Emergency Drugs available, Suction available and Patient being monitored Patient Re-evaluated:Patient Re-evaluated prior to induction Oxygen Delivery Method: Circle System Utilized Preoxygenation: Pre-oxygenation with 100% oxygen Induction Type: IV induction, Rapid sequence and Cricoid Pressure applied Ventilation: Mask ventilation without difficulty Laryngoscope Size: Mac and 3 Grade View: Grade I Tube type: Oral Tube size: 7.0 mm Number of attempts: 1 Airway Equipment and Method: Stylet Placement Confirmation: ETT inserted through vocal cords under direct vision, positive ETCO2 and breath sounds checked- equal and bilateral Secured at: 21 cm Tube secured with: Tape Dental Injury: Teeth and Oropharynx as per pre-operative assessment

## 2021-05-01 NOTE — Brief Op Note (Signed)
04/30/2021 - 04/22/2021  10:20 AM  PATIENT:  Hannah Stein  87 y.o. female  PRE-OPERATIVE DIAGNOSIS:  Sigmoid colon mass  POST-OPERATIVE DIAGNOSIS:  Recto sigmoid mass.. biopsied and tattooed  PROCEDURE:  Procedure(s): COLONOSCOPY WITH PROPOFOL (N/A) BIOPSY SUBMUCOSAL TATTOO INJECTION  SURGEON:  Surgeon(s) and Role:    * Ruthellen Tippy, MD - Primary  Findings ---------- -Colonoscopy showed partially obstructing mass in the rectosigmoid colon at around 20 cm.  Not able to advance scope even with use of upper endoscope.  Biopsied.  Tattoo placed in a distal area  Recommendation ----------------------- -Start full liquid diet -Recommend oncology and surgery consult -If not a surgical candidate, I will discuss with Dr. Watt Climes regarding colonic stent placement. -Discussed with patient's daughter over the phone. -Follow biopsy as well as cytology from recent paracentesis -GI will see her back on Monday.  Call us back if needed during weekends.  Otis Brace MD, FACP 04/14/2021, 10:22 AM  Contact #  [phone]

## 2021-05-02 ENCOUNTER — Inpatient Hospital Stay (HOSPITAL_COMMUNITY): Payer: Medicare Other

## 2021-05-02 DIAGNOSIS — R627 Adult failure to thrive: Secondary | ICD-10-CM | POA: Diagnosis not present

## 2021-05-02 LAB — BASIC METABOLIC PANEL
Anion gap: 10 (ref 5–15)
BUN: 42 mg/dL — ABNORMAL HIGH (ref 8–23)
CO2: 23 mmol/L (ref 22–32)
Calcium: 9.3 mg/dL (ref 8.9–10.3)
Chloride: 105 mmol/L (ref 98–111)
Creatinine, Ser: 1.7 mg/dL — ABNORMAL HIGH (ref 0.44–1.00)
GFR, Estimated: 28 mL/min — ABNORMAL LOW (ref >60)
Glucose, Bld: 147 mg/dL — ABNORMAL HIGH (ref 70–99)
Potassium: 5.3 mmol/L — ABNORMAL HIGH (ref 3.5–5.1)
Sodium: 138 mmol/L (ref 135–145)

## 2021-05-02 LAB — CBC WITH DIFFERENTIAL/PLATELET
Abs Immature Granulocytes: 0.1 10*3/uL — ABNORMAL HIGH (ref 0.00–0.07)
Basophils Absolute: 0 10*3/uL (ref 0.0–0.1)
Basophils Relative: 0 %
Eosinophils Absolute: 0 10*3/uL (ref 0.0–0.5)
Eosinophils Relative: 0 %
HCT: 50.7 % — ABNORMAL HIGH (ref 36.0–46.0)
Hemoglobin: 16.3 g/dL — ABNORMAL HIGH (ref 12.0–15.0)
Immature Granulocytes: 1 %
Lymphocytes Relative: 4 %
Lymphs Abs: 0.6 10*3/uL — ABNORMAL LOW (ref 0.7–4.0)
MCH: 27.9 pg (ref 26.0–34.0)
MCHC: 32.1 g/dL (ref 30.0–36.0)
MCV: 86.7 fL (ref 80.0–100.0)
Monocytes Absolute: 1.7 10*3/uL — ABNORMAL HIGH (ref 0.1–1.0)
Monocytes Relative: 11 %
Neutro Abs: 13.3 10*3/uL — ABNORMAL HIGH (ref 1.7–7.7)
Neutrophils Relative %: 84 %
Platelets: 351 10*3/uL (ref 150–400)
RBC: 5.85 MIL/uL — ABNORMAL HIGH (ref 3.87–5.11)
RDW: 14.1 % (ref 11.5–15.5)
WBC: 15.7 10*3/uL — ABNORMAL HIGH (ref 4.0–10.5)
nRBC: 0 % (ref 0.0–0.2)

## 2021-05-02 MED ORDER — SODIUM CHLORIDE 0.9 % IV SOLN
INTRAVENOUS | Status: DC
Start: 2021-05-02 — End: 2021-05-07

## 2021-05-02 NOTE — Progress Notes (Signed)
Met with patient briefly earlier in the day.  Not much to say till path is back.  The surgery team will check back in with patient once it results.  Felicie Morn, MD

## 2021-05-02 NOTE — Progress Notes (Addendum)
PROGRESS NOTE    MARLENA BARBATO  ZOX:[id number] DOB: [birth year] DOA: 04/13/2021 PCP: Kelton Pillar, MD   Brief Narrative:  Hannah Stein is a 87 y.o. female with medical history significant of PAF on xarelto, COPD,  who presented with abdominal pain, N/V and poor PO intake with weight loss. she also has epigastric pain since middle of October. Colonoscopy around 20-10 years of age. Report normal except for hemorrhoids.  Upon arrival to ED, she was fairly hemodynamically stable and afebrile. CT abdomen pelvis shows Abnormal circumferential wall thickening in the sigmoid colon possible mass measuring 8.2 x 3.4 cm.  Possibility of malignant neoplastic process not excluded.  2.9 cm infrarenal abdominal aortic aneurysm.  Cirrhosis of liver with large ascites.  She was admitted under hospitalist service.  Underwent paracentesis with removal of 4 L of fluid, cytology pending.  GI consulted.  Underwent colonoscopy 04/25/2021 was found to have partially obstructive sigmoid mass.  General surgery consulted.    Assessment & Plan:   Principal Problem:   Failure to thrive in adult Active Problems:   Leukocytosis   Paroxysmal atrial fibrillation (HCC)   Acute kidney injury superimposed on chronic kidney disease (HCC)   Ascites   possible sigmoid colon mass   Elevated troponin   Pressure injury of skin  Failure to thrive in adult/denies weakness: Likely secondary to malignancy.  Nutrition, PT, OT consulted.   sigmoid colon mass with possible cancer: -CT with concerning findings. Abnormal wall thickening in the sigmoid colon. Possible circumferential mass in sigmoid colon measuring approximately 8.2x 3.4cm.  S/p colonoscopy 05/04/2021 which shows partially obstructing sigmoid mass, biopsy taken, pathology pending.  General surgery was consulted per GI recommendation.  Per general surgery, nothing much to say or do until pathology report is back.  Per GI note, they will consider possible colonic  stent if no surgical option.  Waiting for both general surgery and GI to come up with a final plan about this patient.  No role of oncology consultation until we have pathology report back.  CT chest, abdomen and pelvis did not show any mets.  But CT chest did show small pneumothorax on the right side.  Addendum 8:11 PM: Due to uncertainty and basically no clear plans from GI, I reached out to Dr. Trula Slade requesting to see this patient, he informed me that he is not on-call.  I reached out to Dr. Paulita Fujita, who is on-call for [facility].  I requested him to see patient as well earlier in the day.  Patient has not been seen yet.  I have informed him once again through secure chat that patient and family has some questions about what exactly the plan is for her obstructive mass and that we will appreciate him seeing the patient.  Small right-sided pneumothorax: Patient asymptomatic.  Will monitor.  We will repeat chest x-ray tomorrow or day after.   Hepatic cirrhosis with large ascites: Status post diagnostic and therapeutic paracentesis with retrieval of 4 L of fluid on 04/30/2021.  Cytology pending.   AKI on CKD stage IIIa: Baseline creatinine seems to be around 1.1 GFR 45.  Presented with creatinine of 1.61 and GFR 30.  Improving.  Creatinine slightly worsening.  Will start on normal saline and repeat labs in the morning.   Paroxysmal atrial fibrillation: Rate controlled.  Diltiazem stopped due to junctional rhythm.  Rates are controlled at this point in time.  Xarelto still on hold.  Continue to hold until plans are clarified from general  surgery and GI.   Elevated troponin: Very small elevation and flat with no ACS symptoms, indicates demand ischemia.  Will update echo.  She will need that for surgery if she were to have that surgery as well.    Leukocytosis: Nonspecific and resolved.  History hypertension: Blood pressure normal here and she is not on any medications either.  Generalized weakness: PT  OT recommended SNF.  TOC consulted.   Infrarenal abdominal aortic aneurysm -2.9cm, follow outpatient   DVT prophylaxis: Place TED hose Start: 04/19/2021 1846   Code Status: Full Code  Family Communication: Daughter at bedside.  Status is: Inpatient  Remains inpatient appropriate because: Needs further work-up and plan for possible sigmoid mass.   Estimated body mass index is 22.1 kg/m as calculated from the following:   Height as of this encounter: 5\' 3"  (1.6 m).   Weight as of this encounter: 56.6 kg.  Pressure Injury 05/06/2021 Elbow Posterior;Left Stage 2 -  Partial thickness loss of dermis presenting as a shallow open injury with a red, pink wound bed without slough. Red, open (Active)  04/17/2021 2300  Location: Elbow  Location Orientation: Posterior;Left  Staging: Stage 2 -  Partial thickness loss of dermis presenting as a shallow open injury with a red, pink wound bed without slough.  Wound Description (Comments): Red, open  Present on Admission: Yes    Nutritional Assessment: Body mass index is 22.1 kg/m.Marland Kitchen Seen by dietician.  I agree with the assessment and plan as outlined below: Nutrition Status: Nutrition Problem: Inadequate oral intake Etiology: nausea, vomiting Skin Assessment: I have examined the patient's skin and I agree with the wound assessment as performed by the wound care RN as outlined below: Pressure Injury 04/18/2021 Elbow Posterior;Left Stage 2 -  Partial thickness loss of dermis presenting as a shallow open injury with a red, pink wound bed without slough. Red, open (Active)  04/25/2021 2300  Location: Elbow  Location Orientation: Posterior;Left  Staging: Stage 2 -  Partial thickness loss of dermis presenting as a shallow open injury with a red, pink wound bed without slough.  Wound Description (Comments): Red, open  Present on Admission: Yes    Consultants:  GI General surgery  Procedures:  None  Antimicrobials:  Anti-infectives (From admission,  onward)    None          Subjective:  Patient seen and examined.  Daughter at the bedside.  Patient has no specific complaint.  No chest pain or shortness of breath.  Objective: Vitals:   05/05/2021 2315 05/02/21 0305 05/02/21 0800 05/02/21 1140  BP: 130/63 131/66 (!) 111/46 105/74  Pulse: 80 85 86 82  Resp: 18 19 17 19   Temp: 98 F (36.7 C) 98.6 F (37 C) 98.2 F (36.8 C) 98.2 F (36.8 C)  TempSrc: Oral Oral Axillary Oral  SpO2: 98% 98% 99% 98%  Weight:  56.6 kg    Height:        Intake/Output Summary (Last 24 hours) at 05/02/2021 1240 Last data filed at 04/27/2021 1400 Gross per 24 hour  Intake 120 ml  Output --  Net 120 ml    Filed Weights   04/20/2021 0325 04/24/2021 0827 05/02/21 0305  Weight: 56.3 kg 56.3 kg 56.6 kg    Examination:   General exam: Appears calm and comfortable, appears cachectic Respiratory system: Clear to auscultation. Respiratory effort normal. Cardiovascular system: S1 & S2 heard, RRR. No JVD, murmurs, rubs, gallops or clicks. No pedal edema. Gastrointestinal system: Abdomen is nondistended,  soft and nontender. No organomegaly or masses felt. Normal bowel sounds heard. Central nervous system: Alert and oriented. No focal neurological deficits. Extremities: Symmetric 5 x 5 power. Skin: No rashes, lesions or ulcers.  Psychiatry: Judgement and insight appear normal. Mood & affect appropriate.    Data Reviewed: I have personally reviewed following labs and imaging studies  CBC: Recent Labs  Lab 05/04/2021 1400 04/30/21 0120 04/12/2021 1104 05/02/21 0210  WBC 11.6* 9.7 14.9* 15.7*  NEUTROABS 9.6*  --  12.7* 13.3*  HGB 15.0 13.1 16.7* 16.3*  HCT 47.8* 41.7 53.7* 50.7*  MCV 88.2 87.6 88.3 86.7  PLT 342 280 324 956    Basic Metabolic Panel: Recent Labs  Lab 04/23/2021 1400 04/30/21 0120 05/04/2021 1104 05/02/21 0210  NA 143 142 143 138  K 4.0 3.8 4.6 5.3*  CL 108 109 109 105  CO2 26 26 22 23   GLUCOSE 110* 94 108* 147*  BUN 45*  40* 39* 42*  CREATININE 1.61* 1.34* 1.53* 1.70*  CALCIUM 9.9 9.3 9.0 9.3    GFR: Estimated Creatinine Clearance: 18.2 mL/min (A) (by C-G formula based on SCr of 1.7 mg/dL (H)). Liver Function Tests: Recent Labs  Lab 04/10/2021 1400 04/30/21 0120  AST 27 25  ALT 23 23  ALKPHOS 51 46  BILITOT 1.0 0.9  PROT 5.2* 4.5*  ALBUMIN 2.9* 2.4*    Recent Labs  Lab 04/24/2021 1400  LIPASE 32    No results for input(s): AMMONIA in the last 168 hours. Coagulation Profile: Recent Labs  Lab 04/22/2021 1722  INR 1.2    Cardiac Enzymes: No results for input(s): CKTOTAL, CKMB, CKMBINDEX, TROPONINI in the last 168 hours. BNP (last 3 results) No results for input(s): PROBNP in the last 8760 hours. HbA1C: No results for input(s): HGBA1C in the last 72 hours. CBG: No results for input(s): GLUCAP in the last 168 hours. Lipid Profile: No results for input(s): CHOL, HDL, LDLCALC, TRIG, CHOLHDL, LDLDIRECT in the last 72 hours. Thyroid Function Tests: No results for input(s): TSH, T4TOTAL, FREET4, T3FREE, THYROIDAB in the last 72 hours. Anemia Panel: No results for input(s): VITAMINB12, FOLATE, FERRITIN, TIBC, IRON, RETICCTPCT in the last 72 hours. Sepsis Labs: No results for input(s): PROCALCITON, LATICACIDVEN in the last 168 hours.  Recent Results (from the past 240 hour(s))  Resp Panel by RT-PCR (Flu A&B, Covid) Nasopharyngeal Swab     Status: None   Collection Time: 04/30/2021  2:55 PM   Specimen: Nasopharyngeal Swab; Nasopharyngeal(NP) swabs in vial transport medium  Result Value Ref Range Status   SARS Coronavirus 2 by RT PCR NEGATIVE NEGATIVE Final    Comment: (NOTE) SARS-CoV-2 target nucleic acids are NOT DETECTED.  The SARS-CoV-2 RNA is generally detectable in upper respiratory specimens during the acute phase of infection. The lowest concentration of SARS-CoV-2 viral copies this assay can detect is 138 copies/mL. A negative result does not preclude SARS-Cov-2 infection and should  not be used as the sole basis for treatment or other patient management decisions. A negative result may occur with  improper specimen collection/handling, submission of specimen other than nasopharyngeal swab, presence of viral mutation(s) within the areas targeted by this assay, and inadequate number of viral copies(<138 copies/mL). A negative result must be combined with clinical observations, patient history, and epidemiological information. The expected result is Negative.  Fact Sheet for Patients:  EntrepreneurPulse.com.au  Fact Sheet for Healthcare Providers:  IncredibleEmployment.be  This test is no t yet approved or cleared by the [place] and  has been authorized for detection and/or diagnosis of SARS-CoV-2 by FDA under an Emergency Use Authorization (EUA). This EUA will remain  in effect (meaning this test can be used) for the duration of the COVID-19 declaration under Section 564(b)(1) of the Act, 21 U.S.C.section 360bbb-3(b)(1), unless the authorization is terminated  or revoked sooner.       Influenza A by PCR NEGATIVE NEGATIVE Final   Influenza B by PCR NEGATIVE NEGATIVE Final    Comment: (NOTE) The Xpert Xpress SARS-CoV-2/FLU/RSV plus assay is intended as an aid in the diagnosis of influenza from Nasopharyngeal swab specimens and should not be used as a sole basis for treatment. Nasal washings and aspirates are unacceptable for Xpert Xpress SARS-CoV-2/FLU/RSV testing.  Fact Sheet for Patients: EntrepreneurPulse.com.au  Fact Sheet for Healthcare Providers: IncredibleEmployment.be  This test is not yet approved or cleared by the [place] FDA and has been authorized for detection and/or diagnosis of SARS-CoV-2 by FDA under an Emergency Use Authorization (EUA). This EUA will remain in effect (meaning this test can be used) for the duration of the COVID-19 declaration under  Section 564(b)(1) of the Act, 21 U.S.C. section 360bbb-3(b)(1), unless the authorization is terminated or revoked.  Performed at [facility], [place] [address] [place], [place] [address]   Aerobic/Anaerobic Culture w Gram Stain (surgical/deep wound)     Status: None (Preliminary result)   Collection Time: 04/30/21 11:23 AM   Specimen: PATH Cytology Peritoneal fluid  Result Value Ref Range Status   Specimen Description PERITONEAL FLUID  Final   Special Requests NONE  Final   Gram Stain   Final    RARE WBC PRESENT,BOTH PMN AND MONONUCLEAR NO ORGANISMS SEEN    Culture   Final    NO GROWTH 2 DAYS Performed at [facility], [place] [address] [place], [place] [address]    Report Status PENDING  Incomplete  Surgical PCR screen     Status: None   Collection Time: 04/30/21  8:54 PM   Specimen: Nasal Mucosa; Nasal Swab  Result Value Ref Range Status   MRSA, PCR NEGATIVE NEGATIVE Final   Staphylococcus aureus NEGATIVE NEGATIVE Final    Comment: (NOTE) The Xpert SA Assay (FDA approved for NASAL specimens in patients 44 years of age and older), is one component of a comprehensive surveillance program. It is not intended to diagnose infection nor to guide or monitor treatment. Performed at [facility], [place] [address] [place], [place] [address]        Radiology Studies: CT CHEST WO CONTRAST  Result Date: 05/02/2021 CLINICAL DATA:  Colorectal cancer, staging EXAM: CT CHEST WITHOUT CONTRAST TECHNIQUE: Multidetector CT imaging of the chest was performed following the standard protocol without IV contrast. COMPARISON:  CT abdomen 05/05/2021 FINDINGS: Cardiovascular: Heart size normal. Small pericardial effusion. Dilated central pulmonary arteries suggesting pulmonary hypertension. 3-vessel coronary calcifications. Atheromatous thoracic aorta without aneurysm. Mediastinum/Nodes: Small hiatal hernia. No definite mass or adenopathy, sensitivity decreased without IV contrast.  Heterogenous mildly enlarged right thyroid lobe; In the setting of significant comorbidities or limited life expectancy, no follow-up recommended (ref: J Am Coll Radiol. 2015 Feb;12(2): 143-50). Lungs/Pleura: Small pleural effusions right greater than left, without evidence of pleural nodularity or loculation. Small right pneumothorax estimated less than 10%, which was not evident on the prior study. Pulmonary emphysema. Pleural based calcifications and scarring in the posterior right lung apex. Bandlike scarring or atelectasis in the anteroinferior right middle lobe. Dependent atelectasis/consolidation posteriorly at the right  lung base. Upper Abdomen: Abdominal ascites. Musculoskeletal: Anterior vertebral endplate spurring at multiple levels in the lower thoracic spine. IMPRESSION: 1. Small right pneumothorax, new since previous. Critical Value/emergent results were called by telephone at the time of interpretation on 05/02/2021 at 10:17 am to provider [facility] , who verbally acknowledged these results. 2. Small pleural effusions right greater than left 3.  Emphysema (ICD10-J43.9). 4. Abdominal ascites 5. Coronary and Aortic Atherosclerosis (ICD10-170.0). Electronically Signed   By: Lucrezia Europe M.D.   On: 05/02/2021 10:18   ECHOCARDIOGRAM COMPLETE  Result Date: 05/03/2021    ECHOCARDIOGRAM REPORT   Patient Name:   ELA MOFFAT Date of Exam: 04/09/2021 Medical Rec #:  [id number]        Height:       63.0 in Accession #:    [id number]       Weight:       124.1 lb Date of Birth:  03-07-1929        BSA:          1.579 m Patient Age:    34 years         BP:           105/48 mmHg Patient Gender: F                HR:           85 bpm. Exam Location:  Inpatient Procedure: 2D Echo, Cardiac Doppler and Color Doppler Indications:    Elevated troponin  History:        Patient has prior history of Echocardiogram examinations, most                 recent 08/09/2018. Arrythmias:Atrial Fibrillation.  Sonographer:     Merrie Roof RDCS Referring Phys: [id number] [place]  1. Left ventricular ejection fraction, by estimation, is 60 to 65%. The left ventricle has normal function. The left ventricle has no regional wall motion abnormalities. Left ventricular diastolic parameters were normal.  2. Right ventricular systolic function is normal. The right ventricular size is normal.  3. No subcostal images.  4. A small pericardial effusion is present. The pericardial effusion is anterior to the right ventricle.  5. The mitral valve is normal in structure. No evidence of mitral valve regurgitation. No evidence of mitral stenosis.  6. Tricuspid valve regurgitation is moderate.  7. The aortic valve is tricuspid. Aortic valve regurgitation is not visualized. No aortic stenosis is present. FINDINGS  Left Ventricle: Left ventricular ejection fraction, by estimation, is 60 to 65%. The left ventricle has normal function. The left ventricle has no regional wall motion abnormalities. The left ventricular internal cavity size was normal in size. There is  no left ventricular hypertrophy. Left ventricular diastolic parameters were normal. Right Ventricle: The right ventricular size is normal. No increase in right ventricular wall thickness. Right ventricular systolic function is normal. Left Atrium: Left atrial size was normal in size. Right Atrium: Right atrial size was normal in size. Pericardium: A small pericardial effusion is present. The pericardial effusion is anterior to the right ventricle. Mitral Valve: The mitral valve is normal in structure. No evidence of mitral valve regurgitation. No evidence of mitral valve stenosis. Tricuspid Valve: The tricuspid valve is normal in structure. Tricuspid valve regurgitation is moderate . No evidence of tricuspid stenosis. Aortic Valve: The aortic valve is tricuspid. Aortic valve regurgitation is not visualized. No aortic stenosis is present. Pulmonic Valve: The pulmonic valve was normal  in structure.  Pulmonic valve regurgitation is mild. No evidence of pulmonic stenosis. Aorta: The aortic root is normal in size and structure. Venous: The inferior vena cava was not well visualized. IAS/Shunts: The interatrial septum was not well visualized. Jenkins Rouge MD Electronically signed by Jenkins Rouge MD Signature Date/Time: 04/26/2021/1:04:10 PM    Final     Scheduled Meds:  cholecalciferol  2,000 Units Oral BID   feeding supplement  237 mL Oral BID BM   mupirocin ointment  1 application Nasal BID   pantoprazole  40 mg Oral Daily   vitamin B-12  500 mcg Oral Daily   Continuous Infusions:  sodium chloride 100 mL/hr at 05/02/21 1133     LOS: 2 days   Time spent: 32 minutes   Darliss Cheney, MD [facility]  05/02/2021, 12:40 PM  Please page via [place]a [place] and do not message via secure chat for anything urgent. Secure chat can be used for anything non urgent.  How to contact the [facility] Attending or Consulting provider [place] or covering provider during after hours [place], for this patient?  Check the care team in [facility] and look for a) attending/consulting TRH provider listed and b) the [facility] team listed. Page or secure chat [phone]. Log into [email or web address] and use [place]'s universal password to access. If you do not have the password, please contact the hospital operator. Locate the [facility] provider you are looking for under [facility] and page to a number that you can be directly reached. If you still have difficulty reaching the provider, please page the [facility] (Director on Call) for the Hospitalists listed on amion for assistance.

## 2021-05-03 ENCOUNTER — Encounter (HOSPITAL_COMMUNITY): Payer: Self-pay | Admitting: Gastroenterology

## 2021-05-03 DIAGNOSIS — R627 Adult failure to thrive: Secondary | ICD-10-CM | POA: Diagnosis not present

## 2021-05-03 LAB — CBC
HCT: 43.1 % (ref 36.0–46.0)
Hemoglobin: 13.9 g/dL (ref 12.0–15.0)
MCH: 28 pg (ref 26.0–34.0)
MCHC: 32.3 g/dL (ref 30.0–36.0)
MCV: 86.9 fL (ref 80.0–100.0)
Platelets: 258 10*3/uL (ref 150–400)
RBC: 4.96 MIL/uL (ref 3.87–5.11)
RDW: 14.1 % (ref 11.5–15.5)
WBC: 10.5 10*3/uL (ref 4.0–10.5)
nRBC: 0 % (ref 0.0–0.2)

## 2021-05-03 LAB — BASIC METABOLIC PANEL
Anion gap: 7 (ref 5–15)
BUN: 39 mg/dL — ABNORMAL HIGH (ref 8–23)
CO2: 23 mmol/L (ref 22–32)
Calcium: 8.3 mg/dL — ABNORMAL LOW (ref 8.9–10.3)
Chloride: 108 mmol/L (ref 98–111)
Creatinine, Ser: 1.5 mg/dL — ABNORMAL HIGH (ref 0.44–1.00)
GFR, Estimated: 33 mL/min — ABNORMAL LOW (ref >60)
Glucose, Bld: 108 mg/dL — ABNORMAL HIGH (ref 70–99)
Potassium: 4 mmol/L (ref 3.5–5.1)
Sodium: 138 mmol/L (ref 135–145)

## 2021-05-03 LAB — PROTIME-INR
INR: 1.1 (ref 0.8–1.2)
Prothrombin Time: 14.7 seconds (ref 11.4–15.2)

## 2021-05-03 MED ORDER — ENOXAPARIN SODIUM 60 MG/0.6ML IJ SOSY
60.0000 mg | PREFILLED_SYRINGE | INTRAMUSCULAR | Status: DC
  Administered 2021-05-03 – 2021-05-04 (×2): 60 mg via SUBCUTANEOUS
  Filled 2021-05-03 (×3): qty 0.6

## 2021-05-03 NOTE — TOC Initial Note (Signed)
Transition of Care [facility]) - Initial/Assessment Note    Patient Details  Name: Hannah Stein MRN: [id number] Date of Birth: [birth year]  Transition of [facility]) CM/SW Contact:    Vinie Sill, LCSW Phone Number: 05/03/2021, 12:53 PM  Clinical Narrative:                  CSW spoke with patient's daughter (Hannah Stein)by phone. CSW introduced self and explained role. CSW discussed PT/OT recommendation of short term rehab at [facility]. Patient reports, patient lives home alone. She states she  works during  and can not be there. Including, she can not physically provided the level of care that is required of the patient at this time. She states the patient is aware she can not return home at this time. Patient has never been to rehab before- she has not received the covid vaccine and does not want the vaccine per her daughter. CSW explained the SNF process. Preferred SNF is [facility], [facility] or [facility], " I want somewhere where she is going to be taken care of". CSW was given permission to fax out to SNFs in the area. All questions answered.   CSW will continue to follow for final disposition plan CSW will provide bed offers once available  Thurmond Butts, MSW, LCSW Clinical Social Worker     Expected Discharge Plan: Skilled Nursing Facility Barriers to Discharge: Ship broker, Continued Medical Work up, SNF Pending bed offer   Patient Goals and CMS Choice        Expected Discharge Plan and Services Expected Discharge Plan: [place] In-house Referral: Clinical Social Work     Living arrangements for the past 2 months: Single Family Home                                      Prior Living Arrangements/Services Living arrangements for the past 2 months: Single Family Home Lives with:: Self Patient language and need for interpreter reviewed:: No        Need for Family Participation in Patient Care: Yes (Comment) Care giver support system  in place?: Yes (comment)   Criminal Activity/Legal Involvement Pertinent to Current Situation/Hospitalization: No - Comment as needed  Activities of Daily Living Home Assistive Devices/Equipment: Bedside commode/3-in-1 ADL Screening (condition at time of admission) Patient's cognitive ability adequate to safely complete daily activities?: Yes Is the patient deaf or have difficulty hearing?: No Does the patient have difficulty seeing, even when wearing glasses/contacts?: No Does the patient have difficulty concentrating, remembering, or making decisions?: Yes Patient able to express need for assistance with ADLs?: Yes Does the patient have difficulty dressing or bathing?: Yes Independently performs ADLs?: No Communication: Independent Feeding: Independent Does the patient have difficulty walking or climbing stairs?: Yes Weakness of Legs: Both Weakness of Arms/Hands: Both  Permission Sought/Granted Permission sought to share information with : Family Supports    Share Information with NAME: Hannah Stein  Permission granted to share info w AGENCY: SNFs  Permission granted to share info w Relationship: daughter  Permission granted to share info w Contact Information: [phone]  Emotional Assessment       Orientation: : Oriented to Self, Oriented to Place, Oriented to  Time, Oriented to Situation Alcohol / Substance Use: Not Applicable Psych Involvement: No (comment)  Admission diagnosis:  Nausea [R11.0] Failure to thrive in adult [R62.7] Colonic mass [K63.89] Ascites [R18.8] Abdominal pain, unspecified abdominal  location [R10.9] Cirrhosis of liver with ascites, unspecified hepatic cirrhosis type ([place]) [K74.60, R18.8] Nausea and vomiting, unspecified vomiting type [R11.2] Patient Active Problem List   Diagnosis Date Noted   Pressure injury of skin 04/30/2021   Failure to thrive in adult 04/30/2021   Acute kidney injury superimposed on chronic kidney disease ([place])  04/28/2021   Ascites 04/24/2021   possible sigmoid colon mass 04/17/2021   Elevated troponin 04/13/2021   Paroxysmal atrial fibrillation ([place]) 09/05/2018   Leukocytosis    PCP:  Kelton Pillar, MD Pharmacy:   [facility] [place], [place] [address] Phone: [phone] Fax: [phone]     Social Determinants of Health (SDOH) Interventions    Readmission Risk Interventions No flowsheet data found.

## 2021-05-03 NOTE — Progress Notes (Signed)
PROGRESS NOTE    KORALEE WEDEKING  KCL:[id number] DOB: 11-23-1928 DOA: 04/27/2021 PCP: Kelton Pillar, MD   Brief Narrative:  Hannah Stein is a 87 y.o. female with medical history significant of PAF on xarelto, COPD,  who presented with abdominal pain, N/V and poor PO intake with weight loss. she also has epigastric pain since middle of October. Colonoscopy around 38-61 years of age. Report normal except for hemorrhoids.  Upon arrival to ED, she was fairly hemodynamically stable and afebrile. CT abdomen pelvis shows Abnormal circumferential wall thickening in the sigmoid colon possible mass measuring 8.2 x 3.4 cm.  Possibility of malignant neoplastic process not excluded.  2.9 cm infrarenal abdominal aortic aneurysm.  Cirrhosis of liver with large ascites.  She was admitted under hospitalist service.  Underwent paracentesis with removal of 4 L of fluid, cytology pending.  GI consulted.  Underwent colonoscopy 04/10/2021 was found to have partially obstructive sigmoid mass.  General surgery consulted.    Assessment & Plan:   Principal Problem:   Failure to thrive in adult Active Problems:   Leukocytosis   Paroxysmal atrial fibrillation (HCC)   Acute kidney injury superimposed on chronic kidney disease (HCC)   Ascites   possible sigmoid colon mass   Elevated troponin   Pressure injury of skin  Failure to thrive in adult/denies weakness: Likely secondary to malignancy.  Nutrition, PT, OT consulted.   sigmoid colon mass with possible cancer: -CT with concerning findings. Abnormal wall thickening in the sigmoid colon. Possible circumferential mass in sigmoid colon measuring approximately 8.2x 3.4cm.  S/p colonoscopy 04/26/2021 which shows partially obstructing sigmoid mass, biopsy taken, pathology pending.  General surgery was consulted per GI recommendation.  Per general surgery, nothing much to say or do until pathology report is back.  Per GI note, they will consider possible colonic  stent if no surgical option.  Waiting for both general surgery and GI to come up with a final plan about this patient.  No role of oncology consultation until we have pathology report back.  CT chest, abdomen and pelvis did not show any mets.  But CT chest did show small pneumothorax on the right side.  GI (Dr. B and Dr. Paulita Fujita of [facility]) was asked yesterday to see patient and clarify plans.  Family also has questions about the plan.  Small right-sided pneumothorax: Patient asymptomatic.  Monitor.  Pete chest x-ray tomorrow.   Hepatic cirrhosis with large ascites: Status post diagnostic and therapeutic paracentesis with retrieval of 4 L of fluid on 04/30/2021.  Cytology pending.   AKI on CKD stage IIIa: Baseline creatinine seems to be around 1.1 GFR 45.  Presented with creatinine of 1.61 and GFR 30.  Now creatinine improving.  Continue IV fluids and repeat labs in the morning.   Paroxysmal atrial fibrillation: Rate controlled.  Diltiazem stopped due to junctional rhythm.  Rates are controlled at this point in time.  Xarelto still on hold.  Continue to hold until plans are clarified from general surgery and GI.  GI had cleared her to resume anticoagulation 24 hours after biopsy.  In the meantime, we will start her on full dose Lovenox.   Elevated troponin: Very small elevation and flat with no ACS symptoms, indicates demand ischemia.  Will update echo.  She will need that for surgery if she were to have that surgery as well.    Leukocytosis: Nonspecific and resolved.  History hypertension: Blood pressure normal here and she is not on any medications either.  Generalized weakness: PT OT recommended SNF.  TOC consulted.   Infrarenal abdominal aortic aneurysm -2.9cm, follow outpatient   DVT prophylaxis: Place TED hose Start: 04/13/2021 1846   Code Status: Full Code  Family Communication: None at bedside today.  Status is: Inpatient  Remains inpatient appropriate because: Needs further work-up  and plan for possible sigmoid mass.   Estimated body mass index is 22.1 kg/m as calculated from the following:   Height as of this encounter: 5\' 3"  (1.6 m).   Weight as of this encounter: 56.6 kg.  Pressure Injury 04/30/2021 Elbow Posterior;Left Stage 2 -  Partial thickness loss of dermis presenting as a shallow open injury with a red, pink wound bed without slough. Red, open (Active)  04/07/2021 2300  Location: Elbow  Location Orientation: Posterior;Left  Staging: Stage 2 -  Partial thickness loss of dermis presenting as a shallow open injury with a red, pink wound bed without slough.  Wound Description (Comments): Red, open  Present on Admission: Yes    Nutritional Assessment: Body mass index is 22.1 kg/m.Marland Kitchen Seen by dietician.  I agree with the assessment and plan as outlined below: Nutrition Status: Nutrition Problem: Inadequate oral intake Etiology: nausea, vomiting Skin Assessment: I have examined the patient's skin and I agree with the wound assessment as performed by the wound care RN as outlined below: Pressure Injury 05/05/2021 Elbow Posterior;Left Stage 2 -  Partial thickness loss of dermis presenting as a shallow open injury with a red, pink wound bed without slough. Red, open (Active)  04/15/2021 2300  Location: Elbow  Location Orientation: Posterior;Left  Staging: Stage 2 -  Partial thickness loss of dermis presenting as a shallow open injury with a red, pink wound bed without slough.  Wound Description (Comments): Red, open  Present on Admission: Yes    Consultants:  GI General surgery  Procedures:  None  Antimicrobials:  Anti-infectives (From admission, onward)    None          Subjective:  Patient seen and examined.  She has no complaints.  Objective: Vitals:   05/02/21 2101 05/02/21 2305 05/03/21 0325 05/03/21 0755  BP: 123/60 116/61  (!) 111/55  Pulse: 80 82 75 74  Resp: 18 19  19   Temp: 97.8 F (36.6 C) 97.9 F (36.6 C) 98 F (36.7 C) 97.6 F  (36.4 C)  TempSrc: Oral Oral Oral Oral  SpO2: 98% 95% 94% 95%  Weight:      Height:        Intake/Output Summary (Last 24 hours) at 05/03/2021 1234 Last data filed at 05/02/2021 2000 Gross per 24 hour  Intake 360 ml  Output --  Net 360 ml    Filed Weights   04/17/2021 0325 04/11/2021 0827 05/02/21 0305  Weight: 56.3 kg 56.3 kg 56.6 kg    Examination:   General exam: Appears calm and comfortable  Respiratory system: Clear to auscultation. Respiratory effort normal. Cardiovascular system: S1 & S2 heard, RRR. No JVD, murmurs, rubs, gallops or clicks. No pedal edema. Gastrointestinal system: Abdomen is nondistended, soft and nontender. No organomegaly or masses felt. Normal bowel sounds heard. Central nervous system: Alert and oriented. No focal neurological deficits. Extremities: Symmetric 5 x 5 power. Skin: No rashes, lesions or ulcers.  Psychiatry: Judgement and insight appear normal. Mood & affect appropriate.    Data Reviewed: I have personally reviewed following labs and imaging studies  CBC: Recent Labs  Lab 04/23/2021 1400 04/30/21 0120 05/03/2021 1104 05/02/21 0210 05/03/21 0132  WBC  11.6* 9.7 14.9* 15.7* 10.5  NEUTROABS 9.6*  --  12.7* 13.3*  --   HGB 15.0 13.1 16.7* 16.3* 13.9  HCT 47.8* 41.7 53.7* 50.7* 43.1  MCV 88.2 87.6 88.3 86.7 86.9  PLT 342 280 324 351 973    Basic Metabolic Panel: Recent Labs  Lab 05/05/2021 1400 04/30/21 0120 05/03/2021 1104 05/02/21 0210 05/03/21 0132  NA 143 142 143 138 138  K 4.0 3.8 4.6 5.3* 4.0  CL 108 109 109 105 108  CO2 26 26 22 23 23   GLUCOSE 110* 94 108* 147* 108*  BUN 45* 40* 39* 42* 39*  CREATININE 1.61* 1.34* 1.53* 1.70* 1.50*  CALCIUM 9.9 9.3 9.0 9.3 8.3*    GFR: Estimated Creatinine Clearance: 20.6 mL/min (A) (by C-G formula based on SCr of 1.5 mg/dL (H)). Liver Function Tests: Recent Labs  Lab 05/03/2021 1400 04/30/21 0120  AST 27 25  ALT 23 23  ALKPHOS 51 46  BILITOT 1.0 0.9  PROT 5.2* 4.5*  ALBUMIN  2.9* 2.4*    Recent Labs  Lab 04/10/2021 1400  LIPASE 32    No results for input(s): AMMONIA in the last 168 hours. Coagulation Profile: Recent Labs  Lab 04/07/2021 1722  INR 1.2    Cardiac Enzymes: No results for input(s): CKTOTAL, CKMB, CKMBINDEX, TROPONINI in the last 168 hours. BNP (last 3 results) No results for input(s): PROBNP in the last 8760 hours. HbA1C: No results for input(s): HGBA1C in the last 72 hours. CBG: No results for input(s): GLUCAP in the last 168 hours. Lipid Profile: No results for input(s): CHOL, HDL, LDLCALC, TRIG, CHOLHDL, LDLDIRECT in the last 72 hours. Thyroid Function Tests: No results for input(s): TSH, T4TOTAL, FREET4, T3FREE, THYROIDAB in the last 72 hours. Anemia Panel: No results for input(s): VITAMINB12, FOLATE, FERRITIN, TIBC, IRON, RETICCTPCT in the last 72 hours. Sepsis Labs: No results for input(s): PROCALCITON, LATICACIDVEN in the last 168 hours.  Recent Results (from the past 240 hour(s))  Resp Panel by RT-PCR (Flu A&B, Covid) Nasopharyngeal Swab     Status: None   Collection Time: 04/24/2021  2:55 PM   Specimen: Nasopharyngeal Swab; Nasopharyngeal(NP) swabs in vial transport medium  Result Value Ref Range Status   SARS Coronavirus 2 by RT PCR NEGATIVE NEGATIVE Final    Comment: (NOTE) SARS-CoV-2 target nucleic acids are NOT DETECTED.  The SARS-CoV-2 RNA is generally detectable in upper respiratory specimens during the acute phase of infection. The lowest concentration of SARS-CoV-2 viral copies this assay can detect is 138 copies/mL. A negative result does not preclude SARS-Cov-2 infection and should not be used as the sole basis for treatment or other patient management decisions. A negative result may occur with  improper specimen collection/handling, submission of specimen other than nasopharyngeal swab, presence of viral mutation(s) within the areas targeted by this assay, and inadequate number of viral copies(<138  copies/mL). A negative result must be combined with clinical observations, patient history, and epidemiological information. The expected result is Negative.  Fact Sheet for Patients:  EntrepreneurPulse.com.au  Fact Sheet for Healthcare Providers:  IncredibleEmployment.be  This test is no t yet approved or cleared by the [place] FDA and  has been authorized for detection and/or diagnosis of SARS-CoV-2 by FDA under an Emergency Use Authorization (EUA). This EUA will remain  in effect (meaning this test can be used) for the duration of the COVID-19 declaration under Section 564(b)(1) of the Act, 21 U.S.C.section 360bbb-3(b)(1), unless the authorization is terminated  or revoked sooner.  Influenza A by PCR NEGATIVE NEGATIVE Final   Influenza B by PCR NEGATIVE NEGATIVE Final    Comment: (NOTE) The Xpert Xpress SARS-CoV-2/FLU/RSV plus assay is intended as an aid in the diagnosis of influenza from Nasopharyngeal swab specimens and should not be used as a sole basis for treatment. Nasal washings and aspirates are unacceptable for Xpert Xpress SARS-CoV-2/FLU/RSV testing.  Fact Sheet for Patients: EntrepreneurPulse.com.au  Fact Sheet for Healthcare Providers: IncredibleEmployment.be  This test is not yet approved or cleared by the [place] FDA and has been authorized for detection and/or diagnosis of SARS-CoV-2 by FDA under an Emergency Use Authorization (EUA). This EUA will remain in effect (meaning this test can be used) for the duration of the COVID-19 declaration under Section 564(b)(1) of the Act, 21 U.S.C. section 360bbb-3(b)(1), unless the authorization is terminated or revoked.  Performed at [facility], [place] [address] [place], [place] [address]   Aerobic/Anaerobic Culture w Gram Stain (surgical/deep wound)     Status: None (Preliminary result)   Collection Time: 04/30/21 11:23  AM   Specimen: PATH Cytology Peritoneal fluid  Result Value Ref Range Status   Specimen Description PERITONEAL FLUID  Final   Special Requests NONE  Final   Gram Stain   Final    RARE WBC PRESENT,BOTH PMN AND MONONUCLEAR NO ORGANISMS SEEN    Culture   Final    NO GROWTH 3 DAYS NO ANAEROBES ISOLATED; CULTURE IN PROGRESS FOR 5 DAYS Performed at [facility], [place] [address] [place], [place] [address]    Report Status PENDING  Incomplete  Surgical PCR screen     Status: None   Collection Time: 04/30/21  8:54 PM   Specimen: Nasal Mucosa; Nasal Swab  Result Value Ref Range Status   MRSA, PCR NEGATIVE NEGATIVE Final   Staphylococcus aureus NEGATIVE NEGATIVE Final    Comment: (NOTE) The Xpert SA Assay (FDA approved for NASAL specimens in patients 25 years of age and older), is one component of a comprehensive surveillance program. It is not intended to diagnose infection nor to guide or monitor treatment. Performed at [facility], [place] [address], [place], [place] [address]        Radiology Studies: CT CHEST WO CONTRAST  Result Date: 05/02/2021 CLINICAL DATA:  Colorectal cancer, staging EXAM: CT CHEST WITHOUT CONTRAST TECHNIQUE: Multidetector CT imaging of the chest was performed following the standard protocol without IV contrast. COMPARISON:  CT abdomen 04/20/2021 FINDINGS: Cardiovascular: Heart size normal. Small pericardial effusion. Dilated central pulmonary arteries suggesting pulmonary hypertension. 3-vessel coronary calcifications. Atheromatous thoracic aorta without aneurysm. Mediastinum/Nodes: Small hiatal hernia. No definite mass or adenopathy, sensitivity decreased without IV contrast. Heterogenous mildly enlarged right thyroid lobe; In the setting of significant comorbidities or limited life expectancy, no follow-up recommended (ref: J Am Coll Radiol. 2015 Feb;12(2): 143-50). Lungs/Pleura: Small pleural effusions right greater than left, without evidence of  pleural nodularity or loculation. Small right pneumothorax estimated less than 10%, which was not evident on the prior study. Pulmonary emphysema. Pleural based calcifications and scarring in the posterior right lung apex. Bandlike scarring or atelectasis in the anteroinferior right middle lobe. Dependent atelectasis/consolidation posteriorly at the right lung base. Upper Abdomen: Abdominal ascites. Musculoskeletal: Anterior vertebral endplate spurring at multiple levels in the lower thoracic spine. IMPRESSION: 1. Small right pneumothorax, new since previous. Critical Value/emergent results were called by telephone at the time of interpretation on 05/02/2021 at 10:17 am to provider [facility] , who verbally acknowledged  these results. 2. Small pleural effusions right greater than left 3.  Emphysema (ICD10-J43.9). 4. Abdominal ascites 5. Coronary and Aortic Atherosclerosis (ICD10-170.0). Electronically Signed   By: Lucrezia Europe M.D.   On: 05/02/2021 10:18    Scheduled Meds:  cholecalciferol  2,000 Units Oral BID   feeding supplement  237 mL Oral BID BM   mupirocin ointment  1 application Nasal BID   pantoprazole  40 mg Oral Daily   vitamin B-12  500 mcg Oral Daily   Continuous Infusions:  sodium chloride 100 mL/hr at 05/03/21 0907     LOS: 3 days   Time spent: 25 minutes   Darliss Cheney, MD [facility]  05/03/2021, 12:34 PM  Please page via Shea Evans and do not message via secure chat for anything urgent. Secure chat can be used for anything non urgent.  How to contact the [facility] Attending or Consulting provider [place] or covering provider during after hours [place], for this patient?  Check the care team in [facility] and look for a) attending/consulting [facility] provider listed and b) the [facility] team listed. Page or secure chat [phone]. Log into [email or web address] and use [place]'s universal password to access. If you do not have the password, please contact the hospital operator. Locate the [employer] provider  you are looking for under [facility] and page to a number that you can be directly reached. If you still have difficulty reaching the provider, please page the [facility] (Director on Call) for the Hospitalists listed on amion for assistance.

## 2021-05-03 NOTE — Progress Notes (Signed)
ANTICOAGULATION CONSULT NOTE - Initial Consult  Pharmacy Consult for Lovenox Indication: atrial fibrillation  No Known Allergies  Patient Measurements: Height: 5\' 3"  (160 cm) Weight: 56.6 kg (124 lb 12.5 oz) IBW/kg (Calculated) : 52.4  Vital Signs: Temp: 97.6 F (36.4 C) (11/27 0755) Temp Source: Oral (11/27 0755) BP: 111/55 (11/27 0755) Pulse Rate: 74 (11/27 0755)  Labs: Recent Labs    04/26/2021 1104 05/02/21 0210 05/03/21 0132  HGB 16.7* 16.3* 13.9  HCT 53.7* 50.7* 43.1  PLT 324 351 258  CREATININE 1.53* 1.70* 1.50*    Estimated Creatinine Clearance: 20.6 mL/min (A) (by C-G formula based on SCr of 1.5 mg/dL (H)).  Medical History: Past Medical History:  Diagnosis Date   DVT, lower extremity (HCC)     Medications:  Infusions:   sodium chloride 100 mL/hr at 05/03/21 0907    Assessment: PTA AC 87 yo female with paroxysmal atrial fibrillation on Xarelto prior to admission, last dose 11/22 1400. Pharmacy consulted for treatment enoxaparin dosing.  CBC stable - Hgb 16, Plt 258.   Goal of Therapy:   Monitor platelets by anticoagulation protocol: Yes   Plan:  Enoxaparin 60 mg every 24 hours (1 mg/kg q24h with CrCL <30) CBC every 3 days  Laurey Arrow, PharmD PGY1 Pharmacy Resident 05/03/2021  12:41 PM  Please check [email or web address] for unit-specific pharmacy phone numbers.

## 2021-05-03 NOTE — NC FL2 (Signed)
[place] LEVEL OF CARE SCREENING TOOL     IDENTIFICATION  Patient Name: Hannah Stein Birthdate: 1929-01-02 Sex: female Admission Date (Current Location): 04/14/2021  [facility] and [place] Number:  Herbalist and Address:  [facility], [place] [address], [place], [place] [address]      Provider Number: [id number]  Attending Physician Name and Address:  Darliss Cheney, MD  Relative Name and Phone Number:  [facility]ani [facility] [place]: Hospital Recommended Level of Care: [place] Prior Approval Number:    Date Approved/Denied:   PASRR Number: [id number]  Discharge Plan: SNF    Current Diagnoses: Patient Active Problem List   Diagnosis Date Noted   Pressure injury of skin 04/30/2021   Failure to thrive in adult 04/09/2021   Acute kidney injury superimposed on chronic kidney disease ([place]) 04/10/2021   Ascites 04/28/2021   possible sigmoid colon mass 04/11/2021   Elevated troponin 04/08/2021   Paroxysmal atrial fibrillation ([place]) 09/05/2018   Leukocytosis     Orientation RESPIRATION BLADDER Height & Weight     Self, Time, Situation, Place  Normal Incontinent Weight: 124 lb 12.5 oz (56.6 kg) Height:  5\' 3"  (160 cm)  BEHAVIORAL SYMPTOMS/MOOD NEUROLOGICAL BOWEL NUTRITION STATUS      Incontinent Diet (See DC summary)  AMBULATORY STATUS COMMUNICATION OF NEEDS Skin   Limited Assist Verbally PU Stage and Appropriate Care (Pressure Injury 05/02/2021 Elbow Posterior;Left Stage 2 -  Partial thickness loss of dermis presenting as a shallow open injury with a red, pink wound bed without slough. Red, open. Ecchymosis arm.)                       Personal Care Assistance Level of Assistance  Bathing, Feeding, Dressing Bathing Assistance: Limited assistance Feeding assistance: Independent Dressing Assistance: Limited assistance     Functional Limitations Info  Sight, Hearing, Speech Sight Info:  Adequate Hearing Info: Adequate Speech Info: Adequate    SPECIAL CARE FACTORS FREQUENCY  PT (By licensed PT), OT (By licensed OT)     PT Frequency: 5x per week OT Frequency: 5x per week            Contractures Contractures Info: Not present    Additional Factors Info  Code Status, Allergies Code Status Info: Full Allergies Info: NKA           Current Medications (05/03/2021):  This is the current hospital active medication list Current Facility-Administered Medications  Medication Dose Route Frequency Provider Last Rate Last Admin   0.9 %  sodium chloride infusion   Intravenous Continuous Darliss Cheney, MD 100 mL/hr at 05/03/21 0907 New Bag at 05/03/21 0907   acetaminophen (TYLENOL) tablet 650 mg  650 mg Oral Q6H PRN Orma Flaming, MD       Or   acetaminophen (TYLENOL) suppository 650 mg  650 mg Rectal Q6H PRN Orma Flaming, MD       albuterol (PROVENTIL) (2.5 MG/3ML) 0.083% nebulizer solution 2.5 mg  2.5 mg Nebulization Q6H PRN Orma Flaming, MD       cholecalciferol (VITAMIN D3) tablet 2,000 Units  2,000 Units Oral BID Orma Flaming, MD   2,000 Units at 05/03/21 1308   enoxaparin (LOVENOX) injection 60 mg  60 mg Subcutaneous Q24H Ueland, Emma M, RPH       feeding supplement (ENSURE ENLIVE / ENSURE PLUS) liquid 237 mL  237 mL Oral BID BM Pahwani, Ravi,  MD   237 mL at 05/03/21 1307   HYDROmorphone (DILAUDID) injection 0.5 mg  0.5 mg Intravenous Q4H PRN Orma Flaming, MD       mupirocin ointment (BACTROBAN) 2 % 1 application  1 application Nasal BID Darliss Cheney, MD   1 application at 00/86/76 2202   ondansetron (ZOFRAN) tablet 4 mg  4 mg Oral Q6H PRN Orma Flaming, MD       Or   ondansetron [facility]) injection 4 mg  4 mg Intravenous Q6H PRN Orma Flaming, MD   4 mg at 05/02/21 1950   oxyCODONE (Oxy IR/ROXICODONE) immediate release tablet 5 mg  5 mg Oral Q6H PRN Orma Flaming, MD       pantoprazole (PROTONIX) EC tablet 40 mg  40 mg Oral Daily Pham, Minh Q, RPH-CPP    40 mg at 05/03/21 1307   vitamin B-12 (CYANOCOBALAMIN) tablet 500 mcg  500 mcg Oral Daily Orma Flaming, MD   500 mcg at 05/03/21 1307     Discharge Medications: Please see discharge summary for a list of discharge medications.  Relevant Imaging Results:  Relevant Lab Results:   Additional Information SSN# [id number] Pt has been vaccinated  Bary Castilla, LCSW

## 2021-05-03 NOTE — Progress Notes (Signed)
Subjective: Weak, not hungry  Objective: Vital signs in last 24 hours: Temp:  [97.6 F (36.4 C)-98 F (36.7 C)] 97.6 F (36.4 C) (11/27 0755) Pulse Rate:  [74-84] 74 (11/27 0755) Resp:  [18-20] 19 (11/27 0755) BP: (108-123)/(55-74) 111/55 (11/27 0755) SpO2:  [94 %-99 %] 95 % (11/27 0755) Weight change:  Last BM Date: 05/02/21  PE: GEN:  Elderly, frail, cachectic-appearing ABD:  Moderate ascites, mixed tympany and dullness, non-tender  Lab Results: CBC    Component Value Date/Time   WBC 10.5 05/03/2021 0132   RBC 4.96 05/03/2021 0132   HGB 13.9 05/03/2021 0132   HCT 43.1 05/03/2021 0132   PLT 258 05/03/2021 0132   MCV 86.9 05/03/2021 0132   MCH 28.0 05/03/2021 0132   MCHC 32.3 05/03/2021 0132   RDW 14.1 05/03/2021 0132   LYMPHSABS 0.6 (L) 05/02/2021 0210   MONOABS 1.7 (H) 05/02/2021 0210   EOSABS 0.0 05/02/2021 0210   BASOSABS 0.0 05/02/2021 0210  CMP     Component Value Date/Time   NA 138 05/03/2021 0132   K 4.0 05/03/2021 0132   CL 108 05/03/2021 0132   CO2 23 05/03/2021 0132   GLUCOSE 108 (H) 05/03/2021 0132   BUN 39 (H) 05/03/2021 0132   CREATININE 1.50 (H) 05/03/2021 0132   CALCIUM 8.3 (L) 05/03/2021 0132   PROT 4.5 (L) 04/30/2021 0120   ALBUMIN 2.4 (L) 04/30/2021 0120   AST 25 04/30/2021 0120   ALT 23 04/30/2021 0120   ALKPHOS 46 04/30/2021 0120   BILITOT 0.9 04/30/2021 0120   GFRNONAA 33 (L) 05/03/2021 0132   GFRAA 53 (L) 08/09/2018 0332   Assessment:   Failure to thrive.  Weight loss.  Partially obstructing rectosigmoid mass.  Newly diagnosed cirrhosis, with ascites.  Plan:   Patient is weak and frail-appearing, but after extensive discussion with daughter and son-in-law, patient was living completely independently a couple weeks ago, thus her current status is a significant and abrupt change.  I am not convinced patient, at this time, is a good chemotherapy or surgical candidate.    At the present time, I feel best option is to await biopsy  results and discuss case with Dr. Watt Climes (our partner who does colonic stents) to see if patient is candidate for colonic stent during this admission.  If she is able to have stent, this perhaps might help improve her nutritive status to help improve patient's candidacy for any other further more aggressive therapies (e.g., surgery, chemotherapy). [facility] will revisit tomorrow.   Hannah Stein 05/03/2021, 12:36 PM   Cell [phone] If no answer or after 5 PM call [phone]

## 2021-05-04 ENCOUNTER — Inpatient Hospital Stay (HOSPITAL_COMMUNITY): Payer: Medicare Other

## 2021-05-04 ENCOUNTER — Encounter (HOSPITAL_COMMUNITY): Payer: Self-pay | Admitting: Gastroenterology

## 2021-05-04 DIAGNOSIS — R627 Adult failure to thrive: Secondary | ICD-10-CM | POA: Diagnosis not present

## 2021-05-04 LAB — BASIC METABOLIC PANEL
Anion gap: 12 (ref 5–15)
BUN: 38 mg/dL — ABNORMAL HIGH (ref 8–23)
CO2: 18 mmol/L — ABNORMAL LOW (ref 22–32)
Calcium: 8.6 mg/dL — ABNORMAL LOW (ref 8.9–10.3)
Chloride: 108 mmol/L (ref 98–111)
Creatinine, Ser: 1.5 mg/dL — ABNORMAL HIGH (ref 0.44–1.00)
GFR, Estimated: 33 mL/min — ABNORMAL LOW (ref >60)
Glucose, Bld: 134 mg/dL — ABNORMAL HIGH (ref 70–99)
Potassium: 5.2 mmol/L — ABNORMAL HIGH (ref 3.5–5.1)
Sodium: 138 mmol/L (ref 135–145)

## 2021-05-04 LAB — CBC
HCT: 52.7 % — ABNORMAL HIGH (ref 36.0–46.0)
Hemoglobin: 16.5 g/dL — ABNORMAL HIGH (ref 12.0–15.0)
MCH: 27.5 pg (ref 26.0–34.0)
MCHC: 31.3 g/dL (ref 30.0–36.0)
MCV: 88 fL (ref 80.0–100.0)
Platelets: 415 10*3/uL — ABNORMAL HIGH (ref 150–400)
RBC: 5.99 MIL/uL — ABNORMAL HIGH (ref 3.87–5.11)
RDW: 14.6 % (ref 11.5–15.5)
WBC: 16.3 10*3/uL — ABNORMAL HIGH (ref 4.0–10.5)
nRBC: 0 % (ref 0.0–0.2)

## 2021-05-04 LAB — SURGICAL PATHOLOGY

## 2021-05-04 LAB — POTASSIUM: Potassium: 4.5 mmol/L (ref 3.5–5.1)

## 2021-05-04 LAB — CYTOLOGY - NON PAP

## 2021-05-04 MED ORDER — PANTOPRAZOLE SODIUM 40 MG IV SOLR
40.0000 mg | INTRAVENOUS | Status: DC
  Administered 2021-05-05 – 2021-05-09 (×4): 40 mg via INTRAVENOUS
  Filled 2021-05-04 (×5): qty 40

## 2021-05-04 MED ORDER — PANTOPRAZOLE SODIUM 40 MG IV SOLR
40.0000 mg | INTRAVENOUS | Status: DC
Start: 2021-05-05 — End: 2021-05-04

## 2021-05-04 NOTE — Care Management Important Message (Signed)
Important Message  Patient Details  Name: Hannah Stein MRN: [id number] Date of Birth: 10-Aug-1928   Medicare Important Message Given:  Yes     Shelda Altes 05/04/2021, 10:46 AM

## 2021-05-04 NOTE — Anesthesia Postprocedure Evaluation (Signed)
Anesthesia Post Note  Patient: Hannah Stein  Procedure(s) Performed: COLONOSCOPY WITH PROPOFOL BIOPSY SUBMUCOSAL TATTOO INJECTION     Patient location during evaluation: PACU Anesthesia Type: General Level of consciousness: sedated and patient cooperative Pain management: pain level controlled Vital Signs Assessment: post-procedure vital signs reviewed and stable Respiratory status: spontaneous breathing Cardiovascular status: stable Anesthetic complications: no   No notable events documented.  Last Vitals:  Vitals:   05/04/21 1625 05/04/21 2018  BP: 131/65 (!) 136/56  Pulse: 92 90  Resp: 20 20  Temp: (!) 36.4 C 36.4 C  SpO2: 98% 98%    Last Pain:  Vitals:   05/04/21 2018  TempSrc: Oral  PainSc:                  Nolon Nations

## 2021-05-04 NOTE — Progress Notes (Signed)
Hannah Stein 10:05 AM  Subjective: Patient with periodic vomiting in her hospital computer chart reviewed case discussed with my partner Dr. Paulita Fujita and her case discussed with her son-in-law as well and she has no new complaints and her abdominal pain is better except for when she vomits she says she is moving her bowels somewhat and says she has been sick at home since October and has no new complaints  Objective: Vital signs stable afebrile no acute distress abdomen is soft nontender labs about the same probably somewhat dehydrated intravascularly white count back up to baseline  Assessment: Probably metastatic colon cancer  Plan: Await path and I  Discussed the risk benefits methods and success rate and long-term problems of colonic stent with patient and her son-in-law and we will think about that for later in the week but wait on diagnosis and surgical opinion and we did discuss how she might need an ostomy  bag for decompression if the surgeons were not able to remove it completely  [facility]  office [phone] After 5PM or if no answer call [phone]

## 2021-05-04 NOTE — Progress Notes (Addendum)
PROGRESS NOTE    Hannah Stein  WRU:[id number] DOB: [birth year] DOA: 04/20/2021 PCP: Kelton Pillar, MD   Brief Narrative:  Hannah Stein is a 87 y.o. female with medical history significant of PAF on xarelto, COPD,  who presented with abdominal pain, N/V and poor PO intake with weight loss. she also has epigastric pain since middle of October. Colonoscopy around 6-18 years of age. Report normal except for hemorrhoids.  Upon arrival to ED, she was fairly hemodynamically stable and afebrile. CT abdomen pelvis shows Abnormal circumferential wall thickening in the sigmoid colon possible mass measuring 8.2 x 3.4 cm.  Possibility of malignant neoplastic process not excluded.  2.9 cm infrarenal abdominal aortic aneurysm.  Cirrhosis of liver with large ascites.  She was admitted under hospitalist service.  Underwent paracentesis with removal of 4 L of fluid, cytology pending.  GI consulted.  Underwent colonoscopy 04/14/2021 was found to have partially obstructive sigmoid mass.  General surgery consulted.   Assessment & Plan:   Principal Problem:   Failure to thrive in adult Active Problems:   Leukocytosis   Paroxysmal atrial fibrillation (HCC)   Acute kidney injury superimposed on chronic kidney disease (HCC)   Ascites   possible sigmoid colon mass   Elevated troponin   Pressure injury of skin  Failure to thrive in adult/generalized weakness: Likely secondary to malignancy.  Nutrition, PT, OT consulted.  PT recommended SNF.  TOC consulted.  Partial small bowel obstruction: Patient tells me that she had vomited in the middle of the night and currently she is nauseous.  She is not sure if she is passing gas.  Abdominal x-ray repeated which shows partial small bowel obstruction.  We will de-escalate her to full liquid diet.  If further vomiting, may need NG tube placement.  Zofran as needed for nausea vomiting.   sigmoid colon mass with possible cancer: -CT with concerning findings.  Abnormal wall thickening in the sigmoid colon. Possible circumferential mass in sigmoid colon measuring approximately 8.2x 3.4cm.  S/p colonoscopy 04/17/2021 which shows partially obstructing sigmoid mass, biopsy taken, pathology pending.  General surgery was consulted per GI recommendation.  Both general surgery and GI are waiting for final pathology results before formulating a final plan for this poor lady.  If no cancer and no plans from surgery, potentially colonic stent by GI.  I am also going to consult palliative care.  Small right-sided pneumothorax: Patient asymptomatic.  Repeating chest x-ray today.  Addendum 1:14 PM: Repeat chest x-ray shows slightly increased pneumothorax in the right apex but patient remains asymptomatic without any hypoxia.  Curb sided with Dr. Cicero Duck of PCCM who recommended to keep patient on oxygen with a goal to keep oxygen saturation at 100% and repeat chest x-ray yearly to follow resolution.  We will do that.   Hepatic cirrhosis with large ascites: Status post diagnostic and therapeutic paracentesis with retrieval of 4 L of fluid on 04/30/2021.  Cytology pending.   AKI on CKD stage IIIa: Baseline creatinine seems to be around 1.1 GFR 45.  Presented with creatinine of 1.61 and GFR 30.  Creatinine improved but stable compared to yesterday.  Hyperkalemia: Mild 5.2.  Hopefully this will resolve with IV fluids.     Paroxysmal atrial fibrillation: Rate controlled.  Diltiazem stopped due to junctional rhythm.  Rates are controlled at this point in time.  Continue Lovenox full dose and hold Xarelto until plans from surgery and GI finalized.   Elevated troponin: Very small elevation and flat with no  ACS symptoms, indicates demand ischemia.  Will update echo.  She will need that for surgery if she were to have that surgery as well.    Leukocytosis: Nonspecific and resolved.  History hypertension: Blood pressure normal here and she is not on any medications  either.  Infrarenal abdominal aortic aneurysm -2.9cm, follow outpatient   DVT prophylaxis: Place TED hose Start: 04/27/2021 1846   Code Status: Full Code  Family Communication: None at bedside today.  GI discussed with patient's family.  Status is: Inpatient  Remains inpatient appropriate because: Needs further work-up and plan for possible sigmoid mass.   Estimated body mass index is 21.48 kg/m as calculated from the following:   Height as of this encounter: 5\' 3"  (1.6 m).   Weight as of this encounter: 55 kg.  Pressure Injury 04/26/2021 Elbow Posterior;Left Stage 2 -  Partial thickness loss of dermis presenting as a shallow open injury with a red, pink wound bed without slough. Red, open (Active)  04/11/2021 2300  Location: Elbow  Location Orientation: Posterior;Left  Staging: Stage 2 -  Partial thickness loss of dermis presenting as a shallow open injury with a red, pink wound bed without slough.  Wound Description (Comments): Red, open  Present on Admission: Yes    Nutritional Assessment: Body mass index is 21.48 kg/m.Marland Kitchen Seen by dietician.  I agree with the assessment and plan as outlined below: Nutrition Status: Nutrition Problem: Inadequate oral intake Etiology: nausea, vomiting Skin Assessment: I have examined the patient's skin and I agree with the wound assessment as performed by the wound care RN as outlined below: Pressure Injury 04/23/2021 Elbow Posterior;Left Stage 2 -  Partial thickness loss of dermis presenting as a shallow open injury with a red, pink wound bed without slough. Red, open (Active)  05/06/2021 2300  Location: Elbow  Location Orientation: Posterior;Left  Staging: Stage 2 -  Partial thickness loss of dermis presenting as a shallow open injury with a red, pink wound bed without slough.  Wound Description (Comments): Red, open  Present on Admission: Yes    Consultants:  GI General surgery  Procedures:  None  Antimicrobials:  Anti-infectives (From  admission, onward)    None          Subjective:  Patient seen and examined.  She told me that she had vomited last night and currently she has some nausea with heartburn.  She is not sure if she is passing gas.  I will transition her from oral to IV Protonix.  Objective: Vitals:   05/03/21 2332 05/04/21 0406 05/04/21 0810 05/04/21 1129  BP: 134/70 132/71 135/69 126/78  Pulse: 88 87 96 94  Resp: 20 19 20 20   Temp: 97.7 F (36.5 C) 97.7 F (36.5 C) 98.1 F (36.7 C) 97.9 F (36.6 C)  TempSrc: Oral Oral Oral Oral  SpO2: 98% 98% 92% 92%  Weight:  55 kg    Height:        Intake/Output Summary (Last 24 hours) at 05/04/2021 1157 Last data filed at 05/04/2021 0500 Gross per 24 hour  Intake 1700 ml  Output --  Net 1700 ml    Filed Weights   04/23/2021 0827 05/02/21 0305 05/04/21 0406  Weight: 56.3 kg 56.6 kg 55 kg    Examination:  General exam: Appears calm and comfortable  Respiratory system: Clear to auscultation. Respiratory effort normal. Cardiovascular system: S1 & S2 heard, RRR. No JVD, murmurs, rubs, gallops or clicks. No pedal edema. Gastrointestinal system: Abdomen is slightly distended but  soft and mild epigastric tenderness with positive fluid shift. No organomegaly or masses felt. Normal bowel sounds heard. Central nervous system: Alert and oriented. No focal neurological deficits. Extremities: Symmetric 5 x 5 power. Skin: No rashes, lesions or ulcers.  Psychiatry: Judgement and insight appear normal. Mood & affect flat.  Data Reviewed: I have personally reviewed following labs and imaging studies  CBC: Recent Labs  Lab 04/17/2021 1400 04/30/21 0120 04/07/2021 1104 05/02/21 0210 05/03/21 0132 05/04/21 0036  WBC 11.6* 9.7 14.9* 15.7* 10.5 16.3*  NEUTROABS 9.6*  --  12.7* 13.3*  --   --   HGB 15.0 13.1 16.7* 16.3* 13.9 16.5*  HCT 47.8* 41.7 53.7* 50.7* 43.1 52.7*  MCV 88.2 87.6 88.3 86.7 86.9 88.0  PLT 342 280 324 351 258 415*    Basic Metabolic  Panel: Recent Labs  Lab 04/30/21 0120 04/17/2021 1104 05/02/21 0210 05/03/21 0132 05/04/21 0036  NA 142 143 138 138 138  K 3.8 4.6 5.3* 4.0 5.2*  CL 109 109 105 108 108  CO2 26 22 23 23  18*  GLUCOSE 94 108* 147* 108* 134*  BUN 40* 39* 42* 39* 38*  CREATININE 1.34* 1.53* 1.70* 1.50* 1.50*  CALCIUM 9.3 9.0 9.3 8.3* 8.6*    GFR: Estimated Creatinine Clearance: 20.6 mL/min (A) (by C-G formula based on SCr of 1.5 mg/dL (H)). Liver Function Tests: Recent Labs  Lab 04/28/2021 1400 04/30/21 0120  AST 27 25  ALT 23 23  ALKPHOS 51 46  BILITOT 1.0 0.9  PROT 5.2* 4.5*  ALBUMIN 2.9* 2.4*    Recent Labs  Lab 04/26/2021 1400  LIPASE 32    No results for input(s): AMMONIA in the last 168 hours. Coagulation Profile: Recent Labs  Lab 04/30/2021 1722 05/03/21 1256  INR 1.2 1.1    Cardiac Enzymes: No results for input(s): CKTOTAL, CKMB, CKMBINDEX, TROPONINI in the last 168 hours. BNP (last 3 results) No results for input(s): PROBNP in the last 8760 hours. HbA1C: No results for input(s): HGBA1C in the last 72 hours. CBG: No results for input(s): GLUCAP in the last 168 hours. Lipid Profile: No results for input(s): CHOL, HDL, LDLCALC, TRIG, CHOLHDL, LDLDIRECT in the last 72 hours. Thyroid Function Tests: No results for input(s): TSH, T4TOTAL, FREET4, T3FREE, THYROIDAB in the last 72 hours. Anemia Panel: No results for input(s): VITAMINB12, FOLATE, FERRITIN, TIBC, IRON, RETICCTPCT in the last 72 hours. Sepsis Labs: No results for input(s): PROCALCITON, LATICACIDVEN in the last 168 hours.  Recent Results (from the past 240 hour(s))  Resp Panel by RT-PCR (Flu A&B, Covid) Nasopharyngeal Swab     Status: None   Collection Time: 04/16/2021  2:55 PM   Specimen: Nasopharyngeal Swab; Nasopharyngeal(NP) swabs in vial transport medium  Result Value Ref Range Status   SARS Coronavirus 2 by RT PCR NEGATIVE NEGATIVE Final    Comment: (NOTE) SARS-CoV-2 target nucleic acids are NOT  DETECTED.  The SARS-CoV-2 RNA is generally detectable in upper respiratory specimens during the acute phase of infection. The lowest concentration of SARS-CoV-2 viral copies this assay can detect is 138 copies/mL. A negative result does not preclude SARS-Cov-2 infection and should not be used as the sole basis for treatment or other patient management decisions. A negative result may occur with  improper specimen collection/handling, submission of specimen other than nasopharyngeal swab, presence of viral mutation(s) within the areas targeted by this assay, and inadequate number of viral copies(<138 copies/mL). A negative result must be combined with clinical observations, patient history, and epidemiological  information. The expected result is Negative.  Fact Sheet for Patients:  EntrepreneurPulse.com.au  Fact Sheet for Healthcare Providers:  IncredibleEmployment.be  This test is no t yet approved or cleared by the [place] FDA and  has been authorized for detection and/or diagnosis of SARS-CoV-2 by FDA under an Emergency Use Authorization (EUA). This EUA will remain  in effect (meaning this test can be used) for the duration of the COVID-19 declaration under Section 564(b)(1) of the Act, 21 U.S.C.section 360bbb-3(b)(1), unless the authorization is terminated  or revoked sooner.       Influenza A by PCR NEGATIVE NEGATIVE Final   Influenza B by PCR NEGATIVE NEGATIVE Final    Comment: (NOTE) The Xpert Xpress SARS-CoV-2/FLU/RSV plus assay is intended as an aid in the diagnosis of influenza from Nasopharyngeal swab specimens and should not be used as a sole basis for treatment. Nasal washings and aspirates are unacceptable for Xpert Xpress SARS-CoV-2/FLU/RSV testing.  Fact Sheet for Patients: EntrepreneurPulse.com.au  Fact Sheet for Healthcare Providers: IncredibleEmployment.be  This test is not yet  approved or cleared by the [place] FDA and has been authorized for detection and/or diagnosis of SARS-CoV-2 by FDA under an Emergency Use Authorization (EUA). This EUA will remain in effect (meaning this test can be used) for the duration of the COVID-19 declaration under Section 564(b)(1) of the Act, 21 U.S.C. section 360bbb-3(b)(1), unless the authorization is terminated or revoked.  Performed at [facility], [place] [address], [place], [place] [address]   Aerobic/Anaerobic Culture w Gram Stain (surgical/deep wound)     Status: None (Preliminary result)   Collection Time: 04/30/21 11:23 AM   Specimen: PATH Cytology Peritoneal fluid  Result Value Ref Range Status   Specimen Description PERITONEAL FLUID  Final   Special Requests NONE  Final   Gram Stain   Final    RARE WBC PRESENT,BOTH PMN AND MONONUCLEAR NO ORGANISMS SEEN    Culture   Final    NO GROWTH 4 DAYS NO ANAEROBES ISOLATED; CULTURE IN PROGRESS FOR 5 DAYS Performed at [facility], [place] [address], [place], [place] [address]    Report Status PENDING  Incomplete  Surgical PCR screen     Status: None   Collection Time: 04/30/21  8:54 PM   Specimen: Nasal Mucosa; Nasal Swab  Result Value Ref Range Status   MRSA, PCR NEGATIVE NEGATIVE Final   Staphylococcus aureus NEGATIVE NEGATIVE Final    Comment: (NOTE) The Xpert SA Assay (FDA approved for NASAL specimens in patients 41 years of age and older), is one component of a comprehensive surveillance program. It is not intended to diagnose infection nor to guide or monitor treatment. Performed at [facility], [place] [address] [place],  [address]        Radiology Studies: DG Abd 1 View  Result Date: 05/04/2021 CLINICAL DATA:  Clinical diagnosis of small-bowel obstruction. EXAM: ABDOMEN - 1 VIEW COMPARISON:  Abdomen and pelvis CT dated 05/05/2021 FINDINGS: Centralized bowel loops compatible with previously demonstrated large volume ascites.  Mildly dilated loops of small bowel in the left mid abdomen. Gas in normal caliber rectum. No gross free peritoneal air. Lumbar spine degenerative changes. IMPRESSION: 1. Mild partial small bowel obstruction or ileus. 2. Recently demonstrated ascites. Electronically Signed   By: Claudie Revering M.D.   On: 05/04/2021 09:55    Scheduled Meds:  cholecalciferol  2,000 Units Oral BID   enoxaparin (LOVENOX) injection  60 mg Subcutaneous Q24H  feeding supplement  237 mL Oral BID BM   mupirocin ointment  1 application Nasal BID   pantoprazole  40 mg Oral Daily   vitamin B-12  500 mcg Oral Daily   Continuous Infusions:  sodium chloride 100 mL/hr at 05/04/21 0500     LOS: 4 days   Time spent: 28 minutes   Darliss Cheney, MD [facility]  05/04/2021, 11:57 AM  Please page via Shea Evans and do not message via secure chat for anything urgent. Secure chat can be used for anything non urgent.  How to contact the [facility] Attending or Consulting provider [place] or covering provider during after hours [place], for this patient?  Check the care team in [facility] and look for a) attending/consulting [facility] provider listed and b) the [facility] team listed. Page or secure chat [phone]. Log into [email or web address] and use [place]'s universal password to access. If you do not have the password, please contact the hospital operator. Locate the [facility] provider you are looking for under [facility] and page to a number that you can be directly reached. If you still have difficulty reaching the provider, please page the [facility] (Director on Call) for the Hospitalists listed on amion for assistance.

## 2021-05-05 ENCOUNTER — Inpatient Hospital Stay (HOSPITAL_COMMUNITY): Payer: Medicare Other

## 2021-05-05 DIAGNOSIS — R627 Adult failure to thrive: Secondary | ICD-10-CM | POA: Diagnosis not present

## 2021-05-05 LAB — CBC
HCT: 50.1 % — ABNORMAL HIGH (ref 36.0–46.0)
Hemoglobin: 15.8 g/dL — ABNORMAL HIGH (ref 12.0–15.0)
MCH: 27.4 pg (ref 26.0–34.0)
MCHC: 31.5 g/dL (ref 30.0–36.0)
MCV: 87 fL (ref 80.0–100.0)
Platelets: 357 10*3/uL (ref 150–400)
RBC: 5.76 MIL/uL — ABNORMAL HIGH (ref 3.87–5.11)
RDW: 14.5 % (ref 11.5–15.5)
WBC: 13.9 10*3/uL — ABNORMAL HIGH (ref 4.0–10.5)
nRBC: 0 % (ref 0.0–0.2)

## 2021-05-05 LAB — BASIC METABOLIC PANEL
Anion gap: 11 (ref 5–15)
BUN: 48 mg/dL — ABNORMAL HIGH (ref 8–23)
CO2: 19 mmol/L — ABNORMAL LOW (ref 22–32)
Calcium: 8.7 mg/dL — ABNORMAL LOW (ref 8.9–10.3)
Chloride: 114 mmol/L — ABNORMAL HIGH (ref 98–111)
Creatinine, Ser: 2.14 mg/dL — ABNORMAL HIGH (ref 0.44–1.00)
GFR, Estimated: 21 mL/min — ABNORMAL LOW (ref >60)
Glucose, Bld: 113 mg/dL — ABNORMAL HIGH (ref 70–99)
Potassium: 4.5 mmol/L (ref 3.5–5.1)
Sodium: 144 mmol/L (ref 135–145)

## 2021-05-05 LAB — AEROBIC/ANAEROBIC CULTURE W GRAM STAIN (SURGICAL/DEEP WOUND): Culture: NO GROWTH

## 2021-05-05 MED ORDER — ENOXAPARIN SODIUM 60 MG/0.6ML IJ SOSY
55.0000 mg | PREFILLED_SYRINGE | INTRAMUSCULAR | Status: DC
  Administered 2021-05-05 – 2021-05-08 (×4): 55 mg via SUBCUTANEOUS
  Filled 2021-05-05 (×3): qty 0.6

## 2021-05-05 NOTE — Consult Note (Addendum)
Consult Note  Hannah Stein 1929-04-20  [id number].    Requesting MD: Dr. Clarene Essex Chief Complaint/Reason for Consult: Sigmoid colon mass HPI:  Patient is a 87 year old female who presented to the hospital 05/05/2021 with abdominal pain, n/v and poor PO intake with >10lb weight loss. She reported epigastric abdominal pain since October. Last colonoscopy was around age 18-80 and was normal except for hemorrhoids. CT AP showed circumferential thickening of sigmoid colon and GI was consulted for colonoscopy which was done 11/25. Pathology was negative for malignancy. Patient continues to have intolerance of PO liquids due to abdominal discomfort, nausea, vomiting, and poor appetite ("nothing tastes good"). She is having minimal flatus and has some liquid stools. Denies a solid, formed stool since October. Prior to October the patient states she was fully independent. She did not use an assistive device for walking. She states someone mows her lawn for her and her daughter gets her weekly groceries, but otherwise she is fully independent.  Former smoker who quit >30 years ago. Denies heavy alcohol use. Patient has a remote history of RLE DVT and of paroxysmal afib on xarelto. She also has a history of COPD, not on home oxygen, cirrhosis w/ ascites, and CKD III. She denies a history of abdominal surgery.  ROS: Review of Systems  Constitutional:  Positive for malaise/fatigue and weight loss. Negative for chills and fever.  HENT: Negative.    Eyes: Negative.   Cardiovascular:  Positive for leg swelling.  Skin: Negative.    Family History  Problem Relation Age of Onset   Heart failure Mother    CVA Mother        "mini strokes"    Past Medical History:  Diagnosis Date   DVT, lower extremity ([facility])     Past Surgical History:  Procedure Laterality Date   BIOPSY  04/16/2021   Procedure: BIOPSY;  Surgeon: Otis Brace, MD;  Location: [facility];  Service: Gastroenterology;;    COLONOSCOPY WITH PROPOFOL N/A 04/26/2021   Procedure: COLONOSCOPY WITH PROPOFOL;  Surgeon: Otis Brace, MD;  Location: [facility];  Service: Gastroenterology;  Laterality: N/A;   SUBMUCOSAL TATTOO INJECTION  04/08/2021   Procedure: SUBMUCOSAL TATTOO INJECTION;  Surgeon: Otis Brace, MD;  Location: [facility];  Service: Gastroenterology;;   THROAT SURGERY      Social History:  reports that she quit smoking about 38 years ago. Her smoking use included cigarettes. She has never used smokeless tobacco. She reports that she does not drink alcohol and does not use drugs.  Allergies: No Known Allergies  Medications Prior to Admission  Medication Sig Dispense Refill   Cholecalciferol (VITAMIN D) 50 MCG (2000 UT) CAPS Take 1 capsule by mouth 2 (two) times daily.     Rivaroxaban (XARELTO) 15 MG TABS tablet Take 1 tablet (15 mg total) by mouth daily with supper. 90 tablet 3   vitamin B-12 (CYANOCOBALAMIN) 500 MCG tablet Take 500 mcg by mouth daily.      Blood pressure 138/62, pulse 92, temperature 97.6 F (36.4 C), temperature source Oral, resp. rate 16, height 5\' 3"  (1.6 m), weight 54.6 kg, SpO2 96 %. Physical Exam:  General: pleasant, ill-appearing female who is laying in bed in NAD HEENT: head is normocephalic, atraumatic.  Sclera are noninjected.  PERRL.  Ears and nose without any masses or lesions.  Mouth is pink and moist Heart: regular, rate, and rhythm.  Normal s1,s2. No obvious murmurs, gallops, or rubs noted.  Palpable radial  and pedal pulses bilaterally Lungs: CTAB, slightly diminished bilateral lung bases, slightly labored respirations on nasal cannula Abd: soft, moderately distended, overall non-tender, +BS, no masses, hernias, or organomegaly MS: all 4 extremities are symmetrical with no cyanosis, clubbing, or edema. Skin: warm and dry with no masses, lesions, or rashes Neuro: Cranial nerves 2-12 grossly intact, sensation is normal throughout Psych: A&Ox3 with an  appropriate affect.   Results for orders placed or performed during the hospital encounter of 05/06/2021 (from the past 48 hour(s))  CBC     Status: Abnormal   Collection Time: 05/04/21 12:36 AM  Result Value Ref Range   WBC 16.3 (H) 4.0 - 10.5 K/uL   RBC 5.99 (H) 3.87 - 5.11 MIL/uL   Hemoglobin 16.5 (H) 12.0 - 15.0 g/dL   HCT 52.7 (H) 36.0 - 46.0 %   MCV 88.0 80.0 - 100.0 fL   MCH 27.5 26.0 - 34.0 pg   MCHC 31.3 30.0 - 36.0 g/dL   RDW 14.6 11.5 - 15.5 %   Platelets 415 (H) 150 - 400 K/uL   nRBC 0.0 0.0 - 0.2 %    Comment: Performed at [facility], [place] [address], [place], [place] [address]  Basic metabolic panel     Status: Abnormal   Collection Time: 05/04/21 12:36 AM  Result Value Ref Range   Sodium 138 135 - 145 mmol/L   Potassium 5.2 (H) 3.5 - 5.1 mmol/L   Chloride 108 98 - 111 mmol/L   CO2 18 (L) 22 - 32 mmol/L   Glucose, Bld 134 (H) 70 - 99 mg/dL    Comment: Glucose reference range applies only to samples taken after fasting for at least 8 hours.   BUN 38 (H) 8 - 23 mg/dL   Creatinine, Ser 1.50 (H) 0.44 - 1.00 mg/dL   Calcium 8.6 (L) 8.9 - 10.3 mg/dL   GFR, Estimated 33 (L) >60 mL/min    Comment: (NOTE) Calculated using the CKD-EPI Creatinine Equation (2021)    Anion gap 12 5 - 15    Comment: Performed at [facility] [address], [place], [place] [address]  Potassium     Status: None   Collection Time: 05/04/21  1:27 PM  Result Value Ref Range   Potassium 4.5 3.5 - 5.1 mmol/L    Comment: Performed at [facility] [address], [place], [place] [address]  CBC     Status: Abnormal   Collection Time: 05/05/21  2:02 AM  Result Value Ref Range   WBC 13.9 (H) 4.0 - 10.5 K/uL   RBC 5.76 (H) 3.87 - 5.11 MIL/uL   Hemoglobin 15.8 (H) 12.0 - 15.0 g/dL   HCT 50.1 (H) 36.0 - 46.0 %   MCV 87.0 80.0 - 100.0 fL   MCH 27.4 26.0 - 34.0 pg   MCHC 31.5 30.0 - 36.0 g/dL   RDW 14.5 11.5 - 15.5 %   Platelets 357 150 - 400 K/uL   nRBC 0.0 0.0 - 0.2 %     Comment: Performed at [facility], [place] [address], [place], [place] [address]  Basic metabolic panel     Status: Abnormal   Collection Time: 05/05/21  2:02 AM  Result Value Ref Range   Sodium 144 135 - 145 mmol/L   Potassium 4.5 3.5 - 5.1 mmol/L   Chloride 114 (H) 98 - 111 mmol/L   CO2 19 (L) 22 - 32 mmol/L   Glucose, Bld 113 (H)  70 - 99 mg/dL    Comment: Glucose reference range applies only to samples taken after fasting for at least 8 hours.   BUN 48 (H) 8 - 23 mg/dL   Creatinine, Ser 2.14 (H) 0.44 - 1.00 mg/dL   Calcium 8.7 (L) 8.9 - 10.3 mg/dL   GFR, Estimated 21 (L) >60 mL/min    Comment: (NOTE) Calculated using the CKD-EPI Creatinine Equation (2021)    Anion gap 11 5 - 15    Comment: Performed at [facility] [address], [place], [place] [address]   DG Abd 1 View  Result Date: 05/04/2021 CLINICAL DATA:  Clinical diagnosis of small-bowel obstruction. EXAM: ABDOMEN - 1 VIEW COMPARISON:  Abdomen and pelvis CT dated 04/28/2021 FINDINGS: Centralized bowel loops compatible with previously demonstrated large volume ascites. Mildly dilated loops of small bowel in the left mid abdomen. Gas in normal caliber rectum. No gross free peritoneal air. Lumbar spine degenerative changes. IMPRESSION: 1. Mild partial small bowel obstruction or ileus. 2. Recently demonstrated ascites. Electronically Signed   By: Claudie Revering M.D.   On: 05/04/2021 09:55   DG CHEST PORT 1 VIEW  Result Date: 05/05/2021 CLINICAL DATA:  Pneumothorax EXAM: PORTABLE CHEST 1 VIEW COMPARISON:  05/04/2021 FINDINGS: Minimal residual right apical pneumothorax, less than 5% volume, not significantly changed compared to prior. Severe emphysema. No acute airspace opacity. Heart and mediastinum are normal. IMPRESSION: 1. Minimal residual right apical pneumothorax, less than 5% volume, not significantly changed compared to prior. 2. Severe emphysema. Electronically Signed   By: Delanna Ahmadi M.D.   On: 05/05/2021 08:10    DG CHEST PORT 1 VIEW  Result Date: 05/04/2021 CLINICAL DATA:  Evaluate pneumothorax. EXAM: PORTABLE CHEST 1 VIEW COMPARISON:  CT chest 05/02/2021. FINDINGS: Persistent right-sided pneumothorax is identified with creasing right apical component which measures 1.4 cm in maximum thickness. This is compared with 1 cm when measured off the chest CT from 05/02/2021. There is a long linear lucency which extends along the periphery of the right lung which is felt to represent skin fold artifact. The lungs are hyperinflated and there are diffuse interstitial changes compatible with COPD/emphysema. IMPRESSION: Persistent right-sided pneumothorax with mild increase in right apical component. These results will be called to the ordering clinician or representative by the Radiologist Assistant, and communication documented in the PACS or [employer]. Electronically Signed   By: Kerby Moors M.D.   On: 05/04/2021 12:37      Assessment/Plan Sigmoid colon mass, partially obstructing  - s/p colonoscopy 11/25, biopsy taken and path negative for malignancy but unclear inflammatory tissue  - CT with abnormal circumferential thickening of wall of sigmoid colon - WBC 13.9, hgb 15.8 - Due to malnutrition/failure to thrive, age, and multiple medical problems, the patient is high risk for perioperative complications and mortality (see below). Her surgical options would include laparoscopic assisted partial colectomy vs palliative diverting loop colostomy. With no evidence of malignancy, I have asked GI if they would consider a colonic stent, this my alleviate obstructive symptoms, at least temporarily, and help the patients nutritional status improve. Agree with palliative care consult for goals of care.   ?pSBO - low suspicion for this based on KUB from 11/28. Given no prior abdominal surgeries, hernias, or issues with pSBO in the past, its more likely her nausea/vomiting/distention are a result of known colonic  stricture.   Cirrhosis with ascites  - s/p paracentesis on 11/24, large volume  - CHILD B suggestive of 30% peri-op  mortality risk, MELD score of 11 which suggests 6% 3 month mortality   FEN: CLD, IVF @100  cc/h VTE: LMWH ID: no current abx  FTT Paroxysmal A.Fib - xarelto on hold  AKI Small R PTX AAA 2.9 cm Elevated troponin  Hx of HTN   Obie Dredge, [facility] Surgery 05/05/2021, 1:34 PM Please see Amion for pager number during day hours 7:00am-4:30pm

## 2021-05-05 NOTE — Progress Notes (Signed)
Path reviewed may want to we asked surgery to consider laparoscope and possible biopsy or ask IR to see if any lesion is amenable to CT directed biopsy otherwise might need to consider repeat flex sig for biopsy although that may yield the same results

## 2021-05-05 NOTE — Progress Notes (Signed)
Occupational Therapy Treatment Patient Details Name: Hannah Stein MRN: [id number] DOB: April 28, 1929 Today's Date: 05/05/2021   History of present illness Pt is a 87 y/o female admitted 11/23 with abdominal pain, N/V and poor PO intake with weight loss.  CT pelvis showing a signoid colon mass measuring 8.2 x 3.4 cm.  PMHx: PAF, COPD   OT comments  Patient received in bed and stated she did not feel well but was willing to get up into recliner. Patient required assistance to get up to EOB due to weakness. Patient performed transfer to [facility] with RW and required increased time for use and min assist for hygiene while standing. Patient declined further ADLs but transferred to recliner to eat breakfast. Acute OT to continue to follow.   Recommendations for follow up therapy are one component of a multi-disciplinary discharge planning process, led by the attending physician.  Recommendations may be updated based on patient status, additional functional criteria and insurance authorization.    Follow Up Recommendations  Skilled nursing-short term rehab (<3 hours/day)    Assistance Recommended at Discharge Frequent or constant Supervision/Assistance  Equipment Recommendations  BSC/3in1    Recommendations for Other Services      Precautions / Restrictions Precautions Precautions: Fall       Mobility Bed Mobility Overal bed mobility: Needs Assistance Bed Mobility: Supine to Sit     Supine to sit: Min assist     General bed mobility comments: cues to use rail and bed pad used to assist with hips    Transfers Overall transfer level: Needs assistance Equipment used: Rolling walker (2 wheels) Transfers: Sit to/from Stand Sit to Stand: Min assist           General transfer comment: cues to push up to stand and to reach back to sit     Balance Overall balance assessment: Needs assistance Sitting-balance support: No upper extremity supported Sitting balance-Leahy Scale:  Good Sitting balance - Comments: able to sit on EOB without assitance   Standing balance support: During functional activity;Bilateral upper extremity supported;Reliant on assistive device for balance Standing balance-Leahy Scale: Poor Standing balance comment: using RW                           ADL either performed or assessed with clinical judgement   ADL Overall ADL's : Needs assistance/impaired                         Toilet Transfer: Minimal assistance;Ambulation;Rolling walker (2 wheels) Toilet Transfer Details (indicate cue type and reason): transferred from EOB to [place] and Hygiene: Minimal assistance;Sit to/from stand Toileting - Clothing Manipulation Details (indicate cue type and reason): required assistance for balance     Functional mobility during ADLs: Minimal assistance;Rolling walker (2 wheels) General ADL Comments: poor activity tolerance due to not feeling well    Extremity/Trunk Assessment              Vision       Perception     Praxis      Cognition Arousal/Alertness: Awake/alert Behavior During Therapy: WFL for tasks assessed/performed Overall Cognitive Status: Within Functional Limits for tasks assessed                                 General Comments: Patient not feeling well, stated she felt nauseous but no vomiting  Exercises     Shoulder Instructions       General Comments nursing notified on patient's nausea vital signs WNL    Pertinent Vitals/ Pain       Pain Assessment: Faces Faces Pain Scale: Hurts little more Pain Location: stomach Pain Descriptors / Indicators: Cramping;Burning;Grimacing Pain Intervention(s): Repositioned;Monitored during session;Limited activity within patient's tolerance  Home Living                                          Prior Functioning/Environment              Frequency  Min 2X/week         Progress Toward Goals  OT Goals(current goals can now be found in the care plan section)  Progress towards OT goals: Progressing toward goals  Acute Rehab OT Goals OT Goal Formulation: With patient/family Time For Goal Achievement: 05/15/21 Potential to Achieve Goals: Good ADL Goals Pt Will Perform Grooming: with supervision;standing Pt Will Perform Lower Body Bathing: with supervision;sit to/from stand Pt Will Perform Lower Body Dressing: with supervision;sit to/from stand Pt Will Transfer to Toilet: with supervision;ambulating;bedside commode Pt Will Perform Toileting - Clothing Manipulation and hygiene: with supervision;sit to/from stand Additional ADL Goal #1: Pt will utilize energy conservation strategies during ADL and mobility.  Plan Discharge plan remains appropriate    Co-evaluation                 AM-PAC OT "6 Clicks" Daily Activity     Outcome Measure   Help from another person eating meals?: None Help from another person taking care of personal grooming?: A Little Help from another person toileting, which includes using toliet, bedpan, or urinal?: A Little Help from another person bathing (including washing, rinsing, drying)?: A Little Help from another person to put on and taking off regular upper body clothing?: A Little Help from another person to put on and taking off regular lower body clothing?: A Little 6 Click Score: 19    End of Session Equipment Utilized During Treatment: Rolling walker (2 wheels);Gait belt  OT Visit Diagnosis: Unsteadiness on feet (R26.81);Other abnormalities of gait and mobility (R26.89);Muscle weakness (generalized) (M62.81);Other (comment)   Activity Tolerance Patient limited by fatigue   Patient Left in chair;with call bell/phone within reach;with chair alarm set;with nursing/sitter in room   Nurse Communication Mobility status;Other (comment) (patient's nausea and stomach pain)        Time: 5003-7048 OT Time  Calculation (min): 35 min  Charges: OT General Charges $OT Visit: 1 Visit OT Treatments $Self Care/Home Management : 23-37 mins  Lodema Hong, [place]  Pager [phone] Office [place] 05/05/2021, 9:42 AM

## 2021-05-05 NOTE — Progress Notes (Signed)
[facility] Gastroenterology Progress Note  Hannah Stein 87 y.o. Feb 13, 1929  CC:  Abdominal pain   Subjective: Patient is still having intermittent midline abdominal pain. Unsure what makes it better or worse. She is having nausea with swallowing, specifically pills. Also having nausea with clear liquids, no vomiting. Has not had a BM today.  ROS : Review of Systems  Eyes:  Positive for discharge. Negative for pain.  Gastrointestinal:  Positive for abdominal pain and nausea. Negative for blood in stool, constipation, diarrhea, heartburn, melena and vomiting.  Genitourinary:  Negative for dysuria and urgency.     Objective: Vital signs in last 24 hours: Vitals:   05/05/21 0826 05/05/21 1119  BP:  138/62  Pulse:  92  Resp:  16  Temp:  97.6 F (36.4 C)  SpO2: 97% 96%    Physical Exam:  General:  Alert, cooperative, no distress, appears stated age  Head:  Normocephalic, without obvious abnormality, atraumatic  Eyes:  Anicteric sclera, EOM's intact, yellow discharge bilaterally  Lungs:   Clear to auscultation bilaterally, respirations unlabored  Heart:  Regular rate and rhythm, S1, S2 normal  Abdomen:   Abdomen distended, soft, nontender. Sluggish bowel sounds.    Lab Results: Recent Labs    05/04/21 0036 05/04/21 1327 05/05/21 0202  NA 138  --  144  K 5.2* 4.5 4.5  CL 108  --  114*  CO2 18*  --  19*  GLUCOSE 134*  --  113*  BUN 38*  --  48*  CREATININE 1.50*  --  2.14*  CALCIUM 8.6*  --  8.7*   No results for input(s): AST, ALT, ALKPHOS, BILITOT, PROT, ALBUMIN in the last 72 hours. Recent Labs    05/04/21 0036 05/05/21 0202  WBC 16.3* 13.9*  HGB 16.5* 15.8*  HCT 52.7* 50.1*  MCV 88.0 87.0  PLT 415* 357   Recent Labs    05/03/21 1256  LABPROT 14.7  INR 1.1      Assessment Abdominal pain; sigmoid colon mass with possible cancer - CT ab/pelvis: abnormal wall thickening in sigmoid colon. Possible circumferential mass in sigmoid colon measuring  approximately 8.2x 3.4cm - Colonoscopy 11/25: partially obstructing sigmoid mass, biopsy taken, pathology negative for malignancy - WBC 13.9 (down trending) - HGB 15.8 (down trending)   Cirrhosis with ascites: - s/p diagnostic and therapeutic paracentesis 11/24: with retrieval of 4 L fluid. Cytology negative for malignancy.   Partial small bowel obstruction - Abdominal xray 11/28: mild partial small bowel obstruction or ileus, ascites - Awaiting surgical consult.  Failure to thrive  AKI  Paroxysmal atrial fibrillation - still holding Xarelto, on lovenox currently.  Plan: Awaiting surgical consult for further management. Appreciate their input, pending their evaluation if they do not feel laproscopic bx is advised can consider consulting IR for biopsy If IR feels lesion is not amenable for CT direct biopsy, will consider repeat flex sigmoidoscopy and repeat biopsy although previous biopsy via endoscopy was unrevealing. Can continue clear liquids as tolerating [facility] will follow.  Dulce Martian Radford Pax PA-C 05/05/2021, 11:35 AM  Contact #  [phone]

## 2021-05-05 NOTE — Progress Notes (Signed)
Physical Therapy Treatment Patient Details Name: Hannah Stein MRN: [id number] DOB: 1929-03-16 Today's Date: 05/05/2021   History of Present Illness Pt is a 87 y/o female admitted 04/08/2021 with abdominal pain, N/V and poor PO intake with weight loss.  CT pelvis showing a signoid colon mass measuring 8.2 x 3.4 cm. Underwent paracentesis on 11/24. Colonoscopy on 11/25 showed partially obstructive sigmoid mass - negative for malignancy, awaiting surgical consult.  PMH: PAF, COPD.    PT Comments    Pt is slowly progressing with mobility. Pt was tired today after OT, but still motivated to participate. Today's session focused on ambulation to increase activity tolerance. Pt continues to be limited by fatigue, poor balance, decreased strength, decreased functional mobility and decreased activity tolerance. Pt needs physical assist for bed mobility, transfers, and short bouts of ambulation. Therefore, continue to recommend SNF as pt lives alone. Will continue to follow acutely to address short-term PT goals.   Recommendations for follow up therapy are one component of a multi-disciplinary discharge planning process, led by the attending physician.  Recommendations may be updated based on patient status, additional functional criteria and insurance authorization.  Follow Up Recommendations  Skilled nursing-short term rehab (<3 hours/day)     Assistance Recommended at Discharge Frequent or constant Supervision/Assistance  Equipment Recommendations  Rolling walker (2 wheels)    Recommendations for Other Services       Precautions / Restrictions Precautions Precautions: Fall Restrictions Weight Bearing Restrictions: No     Mobility  Bed Mobility Overal bed mobility: Needs Assistance Bed Mobility: Supine to Sit;Sit to Supine     Supine to sit: Mod assist Sit to supine: Min assist   General bed mobility comments: When asked to sit up pt stated "you are going to have to help me." Mod A  for trunk elevation and LE advancement during supine to sit; cues to use bedrail. Min A for BLE during sit to supine.    Transfers Overall transfer level: Needs assistance Equipment used: Rolling walker (2 wheels) Transfers: Sit to/from Stand Sit to Stand: Min assist           General transfer comment: Stood 3 times from EOB, Min A for elevation, cues for UE placement with RW and to use UE to push to stand; took increased time. Cues to turn all the way around, make sure legs are touching bed before, and to stay inside RW when sitting.    Ambulation/Gait Ambulation/Gait assistance: Min assist Gait Distance (Feet): 32 Feet (+34ft) Assistive device: Rolling walker (2 wheels) Gait Pattern/deviations: Step-through pattern;Decreased stride length Gait velocity: decreased     General Gait Details: Pt requested to stay in room today. Min A to steady; 1 sitting rest break due to fatigue. Cues for pursed lip breathing during rest break.   Stairs             Wheelchair Mobility    Modified Rankin (Stroke Patients Only)       Balance Overall balance assessment: Needs assistance Sitting-balance support: No upper extremity supported Sitting balance-Leahy Scale: Fair Sitting balance - Comments: able to sit on EOB without assistance   Standing balance support: During functional activity;Bilateral upper extremity supported;Reliant on assistive device for balance Standing balance-Leahy Scale: Poor Standing balance comment: uses RW for static standing and ambulation                            Cognition Arousal/Alertness: Awake/alert Behavior During Therapy: [facility]  for tasks assessed/performed Overall Cognitive Status: Within Functional Limits for tasks assessed                                 General Comments: Pt stated she was tired today but agreeable to therapy with a little encouragement        Exercises      General Comments General comments  (skin integrity, edema, etc.): 2L O2 throughout session; SpO2 down to 88% during ambulation; quickly recovered to 94% after seated rest and pursed lip breathing      Pertinent Vitals/Pain Pain Assessment: No/denies pain    Home Living                          Prior Function            PT Goals (current goals can now be found in the care plan section) Acute Rehab PT Goals Patient Stated Goal: to get stronger PT Goal Formulation: With patient Time For Goal Achievement: 05/14/21 Potential to Achieve Goals: Fair Progress towards PT goals: Progressing toward goals    Frequency    Min 2X/week      PT Plan Current plan remains appropriate    Co-evaluation              AM-PAC PT "6 Clicks" Mobility   Outcome Measure  Help needed turning from your back to your side while in a flat bed without using bedrails?: A Little Help needed moving from lying on your back to sitting on the side of a flat bed without using bedrails?: A Lot Help needed moving to and from a bed to a chair (including a wheelchair)?: A Little Help needed standing up from a chair using your arms (e.g., wheelchair or bedside chair)?: A Little Help needed to walk in hospital room?: A Little Help needed climbing 3-5 steps with a railing? : A Lot 6 Click Score: 16    End of Session Equipment Utilized During Treatment: Gait belt;Oxygen Activity Tolerance: Patient limited by fatigue Patient left: in bed;with call bell/phone within reach;with bed alarm set Nurse Communication: Mobility status PT Visit Diagnosis: Unsteadiness on feet (R26.81);Other abnormalities of gait and mobility (R26.89);Difficulty in walking, not elsewhere classified (R26.2);Muscle weakness (generalized) (M62.81)     Time: 0737-1062 PT Time Calculation (min) (ACUTE ONLY): 27 min  Charges:  $Therapeutic Exercise: 8-22 mins $Therapeutic Activity: 8-22 mins                     Brandon Melnick, SPT   Brandon Melnick 05/05/2021, 1:00 PM

## 2021-05-05 NOTE — Progress Notes (Signed)
ANTICOAGULATION CONSULT NOTE - Follow Up Consult  Pharmacy Consult for Lovenox Indication: atrial fibrillation  No Known Allergies  Patient Measurements: Height: 5\' 3"  (160 cm) Weight: 54.6 kg (120 lb 5.9 oz) IBW/kg (Calculated) : 52.4 Lovenox Dosing Weight: 54.6 kg  Vital Signs: Temp: 97.5 F (36.4 C) (11/29 0825) Temp Source: Oral (11/29 0825) BP: 146/71 (11/29 0825) Pulse Rate: 101 (11/29 0825)  Labs: Recent Labs    05/03/21 0132 05/03/21 1256 05/04/21 0036 05/05/21 0202  HGB 13.9  --  16.5* 15.8*  HCT 43.1  --  52.7* 50.1*  PLT 258  --  415* 357  LABPROT  --  14.7  --   --   INR  --  1.1  --   --   CREATININE 1.50*  --  1.50* 2.14*    Estimated Creatinine Clearance: 14.5 mL/min (A) (by C-G formula based on SCr of 2.14 mg/dL (H)).  Assessment: 87 yo female with paroxysmal atrial fibrillation on Xarelto 15 mg daily prior to admission, last dose 11/22 1400. Pharmacy consulted for treatment enoxaparin dosing.  Xarelto on hold for procedures.     Lovenox begun with 60 mg (~ 1mg /kg) SQ q24hrs for crcl < 30 ml/min.    Creatinine has trended up. CBC stable.    Weight has fluctuated some, 53.5-56.6 kg.  Goal of Therapy:  Appropriate Lovenox dose for indication Monitor platelets by anticoagulation protocol: Yes   Plan:  Lovenox adjusted from 60 to 55 mg SQ q24hrs. Will f/u weights and re-adjusted if significant change. CBC at least every 3 days while on Lovenox. Xarelto on hold.  Arty Baumgartner, [place] 05/05/2021,9:41 AM

## 2021-05-05 NOTE — Progress Notes (Signed)
PROGRESS NOTE    Hannah Stein  KGU:[id number] DOB: 10/25/1928 DOA: 04/15/2021 PCP: Kelton Pillar, MD   Brief Narrative:  Hannah Stein is a 87 y.o. female with medical history significant of PAF on xarelto, COPD,  who presented with abdominal pain, N/V and poor PO intake with weight loss. she also has epigastric pain since middle of October. Colonoscopy around 13-26 years of age. Report normal except for hemorrhoids.  Upon arrival to ED, she was fairly hemodynamically stable and afebrile. CT abdomen pelvis shows Abnormal circumferential wall thickening in the sigmoid colon possible mass measuring 8.2 x 3.4 cm.  Possibility of malignant neoplastic process not excluded.  2.9 cm infrarenal abdominal aortic aneurysm.  Cirrhosis of liver with large ascites.  She was admitted under hospitalist service.  Underwent paracentesis with removal of 4 L of fluid, cytology negative for malignancy.  GI consulted.  Underwent colonoscopy 04/09/2021 was found to have partially obstructive sigmoid mass.  Pathology negative for malignancy.  General surgery consulted.  Patient has now developed some obstruction features.  Waiting for further plans from general surgery and GI.   Assessment & Plan:   Principal Problem:   Failure to thrive in adult Active Problems:   Leukocytosis   Paroxysmal atrial fibrillation (HCC)   Acute kidney injury superimposed on chronic kidney disease (HCC)   Ascites   possible sigmoid colon mass   Elevated troponin   Pressure injury of skin  Failure to thrive in adult/generalized weakness: Likely secondary to malignancy.  Nutrition, PT, OT consulted.  PT recommended SNF.  TOC consulted.  Partial small bowel obstruction: No more vomiting but continues to feel nauseous.  Not passing flatus.  Abdominal x-ray concerning for partial small bowel obstruction.  She has not been seen by general surgery in last few days, I have notified them and have requested them to see this patient.  I  will continue clear liquid diet.   sigmoid colon mass with possible cancer: -CT with concerning findings. Abnormal wall thickening in the sigmoid colon. Possible circumferential mass in sigmoid colon measuring approximately 8.2x 3.4cm.  S/p colonoscopy 04/30/2021 which shows partially obstructing sigmoid mass, biopsy taken, pathology negative for malignancy.  General surgery was consulted per GI recommendation.  Patient may still have cancer and perhaps sampling was not done properly.  Waiting for general surgery and GI to come up with definitive plan now that pathology is negative.  I have also consulted palliative care yesterday.  Still waiting to be seen.  Small right-sided pneumothorax:   Curb sided with Dr. Cicero Duck of PCCM who recommended to keep patient on oxygen with a goal to keep oxygen saturation at 100% and repeat chest x-ray yearly to follow resolution.  Repeat chest x-ray shows no change in pneumothorax and it is only 5%.   Hepatic cirrhosis with large ascites: Status post diagnostic and therapeutic paracentesis with retrieval of 4 L of fluid on 04/30/2021.  Cytology negative for malignancy.  Patient is reaccumulating ascites, may need another paracentesis soon.   AKI on CKD stage IIIa: Baseline creatinine seems to be around 1.1 GFR 45.  Presented with creatinine of 1.61 and GFR 30.  Creatinine is worsening, currently 2.14.  Output is not charted.  We will continue IV fluids.  Hyperkalemia: Resolved.   Paroxysmal atrial fibrillation: Rate controlled.  Diltiazem stopped due to junctional rhythm.  Rates are controlled at this point in time.  Continue Lovenox full dose and hold Xarelto until plans from surgery and GI finalized.  Elevated troponin: Very small elevation and flat with no ACS symptoms, indicates demand ischemia.  Will update echo.  She will need that for surgery if she were to have that surgery as well.    Leukocytosis: Nonspecific and resolved.  History hypertension: Blood  pressure normal here and she is not on any medications either.  Infrarenal abdominal aortic aneurysm -2.9cm, follow outpatient   DVT prophylaxis: Place TED hose Start: 05/05/2021 1846   Code Status: Full Code  Family Communication: Son-in-law at the bedside, plan of care discussed in length.  Status is: Inpatient  Remains inpatient appropriate because: Needs further work-up and plan for possible sigmoid mass.   Estimated body mass index is 21.32 kg/m as calculated from the following:   Height as of this encounter: 5\' 3"  (1.6 m).   Weight as of this encounter: 54.6 kg.  Pressure Injury 04/11/2021 Elbow Posterior;Left Stage 2 -  Partial thickness loss of dermis presenting as a shallow open injury with a red, pink wound bed without slough. Red, open (Active)  04/08/2021 2300  Location: Elbow  Location Orientation: Posterior;Left  Staging: Stage 2 -  Partial thickness loss of dermis presenting as a shallow open injury with a red, pink wound bed without slough.  Wound Description (Comments): Red, open  Present on Admission: Yes    Nutritional Assessment: Body mass index is 21.32 kg/m.Marland Kitchen Seen by dietician.  I agree with the assessment and plan as outlined below: Nutrition Status: Nutrition Problem: Inadequate oral intake Etiology: nausea, vomiting Skin Assessment: I have examined the patient's skin and I agree with the wound assessment as performed by the wound care RN as outlined below: Pressure Injury 04/12/2021 Elbow Posterior;Left Stage 2 -  Partial thickness loss of dermis presenting as a shallow open injury with a red, pink wound bed without slough. Red, open (Active)  05/04/2021 2300  Location: Elbow  Location Orientation: Posterior;Left  Staging: Stage 2 -  Partial thickness loss of dermis presenting as a shallow open injury with a red, pink wound bed without slough.  Wound Description (Comments): Red, open  Present on Admission: Yes    Consultants:  GI General  surgery  Procedures:  None  Antimicrobials:  Anti-infectives (From admission, onward)    None          Subjective:  Patient seen and examined.  She complains of some nausea and just not feeling well.  No specific complaint.  No vomiting.  Not passing flatus.  Objective: Vitals:   05/05/21 0337 05/05/21 0825 05/05/21 0826 05/05/21 1119  BP: 134/64 (!) 146/71  138/62  Pulse: 89 (!) 101  92  Resp: 20 20  16   Temp: 97.6 F (36.4 C) (!) 97.5 F (36.4 C)  97.6 F (36.4 C)  TempSrc: Oral Oral  Oral  SpO2: 98% 95% 97% 96%  Weight: 54.6 kg     Height:        Intake/Output Summary (Last 24 hours) at 05/05/2021 1131 Last data filed at 05/05/2021 0934 Gross per 24 hour  Intake 2413.39 ml  Output --  Net 2413.39 ml    Filed Weights   05/02/21 0305 05/04/21 0406 05/05/21 0337  Weight: 56.6 kg 55 kg 54.6 kg    Examination:  General exam: Appears calm and comfortable, cachectic Respiratory system: Clear to auscultation. Respiratory effort normal. Cardiovascular system: S1 & S2 heard, RRR. No JVD, murmurs, rubs, gallops or clicks. No pedal edema. Gastrointestinal system: Abdomen is slightly distended, soft and nontender, positive fluid shift. No organomegaly  or masses felt. Normal bowel sounds heard. Central nervous system: Alert and oriented. No focal neurological deficits. Extremities: Symmetric 5 x 5 power. Skin: No rashes, lesions or ulcers.  Psychiatry: Judgement and insight appear normal. Mood & affect appropriate.    Data Reviewed: I have personally reviewed following labs and imaging studies  CBC: Recent Labs  Lab 05/05/2021 1400 04/30/21 0120 05/04/2021 1104 05/02/21 0210 05/03/21 0132 05/04/21 0036 05/05/21 0202  WBC 11.6*   < > 14.9* 15.7* 10.5 16.3* 13.9*  NEUTROABS 9.6*  --  12.7* 13.3*  --   --   --   HGB 15.0   < > 16.7* 16.3* 13.9 16.5* 15.8*  HCT 47.8*   < > 53.7* 50.7* 43.1 52.7* 50.1*  MCV 88.2   < > 88.3 86.7 86.9 88.0 87.0  PLT 342   < >  324 351 258 415* 357   < > = values in this interval not displayed.    Basic Metabolic Panel: Recent Labs  Lab 05/06/2021 1104 05/02/21 0210 05/03/21 0132 05/04/21 0036 05/04/21 1327 05/05/21 0202  NA 143 138 138 138  --  144  K 4.6 5.3* 4.0 5.2* 4.5 4.5  CL 109 105 108 108  --  114*  CO2 22 23 23  18*  --  19*  GLUCOSE 108* 147* 108* 134*  --  113*  BUN 39* 42* 39* 38*  --  48*  CREATININE 1.53* 1.70* 1.50* 1.50*  --  2.14*  CALCIUM 9.0 9.3 8.3* 8.6*  --  8.7*    GFR: Estimated Creatinine Clearance: 14.5 mL/min (A) (by C-G formula based on SCr of 2.14 mg/dL (H)). Liver Function Tests: Recent Labs  Lab 04/23/2021 1400 04/30/21 0120  AST 27 25  ALT 23 23  ALKPHOS 51 46  BILITOT 1.0 0.9  PROT 5.2* 4.5*  ALBUMIN 2.9* 2.4*    Recent Labs  Lab 05/03/2021 1400  LIPASE 32    No results for input(s): AMMONIA in the last 168 hours. Coagulation Profile: Recent Labs  Lab 04/14/2021 1722 05/03/21 1256  INR 1.2 1.1    Cardiac Enzymes: No results for input(s): CKTOTAL, CKMB, CKMBINDEX, TROPONINI in the last 168 hours. BNP (last 3 results) No results for input(s): PROBNP in the last 8760 hours. HbA1C: No results for input(s): HGBA1C in the last 72 hours. CBG: No results for input(s): GLUCAP in the last 168 hours. Lipid Profile: No results for input(s): CHOL, HDL, LDLCALC, TRIG, CHOLHDL, LDLDIRECT in the last 72 hours. Thyroid Function Tests: No results for input(s): TSH, T4TOTAL, FREET4, T3FREE, THYROIDAB in the last 72 hours. Anemia Panel: No results for input(s): VITAMINB12, FOLATE, FERRITIN, TIBC, IRON, RETICCTPCT in the last 72 hours. Sepsis Labs: No results for input(s): PROCALCITON, LATICACIDVEN in the last 168 hours.  Recent Results (from the past 240 hour(s))  Resp Panel by RT-PCR (Flu A&B, Covid) Nasopharyngeal Swab     Status: None   Collection Time: 04/07/2021  2:55 PM   Specimen: Nasopharyngeal Swab; Nasopharyngeal(NP) swabs in vial transport medium  Result  Value Ref Range Status   SARS Coronavirus 2 by RT PCR NEGATIVE NEGATIVE Final    Comment: (NOTE) SARS-CoV-2 target nucleic acids are NOT DETECTED.  The SARS-CoV-2 RNA is generally detectable in upper respiratory specimens during the acute phase of infection. The lowest concentration of SARS-CoV-2 viral copies this assay can detect is 138 copies/mL. A negative result does not preclude SARS-Cov-2 infection and should not be used as the sole basis for treatment or other  patient management decisions. A negative result may occur with  improper specimen collection/handling, submission of specimen other than nasopharyngeal swab, presence of viral mutation(s) within the areas targeted by this assay, and inadequate number of viral copies(<138 copies/mL). A negative result must be combined with clinical observations, patient history, and epidemiological information. The expected result is Negative.  Fact Sheet for Patients:  EntrepreneurPulse.com.au  Fact Sheet for Healthcare Providers:  IncredibleEmployment.be  This test is no t yet approved or cleared by the [place] FDA and  has been authorized for detection and/or diagnosis of SARS-CoV-2 by FDA under an Emergency Use Authorization (EUA). This EUA will remain  in effect (meaning this test can be used) for the duration of the COVID-19 declaration under Section 564(b)(1) of the Act, 21 U.S.C.section 360bbb-3(b)(1), unless the authorization is terminated  or revoked sooner.       Influenza A by PCR NEGATIVE NEGATIVE Final   Influenza B by PCR NEGATIVE NEGATIVE Final    Comment: (NOTE) The Xpert Xpress SARS-CoV-2/FLU/RSV plus assay is intended as an aid in the diagnosis of influenza from Nasopharyngeal swab specimens and should not be used as a sole basis for treatment. Nasal washings and aspirates are unacceptable for Xpert Xpress SARS-CoV-2/FLU/RSV testing.  Fact Sheet for  Patients: EntrepreneurPulse.com.au  Fact Sheet for Healthcare Providers: IncredibleEmployment.be  This test is not yet approved or cleared by the [place] FDA and has been authorized for detection and/or diagnosis of SARS-CoV-2 by FDA under an Emergency Use Authorization (EUA). This EUA will remain in effect (meaning this test can be used) for the duration of the COVID-19 declaration under Section 564(b)(1) of the Act, 21 U.S.C. section 360bbb-3(b)(1), unless the authorization is terminated or revoked.  Performed at [facility], [place] [address], [place], [place] [address]   Aerobic/Anaerobic Culture w Gram Stain (surgical/deep wound)     Status: None   Collection Time: 04/30/21 11:23 AM   Specimen: PATH Cytology Peritoneal fluid  Result Value Ref Range Status   Specimen Description PERITONEAL FLUID  Final   Special Requests NONE  Final   Gram Stain   Final    RARE WBC PRESENT,BOTH PMN AND MONONUCLEAR NO ORGANISMS SEEN    Culture   Final    No growth aerobically or anaerobically. Performed at [facility], [place] [address], [place], [place] [address]    Report Status 05/05/2021 FINAL  Final  Surgical PCR screen     Status: None   Collection Time: 04/30/21  8:54 PM   Specimen: Nasal Mucosa; Nasal Swab  Result Value Ref Range Status   MRSA, PCR NEGATIVE NEGATIVE Final   Staphylococcus aureus NEGATIVE NEGATIVE Final    Comment: (NOTE) The Xpert SA Assay (FDA approved for NASAL specimens in patients 32 years of age and older), is one component of a comprehensive surveillance program. It is not intended to diagnose infection nor to guide or monitor treatment. Performed at [facility], [place] [address] [place], [place] [address]        Radiology Studies: DG Abd 1 View  Result Date: 05/04/2021 CLINICAL DATA:  Clinical diagnosis of small-bowel obstruction. EXAM: ABDOMEN - 1 VIEW COMPARISON:  Abdomen and pelvis CT  dated 04/17/2021 FINDINGS: Centralized bowel loops compatible with previously demonstrated large volume ascites. Mildly dilated loops of small bowel in the left mid abdomen. Gas in normal caliber rectum. No gross free peritoneal air. Lumbar spine degenerative changes. IMPRESSION: 1. Mild partial small bowel obstruction or ileus.  2. Recently demonstrated ascites. Electronically Signed   By: Claudie Revering M.D.   On: 05/04/2021 09:55   DG CHEST PORT 1 VIEW  Result Date: 05/05/2021 CLINICAL DATA:  Pneumothorax EXAM: PORTABLE CHEST 1 VIEW COMPARISON:  05/04/2021 FINDINGS: Minimal residual right apical pneumothorax, less than 5% volume, not significantly changed compared to prior. Severe emphysema. No acute airspace opacity. Heart and mediastinum are normal. IMPRESSION: 1. Minimal residual right apical pneumothorax, less than 5% volume, not significantly changed compared to prior. 2. Severe emphysema. Electronically Signed   By: Delanna Ahmadi M.D.   On: 05/05/2021 08:10   DG CHEST PORT 1 VIEW  Result Date: 05/04/2021 CLINICAL DATA:  Evaluate pneumothorax. EXAM: PORTABLE CHEST 1 VIEW COMPARISON:  CT chest 05/02/2021. FINDINGS: Persistent right-sided pneumothorax is identified with creasing right apical component which measures 1.4 cm in maximum thickness. This is compared with 1 cm when measured off the chest CT from 05/02/2021. There is a long linear lucency which extends along the periphery of the right lung which is felt to represent skin fold artifact. The lungs are hyperinflated and there are diffuse interstitial changes compatible with COPD/emphysema. IMPRESSION: Persistent right-sided pneumothorax with mild increase in right apical component. These results will be called to the ordering clinician or representative by the Radiologist Assistant, and communication documented in the PACS or [employer]. Electronically Signed   By: Kerby Moors M.D.   On: 05/04/2021 12:37    Scheduled Meds:   cholecalciferol  2,000 Units Oral BID   enoxaparin (LOVENOX) injection  55 mg Subcutaneous Q24H   feeding supplement  237 mL Oral BID BM   mupirocin ointment  1 application Nasal BID   pantoprazole (PROTONIX) IV  40 mg Intravenous Q24H   vitamin B-12  500 mcg Oral Daily   Continuous Infusions:  sodium chloride 100 mL/hr at 05/05/21 0834     LOS: 5 days   Time spent: 29 minutes   Darliss Cheney, MD [facility]  05/05/2021, 11:31 AM  Please page via [facility] and do not message via secure chat for anything urgent. Secure chat can be used for anything non urgent.  How to contact the [facility] Attending or Consulting provider [place] or covering provider during after hours [place], for this patient?  Check the care team in [facility] and look for a) attending/consulting [facility] provider listed and b) the [facility] team listed. Page or secure chat [phone]. Log into [email or web address] and use [place]'s universal password to access. If you do not have the password, please contact the hospital operator. Locate the [facility] provider you are looking for under [facility] and page to a number that you can be directly reached. If you still have difficulty reaching the provider, please page the [facility] (Director on Call) for the Hospitalists listed on amion for assistance.

## 2021-05-06 ENCOUNTER — Inpatient Hospital Stay (HOSPITAL_COMMUNITY): Payer: Medicare Other

## 2021-05-06 DIAGNOSIS — Z7189 Other specified counseling: Secondary | ICD-10-CM

## 2021-05-06 DIAGNOSIS — K6389 Other specified diseases of intestine: Secondary | ICD-10-CM

## 2021-05-06 DIAGNOSIS — R110 Nausea: Secondary | ICD-10-CM

## 2021-05-06 DIAGNOSIS — R627 Adult failure to thrive: Secondary | ICD-10-CM | POA: Diagnosis not present

## 2021-05-06 DIAGNOSIS — R109 Unspecified abdominal pain: Secondary | ICD-10-CM | POA: Diagnosis not present

## 2021-05-06 DIAGNOSIS — Z515 Encounter for palliative care: Secondary | ICD-10-CM

## 2021-05-06 LAB — BASIC METABOLIC PANEL
Anion gap: 8 (ref 5–15)
BUN: 55 mg/dL — ABNORMAL HIGH (ref 8–23)
CO2: 19 mmol/L — ABNORMAL LOW (ref 22–32)
Calcium: 8 mg/dL — ABNORMAL LOW (ref 8.9–10.3)
Chloride: 117 mmol/L — ABNORMAL HIGH (ref 98–111)
Creatinine, Ser: 2.17 mg/dL — ABNORMAL HIGH (ref 0.44–1.00)
GFR, Estimated: 21 mL/min — ABNORMAL LOW (ref >60)
Glucose, Bld: 96 mg/dL (ref 70–99)
Potassium: 4.1 mmol/L (ref 3.5–5.1)
Sodium: 144 mmol/L (ref 135–145)

## 2021-05-06 MED ORDER — SODIUM CHLORIDE 0.9 % IV SOLN: INTRAVENOUS | Status: DC

## 2021-05-06 NOTE — Progress Notes (Signed)
Nutrition Follow-up  DOCUMENTATION CODES:   Severe malnutrition in context of chronic illness  INTERVENTION:   Continue clear liquids PO as tolerated.  If within goals of care, agree with TPN to meet nutrition needs, as patient is unable to meet nutrition needs via GI tract d/t partial obstruction.  NUTRITION DIAGNOSIS:   Severe Malnutrition related to chronic illness (COPD, suspected cancer) as evidenced by severe muscle depletion, severe fat depletion.  Ongoing   GOAL:   Patient will meet greater than or equal to 90% of their needs  Unmet  MONITOR:   PO intake, Supplement acceptance, Diet advancement, Labs, Skin  REASON FOR ASSESSMENT:   Consult Assessment of nutrition requirement/status  ASSESSMENT:   87 y.o. female with medical history significant of PAF on xarelto, COPD, who presented with abdominal pain, N/V and poor PO intake over the past 1 month with weight loss. CT abdomen pelvis shows Abnormal circumferential wall thickening in the sigmoid colon possible mass measuring 8.2 x 3.4 cm. Possibility of malignant neoplastic process not excluded. 2.9 cm infrarenal abdominal aortic aneurysm.  Cirrhosis of liver with large ascites. Underwent paracentesis with removal of 4 L of fluid 11/24.  Patient with suspected metastatic colon cancer with partial obstruction.  Plans for flex sigmoidoscopy and biopsy on Friday with possible stent placement.  Per GI note, may begin TPN since unable to take adequate PO's with partial obstruction.   Palliative care team is following. Patient wishes to continue current treatment and remain full code.   Spoke with patient and her daughter at bedside. Patient was sleeping, so most information was provided by her daughter. Patient typically eats well at home. She lives at home independently. For the past 2 weeks, she has had minimal oral intake. Since admission, intake has been very poor. Patient has a partial obstruction, which is limiting  what she can take in PO. She also c/o pain and discomfort in her throat/chest when she swallows. Clear liquid lunch tray at bedside, 0% consumed. Daughter tried to give her jello, but she could not get it down.   Labs reviewed.  Medications reviewed and include cholecalciferol, Protonix, vitamin B-12 tablet. IVF: NS at 100 ml/h.  Patient meets criteria for severe malnutrition with severe depletion of muscle and subcutaneous fat mass.  NUTRITION - FOCUSED PHYSICAL EXAM:  Flowsheet Row Most Recent Value  Orbital Region Severe depletion  Upper Arm Region Moderate depletion  Thoracic and Lumbar Region Severe depletion  Buccal Region Severe depletion  Temple Region Severe depletion  Clavicle Bone Region Severe depletion  Clavicle and Acromion Bone Region Severe depletion  Scapular Bone Region Severe depletion  Dorsal Hand Severe depletion  Patellar Region Moderate depletion  Anterior Thigh Region Moderate depletion  Posterior Calf Region Moderate depletion  Edema (RD Assessment) Moderate  Hair Reviewed  Eyes Reviewed  Mouth Reviewed  Skin Reviewed  Nails Reviewed       Diet Order:   Diet Order             Diet clear liquid Room service appropriate? Yes; Fluid consistency: Thin  Diet effective now                   EDUCATION NEEDS:   Not appropriate for education at this time  Skin:  Skin Assessment: Skin Integrity Issues: Skin Integrity Issues:: Stage II Stage II: L elbow  Last BM:  11/23  Height:   Ht Readings from Last 1 Encounters:  04/12/2021 5\' 3"  (1.6 m)  Weight:   Wt Readings from Last 1 Encounters:  05/05/21 54.6 kg    BMI:  Body mass index is 21.32 kg/m.  Estimated Nutritional Needs:   Kcal:  1600-1800  Protein:  80-90 grams  Fluid:  >/= 1.6 L/day    Lucas Mallow, RD, LDN, CNSC Please refer to Amion for contact information.

## 2021-05-06 NOTE — Consult Note (Signed)
Consultation Note Date: 05/06/2021   Patient Name: Hannah Stein  DOB: 25-Mar-1929  MRN: [id number]  Age / Sex: 87 y.o., female  PCP: Kelton Pillar, MD Referring Physician: Darliss Cheney, MD  Reason for Consultation: Establishing goals of care and Psychosocial/spiritual support  HPI/Patient Profile: 87 y.o. female   admitted on 05/06/2021 with  medical history significant of PAF on xarelto, COPD,  who presented with abdominal pain, N/V and poor PO intake with weight loss. she also has epigastric pain since middle of October. Colonoscopy around 41-43 years of age. Report normal except for hemorrhoids.  Upon arrival to ED, she was fairly hemodynamically stable and afebrile. CT abdomen pelvis shows Abnormal circumferential wall thickening in the sigmoid colon possible mass measuring 8.2 x 3.4 cm.  Possibility of malignant neoplastic process not excluded.  2.9 cm infrarenal abdominal aortic aneurysm.  Cirrhosis of liver with large ascites.  She was admitted under hospitalist service.  Underwent paracentesis with removal of 4 L of fluid, cytology negative for malignancy.  GI consulted.  Underwent colonoscopy 04/19/2021 was found to have partially obstructive sigmoid mass.  Pathology negative for malignancy.  General surgery consulted.  Patient has now developed some obstruction features.  Waiting for further plans from general surgery and GI.   Patient family face treatment option decisions, advanced directive decisions   Clinical Assessment and Goals of Care:   This NP Wadie Lessen reviewed medical records, received report from team, assessed the patient and then meet at the patient's bedside along with her daughter Hannah Stein ( her only child and main decision maker) to discuss diagnosis, prognosis, GOC, EOL wishes disposition and options.   Concept of Palliative Care was introduced as specialized medical care for  people and their families living with serious illness.  If focuses on providing relief from the symptoms and stress of a serious illness.  The goal is to improve quality of life for both the patient and the family.  Values and goals of care important to patient and family were attempted to be elicited.  Created space and opportunity for patient  and family to explore thoughts and feelings regarding current medical situation.  Patient verbalizes a surface understanding of the seriousness of her current medical situation and simply states "I want to do what ever it takes to live".  A  discussion was had today regarding advanced directives.  Concepts specific to code status, artifical feeding and hydration, continued IV antibiotics and rehospitalization was had.  The difference between a aggressive medical intervention path  and a palliative comfort care path for this patient at this time was had.     MOST form introduced and a hard choices booklet was left for review  Patient tells me that she never thought about or  talked about her end-of-life timeframe or wishes.  Her daughter however is very pragmatic, understands the seriousness, is willing to support her mother and her decision to move forward with GI recommendation for stent and biopsing and take it 1 day at a time.  Daughter clearly verbalizes to her mother that if things do not go well she will not allow prolonged treatments and suffering.     Questions and concerns addressed.  Family encouraged to call with questions or concerns.     PMT will continue to support holistically.         No documented healthcare power of attorney or advanced care planning documents.  Patient's daughter Hannah Stein is her only child, and main support and decision maker in the event the patient cannot make her own decisions.      SUMMARY OF RECOMMENDATIONS    Code Status/Advance Care Planning: Full code Patient will need ongoing palliative support, as  she and her daughter navigate healthcare decisions depending on outcomes during this hospitalization.   Palliative Prophylaxis:  Frequent Pain Assessment Dilaudid 0.5 mg IV every 4 hours as needed for pain  Additional Recommendations (Limitations, Scope, Preferences): Full Comfort Care  Psycho-social/Spiritual:  Desire for further Chaplaincy support:no   Prognosis:  Unable to determine  Discharge Planning: To Be Determined      Primary Diagnoses: Present on Admission:  Failure to thrive in adult  Acute kidney injury superimposed on chronic kidney disease ([place])  Ascites  possible sigmoid colon mass  Leukocytosis  Elevated troponin  Paroxysmal atrial fibrillation ([place])   I have reviewed the medical record, interviewed the patient and family, and examined the patient. The following aspects are pertinent.  Past Medical History:  Diagnosis Date   DVT, lower extremity ([place])    Social History   Socioeconomic History   Marital status: Divorced    Spouse name: Not on file   Number of children: Not on file   Years of education: Not on file   Highest education level: Not on file  Occupational History   Not on file  Tobacco Use   Smoking status: Former    Types: Cigarettes    Quit date: 07/22/1982    Years since quitting: 38.8   Smokeless tobacco: Never  Substance and Sexual Activity   Alcohol use: No   Drug use: No   Sexual activity: Not on file  Other Topics Concern   Not on file  Social History Narrative   Not on file   Social Determinants of Health   Financial Resource Strain: Not on file  Food Insecurity: Not on file  Transportation Needs: Not on file  Physical Activity: Not on file  Stress: Not on file  Social Connections: Not on file   Family History  Problem Relation Age of Onset   Heart failure Mother    CVA Mother        "mini strokes"   Scheduled Meds:  cholecalciferol  2,000 Units Oral BID   enoxaparin (LOVENOX) injection  55 mg  Subcutaneous Q24H   feeding supplement  237 mL Oral BID BM   pantoprazole (PROTONIX) IV  40 mg Intravenous Q24H   vitamin B-12  500 mcg Oral Daily   Continuous Infusions:  sodium chloride 100 mL/hr at 05/05/21 1640   PRN Meds:.acetaminophen **OR** acetaminophen, albuterol, HYDROmorphone (DILAUDID) injection, ondansetron **OR** ondansetron (ZOFRAN) IV, oxyCODONE Medications Prior to Admission:  Prior to Admission medications   Medication Sig Start Date End Date Taking? Authorizing Provider  Cholecalciferol (VITAMIN D) 50 MCG (2000 UT) CAPS Take 1 capsule by mouth 2 (two) times daily.   Yes [provider]  Rivaroxaban (XARELTO) 15 MG TABS tablet Take 1 tablet (15 mg total) by mouth daily with supper. 04/11/20  Yes Harrell Gave, [place],  MD  vitamin B-12 (CYANOCOBALAMIN) 500 MCG tablet Take 500 mcg by mouth daily.   Yes [provider]   No Known Allergies Review of Systems  Gastrointestinal:  Positive for nausea.  Neurological:  Positive for weakness.   Physical Exam Constitutional:      Appearance: She is cachectic. She is ill-appearing.  Cardiovascular:     Rate and Rhythm: Normal rate.  Pulmonary:     Effort: Tachypnea present.  Skin:    General: Skin is warm and dry.  Neurological:     Mental Status: She is lethargic.    Vital Signs: BP (!) 110/46 (BP Location: Right Arm)   Pulse 80   Temp 97.6 F (36.4 C) (Oral)   Resp 15   Ht 5\' 3"  (1.6 m)   Wt 54.6 kg   SpO2 98%   BMI 21.32 kg/m  Pain Scale: 0-10   Pain Score: 0-No pain   SpO2: SpO2: 98 % O2 Device:SpO2: 98 % O2 Flow Rate: .O2 Flow Rate (L/min): 2 L/min  IO: Intake/output summary:  Intake/Output Summary (Last 24 hours) at 05/06/2021 1321 Last data filed at 05/06/2021 1105 Gross per 24 hour  Intake 764.9 ml  Output 300 ml  Net 464.9 ml    LBM: Last BM Date: 05/03/21 Baseline Weight: Weight: 53.5 kg Most recent weight: Weight: 54.6 kg     Palliative Assessment/Data:      Discussed with Dr. Doristine Bosworth via secure chat  Time In: 1400 Time Out: 1510 Time Total: 70 minutes Greater than 50%  of this time was spent counseling and coordinating care related to the above assessment and plan.  Signed by: Wadie Lessen, NP   Please contact Palliative Medicine Team phone at [phone] for questions and concerns.  For individual provider: See Shea Evans

## 2021-05-06 NOTE — Progress Notes (Signed)
PROGRESS NOTE    Hannah Stein  XKP:[id number] DOB: 15-Mar-1929 DOA: 05/06/2021 PCP: Kelton Pillar, MD   Brief Narrative:  Hannah Stein is a 87 y.o. female with medical history significant of PAF on xarelto, COPD,  who presented with abdominal pain, N/V and poor PO intake with weight loss. she also has epigastric pain since middle of October. Colonoscopy around 33-26 years of age. Report normal except for hemorrhoids.  Upon arrival to ED, she was fairly hemodynamically stable and afebrile. CT abdomen pelvis shows Abnormal circumferential wall thickening in the sigmoid colon possible mass measuring 8.2 x 3.4 cm.  Possibility of malignant neoplastic process not excluded.  2.9 cm infrarenal abdominal aortic aneurysm.  Cirrhosis of liver with large ascites.  She was admitted under hospitalist service.  Underwent paracentesis with removal of 4 L of fluid, cytology negative for malignancy.  GI consulted.  Underwent colonoscopy 05/06/2021 was found to have partially obstructive sigmoid mass.  Pathology negative for malignancy.  General surgery consulted.  Patient has now developed some obstruction features.  Waiting for further plans from general surgery and GI.   Assessment & Plan:   Principal Problem:   Failure to thrive in adult Active Problems:   Leukocytosis   Paroxysmal atrial fibrillation (HCC)   Acute kidney injury superimposed on chronic kidney disease (HCC)   Ascites   possible sigmoid colon mass   Elevated troponin   Pressure injury of skin  Failure to thrive in adult/generalized weakness: Likely secondary to malignancy.  Nutrition, PT, OT consulted.  PT recommended SNF.  TOC consulted.  Partial small bowel obstruction: Patient very lethargic today, unable to provide much of the answers to questions today.  I had notified general surgery yesterday, per their note, they do not think patient has partial small bowel obstruction.  Will defer management to them.  sigmoid colon mass  with possible cancer: -CT with concerning findings. Abnormal wall thickening in the sigmoid colon. Possible circumferential mass in sigmoid colon measuring approximately 8.2x 3.4cm.  S/p colonoscopy 04/26/2021 which shows partially obstructing sigmoid mass, biopsy taken, pathology negative for malignancy.  General surgery was consulted per GI recommendation.  Patient may still have cancer and perhaps sampling was not done properly.  Patient was seen by surgery yesterday, per their note, she is very high risk surgical candidate and thus they recommended and discussed with GI to proceed with sigmoidoscopy or colonic stent if appropriate.  Patient is seen by GI, GI had lengthy discussion with patient's family and after explaining all the risks and benefits, they have scheduled the patient for sigmoidoscopy with possible colonic stent on Friday.  Small right-sided pneumothorax:   Curb sided with Dr. Cicero Duck of PCCM who recommended to keep patient on oxygen with a goal to keep oxygen saturation at 100% and repeat chest x-ray yearly to follow resolution.  Repeat chest x-ray once again shows no change in pneumothorax of 5%.   Hepatic cirrhosis with large ascites: Status post diagnostic and therapeutic paracentesis with retrieval of 4 L of fluid on 04/30/2021.  Cytology negative for malignancy.  Patient is reaccumulating ascites, may need another paracentesis soon.   AKI on CKD stage IIIa: Baseline creatinine seems to be around 1.1 GFR 45.  Presented with creatinine of 1.61 and GFR 30.  Creatinine is worsening, currently 2.14.  Output is not charted appropriately.  We will continue IV fluids.  Repeat labs in the morning.  Hyperkalemia: Resolved.   Paroxysmal atrial fibrillation: Rate controlled.  Diltiazem stopped due to  junctional rhythm.  Rates are controlled at this point in time.  Continue Lovenox full dose and hold Xarelto until plans from surgery and GI finalized.   Elevated troponin: Very small elevation  and flat with no ACS symptoms, indicates demand ischemia.  Will update echo.  She will need that for surgery if she were to have that surgery as well.    Leukocytosis: Nonspecific and resolved.  History hypertension: Blood pressure normal here and she is not on any medications either.  Infrarenal abdominal aortic aneurysm -2.9cm, follow outpatient   GOC: Patient appears to be declining.  Remains full code.  Discussed CODE STATUS with the son-in-law but he did not feel comfortable making any decisions.  I have consulted palliative care 2 days ago, still waiting for them to see if this patient.  I have personally notified palliative care team today.  DVT prophylaxis: Place TED hose Start: 04/11/2021 1846   Code Status: Full Code  Family Communication: Son-in-law at the bedside, plan of care discussed in length.  Status is: Inpatient  Remains inpatient appropriate because: Needs further work-up and plan for possible sigmoid mass.   Estimated body mass index is 21.32 kg/m as calculated from the following:   Height as of this encounter: 5\' 3"  (1.6 m).   Weight as of this encounter: 54.6 kg.  Pressure Injury 04/24/2021 Elbow Posterior;Left Stage 2 -  Partial thickness loss of dermis presenting as a shallow open injury with a red, pink wound bed without slough. Red, open (Active)  04/26/2021 2300  Location: Elbow  Location Orientation: Posterior;Left  Staging: Stage 2 -  Partial thickness loss of dermis presenting as a shallow open injury with a red, pink wound bed without slough.  Wound Description (Comments): Red, open  Present on Admission: Yes     Pressure Injury 05/05/21 Buttocks Mid Stage 2 -  Partial thickness loss of dermis presenting as a shallow open injury with a red, pink wound bed without slough. area of broken skin between buttocks. (Active)  05/05/21 1700  Location: Buttocks  Location Orientation: Mid  Staging: Stage 2 -  Partial thickness loss of dermis presenting as a shallow  open injury with a red, pink wound bed without slough.  Wound Description (Comments): area of broken skin between buttocks.  Present on Admission:     Nutritional Assessment: Body mass index is 21.32 kg/m.Marland Kitchen Seen by dietician.  I agree with the assessment and plan as outlined below: Nutrition Status: Nutrition Problem: Inadequate oral intake Etiology: nausea, vomiting Skin Assessment: I have examined the patient's skin and I agree with the wound assessment as performed by the wound care RN as outlined below: Pressure Injury 04/26/2021 Elbow Posterior;Left Stage 2 -  Partial thickness loss of dermis presenting as a shallow open injury with a red, pink wound bed without slough. Red, open (Active)  04/22/2021 2300  Location: Elbow  Location Orientation: Posterior;Left  Staging: Stage 2 -  Partial thickness loss of dermis presenting as a shallow open injury with a red, pink wound bed without slough.  Wound Description (Comments): Red, open  Present on Admission: Yes     Pressure Injury 05/05/21 Buttocks Mid Stage 2 -  Partial thickness loss of dermis presenting as a shallow open injury with a red, pink wound bed without slough. area of broken skin between buttocks. (Active)  05/05/21 1700  Location: Buttocks  Location Orientation: Mid  Staging: Stage 2 -  Partial thickness loss of dermis presenting as a shallow open injury with  a red, pink wound bed without slough.  Wound Description (Comments): area of broken skin between buttocks.  Present on Admission:     Consultants:  GI General surgery  Procedures:  None  Antimicrobials:  Anti-infectives (From admission, onward)    None          Subjective:  Seen and examined, sleepy and lethargic.  Looks comfortable though.  Objective: Vitals:   05/05/21 2356 05/06/21 0435 05/06/21 0710 05/06/21 1106  BP: (!) 122/51 (!) 111/45 (!) 115/46 (!) 110/46  Pulse: 90 87 87 80  Resp: 19 17 17 15   Temp: 98.5 F (36.9 C) 98.2 F (36.8  C) 97.7 F (36.5 C) 97.6 F (36.4 C)  TempSrc: Oral Oral Oral Oral  SpO2: 97% 95% 97% 98%  Weight:      Height:        Intake/Output Summary (Last 24 hours) at 05/06/2021 1249 Last data filed at 05/06/2021 1105 Gross per 24 hour  Intake 764.9 ml  Output 300 ml  Net 464.9 ml    Filed Weights   05/02/21 0305 05/04/21 0406 05/05/21 0337  Weight: 56.6 kg 55 kg 54.6 kg    Examination:  General exam: Appears sleepy but comfortable. Respiratory system: Clear to auscultation. Respiratory effort normal. Cardiovascular system: S1 & S2 heard, RRR. No JVD, murmurs, rubs, gallops or clicks. No pedal edema. Gastrointestinal system: Abdomen is nondistended, soft and nontender. No organomegaly or masses felt. Normal bowel sounds heard. Central nervous system: Sleepy.   Data Reviewed: I have personally reviewed following labs and imaging studies  CBC: Recent Labs  Lab 04/24/2021 1400 04/30/21 0120 05/06/2021 1104 05/02/21 0210 05/03/21 0132 05/04/21 0036 05/05/21 0202  WBC 11.6*   < > 14.9* 15.7* 10.5 16.3* 13.9*  NEUTROABS 9.6*  --  12.7* 13.3*  --   --   --   HGB 15.0   < > 16.7* 16.3* 13.9 16.5* 15.8*  HCT 47.8*   < > 53.7* 50.7* 43.1 52.7* 50.1*  MCV 88.2   < > 88.3 86.7 86.9 88.0 87.0  PLT 342   < > 324 351 258 415* 357   < > = values in this interval not displayed.    Basic Metabolic Panel: Recent Labs  Lab 05/02/21 0210 05/03/21 0132 05/04/21 0036 05/04/21 1327 05/05/21 0202 05/06/21 0116  NA 138 138 138  --  144 144  K 5.3* 4.0 5.2* 4.5 4.5 4.1  CL 105 108 108  --  114* 117*  CO2 23 23 18*  --  19* 19*  GLUCOSE 147* 108* 134*  --  113* 96  BUN 42* 39* 38*  --  48* 55*  CREATININE 1.70* 1.50* 1.50*  --  2.14* 2.17*  CALCIUM 9.3 8.3* 8.6*  --  8.7* 8.0*    GFR: Estimated Creatinine Clearance: 14.3 mL/min (A) (by C-G formula based on SCr of 2.17 mg/dL (H)). Liver Function Tests: Recent Labs  Lab 04/25/2021 1400 04/30/21 0120  AST 27 25  ALT 23 23   ALKPHOS 51 46  BILITOT 1.0 0.9  PROT 5.2* 4.5*  ALBUMIN 2.9* 2.4*    Recent Labs  Lab 04/30/2021 1400  LIPASE 32    No results for input(s): AMMONIA in the last 168 hours. Coagulation Profile: Recent Labs  Lab 04/30/2021 1722 05/03/21 1256  INR 1.2 1.1    Cardiac Enzymes: No results for input(s): CKTOTAL, CKMB, CKMBINDEX, TROPONINI in the last 168 hours. BNP (last 3 results) No results for input(s): PROBNP in the  last 8760 hours. HbA1C: No results for input(s): HGBA1C in the last 72 hours. CBG: No results for input(s): GLUCAP in the last 168 hours. Lipid Profile: No results for input(s): CHOL, HDL, LDLCALC, TRIG, CHOLHDL, LDLDIRECT in the last 72 hours. Thyroid Function Tests: No results for input(s): TSH, T4TOTAL, FREET4, T3FREE, THYROIDAB in the last 72 hours. Anemia Panel: No results for input(s): VITAMINB12, FOLATE, FERRITIN, TIBC, IRON, RETICCTPCT in the last 72 hours. Sepsis Labs: No results for input(s): PROCALCITON, LATICACIDVEN in the last 168 hours.  Recent Results (from the past 240 hour(s))  Resp Panel by RT-PCR (Flu A&B, Covid) Nasopharyngeal Swab     Status: None   Collection Time: 04/18/2021  2:55 PM   Specimen: Nasopharyngeal Swab; Nasopharyngeal(NP) swabs in vial transport medium  Result Value Ref Range Status   SARS Coronavirus 2 by RT PCR NEGATIVE NEGATIVE Final    Comment: (NOTE) SARS-CoV-2 target nucleic acids are NOT DETECTED.  The SARS-CoV-2 RNA is generally detectable in upper respiratory specimens during the acute phase of infection. The lowest concentration of SARS-CoV-2 viral copies this assay can detect is 138 copies/mL. A negative result does not preclude SARS-Cov-2 infection and should not be used as the sole basis for treatment or other patient management decisions. A negative result may occur with  improper specimen collection/handling, submission of specimen other than nasopharyngeal swab, presence of viral mutation(s) within  the areas targeted by this assay, and inadequate number of viral copies(<138 copies/mL). A negative result must be combined with clinical observations, patient history, and epidemiological information. The expected result is Negative.  Fact Sheet for Patients:  EntrepreneurPulse.com.au  Fact Sheet for Healthcare Providers:  IncredibleEmployment.be  This test is no t yet approved or cleared by the [place] FDA and  has been authorized for detection and/or diagnosis of SARS-CoV-2 by FDA under an Emergency Use Authorization (EUA). This EUA will remain  in effect (meaning this test can be used) for the duration of the COVID-19 declaration under Section 564(b)(1) of the Act, 21 U.S.C.section 360bbb-3(b)(1), unless the authorization is terminated  or revoked sooner.       Influenza A by PCR NEGATIVE NEGATIVE Final   Influenza B by PCR NEGATIVE NEGATIVE Final    Comment: (NOTE) The Xpert Xpress SARS-CoV-2/FLU/RSV plus assay is intended as an aid in the diagnosis of influenza from Nasopharyngeal swab specimens and should not be used as a sole basis for treatment. Nasal washings and aspirates are unacceptable for Xpert Xpress SARS-CoV-2/FLU/RSV testing.  Fact Sheet for Patients: EntrepreneurPulse.com.au  Fact Sheet for Healthcare Providers: IncredibleEmployment.be  This test is not yet approved or cleared by the [place] FDA and has been authorized for detection and/or diagnosis of SARS-CoV-2 by FDA under an Emergency Use Authorization (EUA). This EUA will remain in effect (meaning this test can be used) for the duration of the COVID-19 declaration under Section 564(b)(1) of the Act, 21 U.S.C. section 360bbb-3(b)(1), unless the authorization is terminated or revoked.  Performed at [facility], [place] [address] [place],  [address]   Aerobic/Anaerobic Culture w Gram Stain (surgical/deep  wound)     Status: None   Collection Time: 04/30/21 11:23 AM   Specimen: PATH Cytology Peritoneal fluid  Result Value Ref Range Status   Specimen Description PERITONEAL FLUID  Final   Special Requests NONE  Final   Gram Stain   Final    RARE WBC PRESENT,BOTH PMN AND MONONUCLEAR NO ORGANISMS SEEN    Culture   Final  No growth aerobically or anaerobically. Performed at [facility], [place] [address], [place], [place] [address]    Report Status 05/05/2021 FINAL  Final  Surgical PCR screen     Status: None   Collection Time: 04/30/21  8:54 PM   Specimen: Nasal Mucosa; Nasal Swab  Result Value Ref Range Status   MRSA, PCR NEGATIVE NEGATIVE Final   Staphylococcus aureus NEGATIVE NEGATIVE Final    Comment: (NOTE) The Xpert SA Assay (FDA approved for NASAL specimens in patients 8 years of age and older), is one component of a comprehensive surveillance program. It is not intended to diagnose infection nor to guide or monitor treatment. Performed at [facility], [place] [address], [place], [place] [address]        Radiology Studies: DG CHEST PORT 1 VIEW  Result Date: 05/06/2021 CLINICAL DATA:  Pneumothorax EXAM: PORTABLE CHEST 1 VIEW COMPARISON:  Chest radiograph 05/05/2021 FINDINGS: The cardiomediastinal silhouette is stable. There is a small right apical pneumothorax, likely not significantly changed in size. Additional lines projecting over the right hemithorax likely reflect skin folds. Hyperlucency in the upper lungs again likely reflects emphysema. Patchy opacities in the lung bases likely reflects atelectasis. There is a possible small right pleural effusion. There is no acute osseous abnormality. IMPRESSION: 1. Small right apical pneumothorax, likely not significantly changed in size. 2. Bibasilar atelectasis and small right pleural effusion. Electronically Signed   By: Valetta Mole M.D.   On: 05/06/2021 11:36   DG CHEST PORT 1 VIEW  Result Date:  05/05/2021 CLINICAL DATA:  Pneumothorax EXAM: PORTABLE CHEST 1 VIEW COMPARISON:  05/04/2021 FINDINGS: Minimal residual right apical pneumothorax, less than 5% volume, not significantly changed compared to prior. Severe emphysema. No acute airspace opacity. Heart and mediastinum are normal. IMPRESSION: 1. Minimal residual right apical pneumothorax, less than 5% volume, not significantly changed compared to prior. 2. Severe emphysema. Electronically Signed   By: Delanna Ahmadi M.D.   On: 05/05/2021 08:10    Scheduled Meds:  cholecalciferol  2,000 Units Oral BID   enoxaparin (LOVENOX) injection  55 mg Subcutaneous Q24H   feeding supplement  237 mL Oral BID BM   pantoprazole (PROTONIX) IV  40 mg Intravenous Q24H   vitamin B-12  500 mcg Oral Daily   Continuous Infusions:  sodium chloride 100 mL/hr at 05/05/21 1640     LOS: 6 days   Time spent: 28 minutes   Darliss Cheney, MD [facility]  05/06/2021, 12:49 PM  Please page via [facility] and do not message via secure chat for anything urgent. Secure chat can be used for anything non urgent.  How to contact the [facility] Attending or Consulting provider [place] or covering provider during after hours [place], for this patient?  Check the care team in [facility] and look for a) attending/consulting [facility] provider listed and b) the [facility] team listed. Page or secure chat [phone]. Log into [email or web address] and use [place]'s universal password to access. If you do not have the password, please contact the hospital operator. Locate the [facility] provider you are looking for under [facility] and page to a number that you can be directly reached. If you still have difficulty reaching the provider, please page the [facility] (Director on Call) for the Hospitalists listed on amion for assistance.

## 2021-05-06 NOTE — Progress Notes (Signed)
[name] 11:46 AM  Subjective: Patient more lethargic today no new complaints and case discussed with the son-in-law in person and the surgical team and the patient's daughter on the phone and we had a long talk about the options including the indication for stenting and I answered all of their questions and the risks were thoroughly explained both short-term and long-term  Objective: Vital signs stable afebrile no acute distress abdomen is soft nontender labs essentially unchanged  Assessment: Probable metastatic colon cancer with partial obstruction  Plan: After our conversation as above we have elected to proceed with a flex sig and biopsy on Friday and if possible stent placement even though we will not have a definitive diagnosis at that time the family understands the risks and warnings and are still okay with [place] proceeding and we will check on tomorrow and might consider short-term TPN to improve her nutritional status and unfortunately we were unable to schedule tomorrow based on the current schedule  [facility]  office [phone] After 5PM or if no answer call [phone]

## 2021-05-07 DIAGNOSIS — N189 Chronic kidney disease, unspecified: Secondary | ICD-10-CM

## 2021-05-07 DIAGNOSIS — N179 Acute kidney failure, unspecified: Secondary | ICD-10-CM

## 2021-05-07 DIAGNOSIS — R627 Adult failure to thrive: Secondary | ICD-10-CM | POA: Diagnosis not present

## 2021-05-07 DIAGNOSIS — K6389 Other specified diseases of intestine: Secondary | ICD-10-CM | POA: Diagnosis not present

## 2021-05-07 DIAGNOSIS — I480 Paroxysmal atrial fibrillation: Secondary | ICD-10-CM

## 2021-05-07 DIAGNOSIS — R188 Other ascites: Secondary | ICD-10-CM | POA: Diagnosis not present

## 2021-05-07 MED ORDER — ALBUMIN HUMAN 25 % IV SOLN
25.0000 g | Freq: Four times a day (QID) | INTRAVENOUS | Status: AC
  Administered 2021-05-07 – 2021-05-08 (×4): 25 g via INTRAVENOUS
  Filled 2021-05-07 (×4): qty 100

## 2021-05-07 NOTE — Progress Notes (Signed)
Physical Therapy Treatment Patient Details Name: Hannah Stein MRN: [id number] DOB: 04/15/1929 Today's Date: 05/07/2021   History of Present Illness Pt is a 87 y/o female admitted 04/11/2021 with abdominal pain, N/V and poor PO intake with weight loss.  CT pelvis showing a signoid colon mass measuring 8.2 x 3.4 cm. S/p paracentesis 11/24. Colonoscopy on 11/25 showed partially obstructive sigmoid mass - negative for malignancy. Deemed too high risk for surgical intervention.  PMH includes PAF, COPD.   PT Comments    Pt tolerated ~30-min sitting in recliner before adamant about return to bed despite max education and encouragement from staff and family. Pt with significant fatigue and weakness, requiring modA to stand and take steps, unable to tolerate additional activity beyond this. Pt remains limited by generalized weakness, decreased activity tolerance, and impaired balance strategies/postural reactions. Continue to recommend SNF-level therapies to maximize functional mobility and independence prior to return home.   Resting SBP 130s, post-standing transfer BP 109/60 (pt denies dizziness, endorses weakness) HR up to 113 SpO2 95% on 2L O2 (DOE 2/4 noted)   Recommendations for follow up therapy are one component of a multi-disciplinary discharge planning process, led by the attending physician.  Recommendations may be updated based on patient status, additional functional criteria and insurance authorization.  Follow Up Recommendations  Skilled nursing-short term rehab (<3 hours/day)     Assistance Recommended at Discharge Frequent or constant Supervision/Assistance  Equipment Recommendations  Rolling walker (2 wheels);BSC/3in1;Wheelchair (measurements PT);Wheelchair cushion (measurements PT);Hospital bed    Recommendations for Other Services       Precautions / Restrictions Precautions Precautions: Fall;Other (comment) Precaution Comments: Urinary incontinence Restrictions Weight  Bearing Restrictions: No     Mobility  Bed Mobility Overal bed mobility: Needs Assistance Bed Mobility: Sit to Supine     Supine to sit: Mod assist Sit to supine: Mod assist   General bed mobility comments: Pt received with hips slidden far down in recliner, unable to scoot herself up requiring maxA+2 to scoot with bed pad. Once EOB, pt told she could lay down and did so with no eccentric control or awareness of almost hitting bed rail, modA for BLE management and assist to reposition trunks/hips. Pt tolerated partial chair position for ~2-min then opting to lower HOB to near flat despite education on importance of upright    Transfers Overall transfer level: Needs assistance Equipment used: 2 person hand held assist Transfers: Sit to/from Stand;Bed to chair/wheelchair/BSC Sit to Stand: Mod assist     Step pivot transfers: Mod assist     General transfer comment: Heavy modA to stand with BUE support on PT's arms, minimal pivotal steps from bed to recliner with modA to maintain trunk elevation    Ambulation/Gait               General Gait Details:  (pt unable to tolerate secondary to fatigue)   Stairs             Wheelchair Mobility    Modified Rankin (Stroke Patients Only)       Balance Overall balance assessment: Needs assistance Sitting-balance support: No upper extremity supported Sitting balance-Leahy Scale: Poor Sitting balance - Comments: unable to weight shift forward in recliner in order to scoot hips up, requiring maxA+2 to scoot back in chair; reliant on UE support and minA once sitting EOB   Standing balance support: During functional activity;Bilateral upper extremity supported Standing balance-Leahy Scale: Poor Standing balance comment: Reliant on BUE support and external assist to maintain  standing balance                            Cognition Arousal/Alertness: Awake/alert Behavior During Therapy: Flat affect Overall  Cognitive Status: Impaired/Different from baseline Area of Impairment: Following commands;Problem solving                       Following Commands: Follows one step commands with increased time     Problem Solving: Requires verbal cues General Comments: WFL for simple tasks; suspect difficulty problem solving related to significant fatigue, pt requiring increased cues for sequencing (i.e. transfer, figuring out how to lower HOB from bed remote)        Exercises      General Comments General comments (skin integrity, edema, etc.): Resting SBP 130s, post-transfer 109/60, HR up to 113; SpO2 95% on 2L O2. Initiated discussion regarding pt's lack of desire for palliative medicine involvment, to which pt reports, "It's up to my daughter" - pt agrees she feels miserable, but is hopeful procedure will solve issue      Pertinent Vitals/Pain Pain Assessment: Faces Faces Pain Scale: Hurts even more Pain Location: lower back Pain Descriptors / Indicators: Discomfort;Grimacing Pain Intervention(s): Monitored during session;Repositioned    Home Living                          Prior Function            PT Goals (current goals can now be found in the care plan section) Progress towards PT goals: Not progressing toward goals - comment (fatigue/weakness)    Frequency    Min 2X/week      PT Plan Current plan remains appropriate    Co-evaluation              AM-PAC PT "6 Clicks" Mobility   Outcome Measure  Help needed turning from your back to your side while in a flat bed without using bedrails?: A Lot Help needed moving from lying on your back to sitting on the side of a flat bed without using bedrails?: A Lot Help needed moving to and from a bed to a chair (including a wheelchair)?: A Lot Help needed standing up from a chair using your arms (e.g., wheelchair or bedside chair)?: A Lot Help needed to walk in hospital room?: Total Help needed climbing 3-5  steps with a railing? : Total 6 Click Score: 10    End of Session Equipment Utilized During Treatment: Gait belt;Oxygen Activity Tolerance: Patient limited by fatigue Patient left: in bed;with call bell/phone within reach;with bed alarm set Nurse Communication: Mobility status PT Visit Diagnosis: Unsteadiness on feet (R26.81);Other abnormalities of gait and mobility (R26.89);Difficulty in walking, not elsewhere classified (R26.2);Muscle weakness (generalized) (M62.81)     Time: 2585-2778 PT Time Calculation (min) (ACUTE ONLY): 20 min  Charges:  $Therapeutic Activity: 8-22 mins                     Mabeline Caras, PT, DPT Acute Rehabilitation Services  Pager [phone] Office [phone]  Derry Lory 05/07/2021, 2:41 PM

## 2021-05-07 NOTE — Progress Notes (Signed)
Physical Therapy Treatment Patient Details Name: Hannah Stein MRN: [id number] DOB: Jan 13, 1929 Today's Date: 05/07/2021   History of Present Illness Pt is a 87 y/o female admitted 04/21/2021 with abdominal pain, N/V and poor PO intake with weight loss.  CT pelvis showing a signoid colon mass measuring 8.2 x 3.4 cm. S/p paracentesis 11/24. Colonoscopy on 11/25 showed partially obstructive sigmoid mass - negative for malignancy. Deemed too high risk for surgical intervention.  PMH includes PAF, COPD.   PT Comments    Pt limited by c/o increased fatigue and "feeling weak" this session. Pt requiring modA for brief bouts of activity, unable to tolerate ambulation this session. Pt remains limited by generalized weakness, decreased activity tolerance, and impaired balance strategies/postural reactions. Continue to recommend SNF-level therapies to maximize functional mobility and independence prior to return home.    Recommendations for follow up therapy are one component of a multi-disciplinary discharge planning process, led by the attending physician.  Recommendations may be updated based on patient status, additional functional criteria and insurance authorization.  Follow Up Recommendations  Skilled nursing-short term rehab (<3 hours/day)     Assistance Recommended at Discharge Frequent or constant Supervision/Assistance  Equipment Recommendations  Rolling walker (2 wheels)    Recommendations for Other Services       Precautions / Restrictions Precautions Precautions: Fall;Other (comment) Precaution Comments: Urinary incontinence Restrictions Weight Bearing Restrictions: No     Mobility  Bed Mobility Overal bed mobility: Needs Assistance Bed Mobility: Supine to Sit     Supine to sit: Mod assist     General bed mobility comments: Cues for BLE movement to EOB and use of bed rails, modA for trunk elevation    Transfers Overall transfer level: Needs assistance Equipment used:  Rolling walker (2 wheels) Transfers: Sit to/from Stand;Bed to chair/wheelchair/BSC Sit to Stand: Mod assist     Step pivot transfers: Min assist     General transfer comment: Successful stand on second attempt with modA for trunk elevation, assist to stabilize RW; pivotal steps to recliner with minA for stability and RW management; poor eccentric control to sitting as pt with significant fatigue    Ambulation/Gait               General Gait Details:  (pt unable to tolerate secondary to fatigue)   Stairs             Wheelchair Mobility    Modified Rankin (Stroke Patients Only)       Balance Overall balance assessment: Needs assistance Sitting-balance support: No upper extremity supported Sitting balance-Leahy Scale: Poor Sitting balance - Comments: Initial fair balance sitting EOB without UE support; once in recliner, heavy reliance on BUE support to anteriorly weight shift trunk and maintain static sitting without back support   Standing balance support: During functional activity;Bilateral upper extremity supported;Reliant on assistive device for balance Standing balance-Leahy Scale: Poor Standing balance comment: Reliant on BUE support and external assist to maintain standing balance                            Cognition Arousal/Alertness: Awake/alert Behavior During Therapy: Flat affect Overall Cognitive Status: Within Functional Limits for tasks assessed                                 General Comments: WFL for simple tasks, but significant fatigue limiting participation  Exercises      General Comments General comments (skin integrity, edema, etc.): SpO2 95% on 2L O2 when pleth reliable. Pt requiring max encouragement to maintain sitting in recliner as opposed to returning to bed; increased time repositioning for comfort and pressure relief in chair, educ on importance of sitting upright and not laying flat       Pertinent Vitals/Pain Pain Assessment: Faces Faces Pain Scale: Hurts little more Pain Location: stomach/abdomen Pain Descriptors / Indicators: Discomfort;Grimacing Pain Intervention(s): Monitored during session;Limited activity within patient's tolerance    Home Living                          Prior Function            PT Goals (current goals can now be found in the care plan section) Progress towards PT goals: Not progressing toward goals - comment (increased fatigue this session)    Frequency    Min 2X/week      PT Plan Current plan remains appropriate    Co-evaluation              AM-PAC PT "6 Clicks" Mobility   Outcome Measure  Help needed turning from your back to your side while in a flat bed without using bedrails?: A Little Help needed moving from lying on your back to sitting on the side of a flat bed without using bedrails?: A Lot Help needed moving to and from a bed to a chair (including a wheelchair)?: A Little Help needed standing up from a chair using your arms (e.g., wheelchair or bedside chair)?: A Lot Help needed to walk in hospital room?: A Lot Help needed climbing 3-5 steps with a railing? : Total 6 Click Score: 13    End of Session Equipment Utilized During Treatment: Gait belt;Oxygen Activity Tolerance: Patient limited by fatigue Patient left: in chair;with call bell/phone within reach;with chair alarm set;with family/visitor present Nurse Communication: Mobility status PT Visit Diagnosis: Unsteadiness on feet (R26.81);Other abnormalities of gait and mobility (R26.89);Difficulty in walking, not elsewhere classified (R26.2);Muscle weakness (generalized) (M62.81)     Time: 5465-0354 PT Time Calculation (min) (ACUTE ONLY): 26 min  Charges:  $Therapeutic Activity: 23-37 mins                     Mabeline Caras, PT, DPT Acute Rehabilitation Services  Pager [phone] Office [phone]  Derry Lory 05/07/2021, 1:48  PM

## 2021-05-07 NOTE — Progress Notes (Signed)
Patient ID: Hannah Stein, female   DOB: April 28, 1929, 87 y.o.   MRN: [id number]  PROGRESS NOTE    Hannah Stein  YBO:[id number] DOB: 10-12-1928 DOA: 04/14/2021 PCP: Kelton Pillar, MD   Brief Narrative:  87 y.o. female with medical history significant of PAF on xarelto, COPD,  who presented with abdominal pain, nausea and vomiting with poor oral intake and weight loss.  On presentation, CT of the abdomen and pelvis showed abnormal circumferential wall thickening in the sigmoid colon possible mass measuring 8.2 x 3.4 cm.  Along with 2.9 cm infrarenal abdominal arctic aneurysm; cirrhosis of liver with large ascites.  She underwent paracentesis with removal of 4 L of fluid; cytology was negative for malignancy.  GI consulted: Underwent colonoscopy on 04/27/2021: Was found to have partially obstructive sigmoid mass; pathology negative for malignancy.  General surgery subsequently consulted as well.  Assessment & Plan:   Failure to thrive in adult/generalized weakness -Likely secondary to colon mass (? Malignancy) and cirrhosis of liver -Palliative care evaluation appreciated.  Currently remains full code.  Patient's overall prognosis is very poor. -PT/OT recommending SNF placement.  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  Surgery would be a last resort with high risk of morbidity and mortality. -Colonoscopy had revealed sigmoid mass: Pathology was negative for malignancy.  GI planning for flexible sigmoidoscopy and biopsy with possible stent placement on 05/21/2021.  Small right-sided pneumothorax -Prior hospitalist curb sided with Dr. Olalere/PCCM who had recommended to keep patient on oxygen to keep oxygen saturation 100%. -Chest x-ray on  05/06/2021 had shown small pneumothorax essentially unchanged  Cirrhosis of liver with large ascites -Status post diagnostic and therapeutic paracentesis with removal of 4 L of fluid on 04/30/2021.  Cytology negative for malignancy. -Might need repeat paracentesis.  Currently not on diuretics because of AKI.  AKI on CKD stage IIIa Acute metabolic acidosis -Baseline creatinine around 1.1.  Presented with creatinine of 1.61.  Creatinine worsening to 2.17 today. -Currently on IV fluids.  Will DC IV fluids.  Give few doses of IV albumin.  Hyperkalemia -Resolved  Paroxysmal A. Fib -Currently rate controlled.  Cardizem stopped due to junctional rhythm. -Xarelto on hold until plans from GI/general surgery have been finalized.  Continue Lovenox for now.  Elevated troponin -Possibly from demand ischemia.  Troponins did not significantly trend up.  Echo showed EF of 60 to 65% with no regional wall motion abnormalities -No further work-up needed  Leukocytosis -Improving  History of hypertension -Blood pressure currently stable.  Not on any antihypertensive currently  Infrarenal abdominal aortic aneurysm -2.9 cm.  Outpatient follow-up  Goals of care -As discussed above.  Palliative care team following. -I think that patient's condition is very poor and she should consider total comfort measures/hospice.   DVT prophylaxis: SCDs Code Status: Full Family Communication: None at bedside Disposition Plan: Status is: Inpatient  Remains inpatient appropriate because: Patient is clinically very ill with cirrhosis of liver with ascites/sigmoid mass/worsening kidney function  Consultants: GI/general surgery/palliative care  Procedures: Colonoscopy/paracentesis  Antimicrobials: None   Subjective: Patient seen and examined at bedside.  Feels very weak and does not feel well.  Does not elaborate on symptoms.  Does not participate in conversation much.  No overnight fever, vomiting  reported.  Objective: Vitals:   05/06/21 1941  05/06/21 2313 05/07/21 0437 05/07/21 0826  BP: (!) 123/47 (!) 112/50 (!) 115/50 (!) 134/53  Pulse: 96 89 95 95  Resp: 20 17 20 19   Temp: 97.8 F (36.6 C) 98.4 F (36.9 C) 98.6 F (37 C) 98.2 F (36.8 C)  TempSrc: Oral Oral Oral Axillary  SpO2: 97% 97% 98% 91%  Weight:      Height:        Intake/Output Summary (Last 24 hours) at 05/07/2021 1108 Last data filed at 05/07/2021 0500 Gross per 24 hour  Intake --  Output 1350 ml  Net -1350 ml   Filed Weights   05/02/21 0305 05/04/21 0406 05/05/21 0337  Weight: 56.6 kg 55 kg 54.6 kg    Examination:  General exam: Looks chronically ill and deconditioned.  Currently on 2 L oxygen via nasal cannula. Respiratory system: Bilateral decreased breath sounds at bases with scattered crackles Cardiovascular system: S1 & S2 heard, Rate controlled Gastrointestinal system: Abdomen is distended, soft and nontender.  Bowel sounds are heard Extremities: Bilateral lower extremity edema present; no clubbing Central nervous system: Awake, extremely slow to respond, very poor historian.  No focal neurological deficits. Moving extremities Skin: No rashes, lesions or ulcers Psychiatry: Affect is mostly flat.  Does not participate in conversation much with   Data Reviewed: I have personally reviewed following labs and imaging studies  CBC: Recent Labs  Lab 04/22/2021 1104 05/02/21 0210 05/03/21 0132 05/04/21 0036 05/05/21 0202  WBC 14.9* 15.7* 10.5 16.3* 13.9*  NEUTROABS 12.7* 13.3*  --   --   --   HGB 16.7* 16.3* 13.9 16.5* 15.8*  HCT 53.7* 50.7* 43.1 52.7* 50.1*  MCV 88.3 86.7 86.9 88.0 87.0  PLT 324 351 258 415* 846   Basic Metabolic Panel: Recent Labs  Lab 05/02/21 0210 05/03/21 0132 05/04/21 0036 05/04/21 1327 05/05/21 0202 05/06/21 0116  NA 138 138 138  --  144 144  K 5.3* 4.0 5.2* 4.5 4.5 4.1  CL 105 108 108  --  114* 117*  CO2 23 23 18*  --  19* 19*  GLUCOSE 147* 108* 134*  --   113* 96  BUN 42* 39* 38*  --  48* 55*  CREATININE 1.70* 1.50* 1.50*  --  2.14* 2.17*  CALCIUM 9.3 8.3* 8.6*  --  8.7* 8.0*   GFR: Estimated Creatinine Clearance: 14.3 mL/min (A) (by C-G formula based on SCr of 2.17 mg/dL (H)). Liver Function Tests: No results for input(s): AST, ALT, ALKPHOS, BILITOT, PROT, ALBUMIN in the last 168 hours. No results for input(s): LIPASE, AMYLASE in the last 168 hours. No results for input(s): AMMONIA in the last 168 hours. Coagulation Profile: Recent Labs  Lab 05/03/21 1256  INR 1.1   Cardiac Enzymes: No results for input(s): CKTOTAL, CKMB, CKMBINDEX, TROPONINI in the last 168 hours. BNP (last 3 results) No results for input(s): PROBNP in the last 8760 hours. HbA1C: No results for input(s): HGBA1C in the last 72 hours. CBG: No results for input(s): GLUCAP in the last 168 hours. Lipid Profile: No results for input(s): CHOL, HDL, LDLCALC, TRIG, CHOLHDL, LDLDIRECT in the last 72 hours. Thyroid Function Tests: No results for input(s): TSH, T4TOTAL, FREET4, T3FREE, THYROIDAB in the last 72 hours. Anemia Panel: No results for input(s): VITAMINB12, FOLATE, FERRITIN, TIBC, IRON, RETICCTPCT in the last 72 hours. Sepsis Labs: No results for input(s): PROCALCITON, LATICACIDVEN in the last 168 hours.  Recent Results (from the past 240 hour(s))  Resp Panel by RT-PCR (Flu A&B, Covid) Nasopharyngeal  Swab     Status: None   Collection Time: 04/22/2021  2:55 PM   Specimen: Nasopharyngeal Swab; Nasopharyngeal(NP) swabs in vial transport medium  Result Value Ref Range Status   SARS Coronavirus 2 by RT PCR NEGATIVE NEGATIVE Final    Comment: (NOTE) SARS-CoV-2 target nucleic acids are NOT DETECTED.  The SARS-CoV-2 RNA is generally detectable in upper respiratory specimens during the acute phase of infection. The lowest concentration of SARS-CoV-2 viral copies this assay can detect is 138 copies/mL. A negative result does not preclude SARS-Cov-2 infection and  should not be used as the sole basis for treatment or other patient management decisions. A negative result may occur with  improper specimen collection/handling, submission of specimen other than nasopharyngeal swab, presence of viral mutation(s) within the areas targeted by this assay, and inadequate number of viral copies(<138 copies/mL). A negative result must be combined with clinical observations, patient history, and epidemiological information. The expected result is Negative.  Fact Sheet for Patients:  EntrepreneurPulse.com.au  Fact Sheet for Healthcare Providers:  IncredibleEmployment.be  This test is no t yet approved or cleared by the [place] FDA and  has been authorized for detection and/or diagnosis of SARS-CoV-2 by FDA under an Emergency Use Authorization (EUA). This EUA will remain  in effect (meaning this test can be used) for the duration of the COVID-19 declaration under Section 564(b)(1) of the Act, 21 U.S.C.section 360bbb-3(b)(1), unless the authorization is terminated  or revoked sooner.       Influenza A by PCR NEGATIVE NEGATIVE Final   Influenza B by PCR NEGATIVE NEGATIVE Final    Comment: (NOTE) The Xpert Xpress SARS-CoV-2/FLU/RSV plus assay is intended as an aid in the diagnosis of influenza from Nasopharyngeal swab specimens and should not be used as a sole basis for treatment. Nasal washings and aspirates are unacceptable for Xpert Xpress SARS-CoV-2/FLU/RSV testing.  Fact Sheet for Patients: EntrepreneurPulse.com.au  Fact Sheet for Healthcare Providers: IncredibleEmployment.be  This test is not yet approved or cleared by the [place] FDA and has been authorized for detection and/or diagnosis of SARS-CoV-2 by FDA under an Emergency Use Authorization (EUA). This EUA will remain in effect (meaning this test can be used) for the duration of the COVID-19 declaration  under Section 564(b)(1) of the Act, 21 U.S.C. section 360bbb-3(b)(1), unless the authorization is terminated or revoked.  Performed at [facility], [place] [address], [place], [place] [address]   Aerobic/Anaerobic Culture w Gram Stain (surgical/deep wound)     Status: None   Collection Time: 04/30/21 11:23 AM   Specimen: PATH Cytology Peritoneal fluid  Result Value Ref Range Status   Specimen Description PERITONEAL FLUID  Final   Special Requests NONE  Final   Gram Stain   Final    RARE WBC PRESENT,BOTH PMN AND MONONUCLEAR NO ORGANISMS SEEN    Culture   Final    No growth aerobically or anaerobically. Performed at [facility], [place] [address], [place], [place] [address]    Report Status 05/05/2021 FINAL  Final  Surgical PCR screen     Status: None   Collection Time: 04/30/21  8:54 PM   Specimen: Nasal Mucosa; Nasal Swab  Result Value Ref Range Status   MRSA, PCR NEGATIVE NEGATIVE Final   Staphylococcus aureus NEGATIVE NEGATIVE Final    Comment: (NOTE) The Xpert SA Assay (FDA approved for NASAL specimens in patients 5 years of age and older), is one component of a comprehensive surveillance program. It is  not intended to diagnose infection nor to guide or monitor treatment. Performed at [facility], [place] [address] [place], [place] [address]          Radiology Studies: DG CHEST PORT 1 VIEW  Result Date: 05/06/2021 CLINICAL DATA:  Pneumothorax EXAM: PORTABLE CHEST 1 VIEW COMPARISON:  Chest radiograph 05/05/2021 FINDINGS: The cardiomediastinal silhouette is stable. There is a small right apical pneumothorax, likely not significantly changed in size. Additional lines projecting over the right hemithorax likely reflect skin folds. Hyperlucency in the upper lungs again likely reflects emphysema. Patchy opacities in the lung bases likely reflects atelectasis. There is a possible small right pleural effusion. There is no acute osseous abnormality. IMPRESSION:  1. Small right apical pneumothorax, likely not significantly changed in size. 2. Bibasilar atelectasis and small right pleural effusion. Electronically Signed   By: Valetta Mole M.D.   On: 05/06/2021 11:36        Scheduled Meds:  cholecalciferol  2,000 Units Oral BID   enoxaparin (LOVENOX) injection  55 mg Subcutaneous Q24H   feeding supplement  237 mL Oral BID BM   pantoprazole (PROTONIX) IV  40 mg Intravenous Q24H   vitamin B-12  500 mcg Oral Daily   Continuous Infusions:  sodium chloride 100 mL/hr at 05/05/21 1640   sodium chloride            Aline August, MD [facility] 05/07/2021, 11:08 AM

## 2021-05-07 NOTE — Progress Notes (Signed)
[name] 12:54 PM  Subjective: Patient seen and examined and discussed with her son-in-law and briefly discussed with hospital team and she is actually just a little stronger than yesterday and her case discussed with her nurse as well and she has not had a bowel movement since Sunday and does tend to vomit every time she tries to eat and has no new complaints  Objective: Vital signs stable afebrile no acute distress abdomen is soft nontender slight distention labs reviewed  Assessment: Probable sigmoid mass with partial obstruction  Plan: The family is aware that stenting may not make a difference and they may decide on comfort care only at some point but for now he would like for me to proceed with a flex sig possible biopsy and possible stenting with further work-up and plans pending those findings and the procedure was rediscussed  [facility]  office [phone] After 5PM or if no answer call [phone]

## 2021-05-07 NOTE — Progress Notes (Deleted)
Patient had 500cc of urine output during night shift. Bladder scan performed, patient only showed 87cc in her bladder. Patient is in end stage renal failure and is scheduled to start dialysis today. Fuller Canada, RN

## 2021-05-07 NOTE — Progress Notes (Signed)
Patient only taking a few sips of clears today, oral care has been done see flow sheet for documentation. Patient with 176ml amber urine output today. Bladder scan volume 11ml. Patient call bell within reach. Will monitor patient. [place], Bettina Gavia RN

## 2021-05-07 DEATH — deceased

## 2021-05-08 ENCOUNTER — Encounter (HOSPITAL_COMMUNITY): Payer: Self-pay | Admitting: Family Medicine

## 2021-05-08 ENCOUNTER — Inpatient Hospital Stay (HOSPITAL_COMMUNITY): Payer: Medicare Other

## 2021-05-08 ENCOUNTER — Encounter (HOSPITAL_COMMUNITY): Admission: EM | Disposition: E | Payer: Self-pay | Source: Home / Self Care | Attending: Family Medicine

## 2021-05-08 ENCOUNTER — Inpatient Hospital Stay (HOSPITAL_COMMUNITY): Payer: Medicare Other | Admitting: General Practice

## 2021-05-08 ENCOUNTER — Encounter (HOSPITAL_COMMUNITY): Payer: Medicare Other

## 2021-05-08 DIAGNOSIS — K6389 Other specified diseases of intestine: Secondary | ICD-10-CM | POA: Diagnosis not present

## 2021-05-08 DIAGNOSIS — R188 Other ascites: Secondary | ICD-10-CM

## 2021-05-08 DIAGNOSIS — R109 Unspecified abdominal pain: Secondary | ICD-10-CM | POA: Diagnosis not present

## 2021-05-08 DIAGNOSIS — R627 Adult failure to thrive: Secondary | ICD-10-CM | POA: Diagnosis not present

## 2021-05-08 DIAGNOSIS — N179 Acute kidney failure, unspecified: Secondary | ICD-10-CM | POA: Diagnosis not present

## 2021-05-08 HISTORY — PX: FLEXIBLE SIGMOIDOSCOPY: SHX5431

## 2021-05-08 HISTORY — PX: IR PARACENTESIS: IMG2679

## 2021-05-08 HISTORY — PX: COLONIC STENT PLACEMENT: SHX5542

## 2021-05-08 LAB — COMPREHENSIVE METABOLIC PANEL
ALT: 15 U/L (ref 0–44)
AST: 16 U/L (ref 15–41)
Albumin: 2.5 g/dL — ABNORMAL LOW (ref 3.5–5.0)
Alkaline Phosphatase: 97 U/L (ref 38–126)
Anion gap: 13 (ref 5–15)
BUN: 68 mg/dL — ABNORMAL HIGH (ref 8–23)
CO2: 15 mmol/L — ABNORMAL LOW (ref 22–32)
Calcium: 7.8 mg/dL — ABNORMAL LOW (ref 8.9–10.3)
Chloride: 122 mmol/L — ABNORMAL HIGH (ref 98–111)
Creatinine, Ser: 2.57 mg/dL — ABNORMAL HIGH (ref 0.44–1.00)
GFR, Estimated: 17 mL/min — ABNORMAL LOW (ref >60)
Glucose, Bld: 87 mg/dL (ref 70–99)
Potassium: 4.1 mmol/L (ref 3.5–5.1)
Sodium: 150 mmol/L — ABNORMAL HIGH (ref 135–145)
Total Bilirubin: 1.6 mg/dL — ABNORMAL HIGH (ref 0.3–1.2)
Total Protein: 5 g/dL — ABNORMAL LOW (ref 6.5–8.1)

## 2021-05-08 LAB — BODY FLUID CELL COUNT WITH DIFFERENTIAL
Eos, Fluid: 0 %
Lymphs, Fluid: 3 %
Monocyte-Macrophage-Serous Fluid: 9 % — ABNORMAL LOW (ref 50–90)
Neutrophil Count, Fluid: 88 % — ABNORMAL HIGH (ref 0–25)
Total Nucleated Cell Count, Fluid: 3125 cu mm — ABNORMAL HIGH (ref 0–1000)

## 2021-05-08 LAB — MAGNESIUM: Magnesium: 2.2 mg/dL (ref 1.7–2.4)

## 2021-05-08 LAB — CBC
HCT: 42 % (ref 36.0–46.0)
Hemoglobin: 13.1 g/dL (ref 12.0–15.0)
MCH: 27.5 pg (ref 26.0–34.0)
MCHC: 31.2 g/dL (ref 30.0–36.0)
MCV: 88.1 fL (ref 80.0–100.0)
Platelets: 316 10*3/uL (ref 150–400)
RBC: 4.77 MIL/uL (ref 3.87–5.11)
RDW: 14.9 % (ref 11.5–15.5)
WBC: 23.9 10*3/uL — ABNORMAL HIGH (ref 4.0–10.5)
nRBC: 0 % (ref 0.0–0.2)

## 2021-05-08 LAB — GRAM STAIN

## 2021-05-08 LAB — ALBUMIN, PLEURAL OR PERITONEAL FLUID: Albumin, Fluid: <1.5 g/dL

## 2021-05-08 LAB — PROTEIN, PLEURAL OR PERITONEAL FLUID: Total protein, fluid: <3 g/dL

## 2021-05-08 LAB — HEPARIN ANTI-XA: Heparin LMW: 0.54 IU/mL

## 2021-05-08 SURGERY — SIGMOIDOSCOPY, FLEXIBLE
Anesthesia: Monitor Anesthesia Care

## 2021-05-08 MED ORDER — LIDOCAINE 2% (20 MG/ML) 5 ML SYRINGE
INTRAMUSCULAR | Status: DC | PRN
  Administered 2021-05-08: 40 mg via INTRAVENOUS

## 2021-05-08 MED ORDER — ENOXAPARIN SODIUM 60 MG/0.6ML IJ SOSY: 60.0000 mg | PREFILLED_SYRINGE | INTRAMUSCULAR | Status: DC

## 2021-05-08 MED ORDER — PROPOFOL 500 MG/50ML IV EMUL
INTRAVENOUS | Status: DC | PRN
  Administered 2021-05-08: 100 ug/kg/min via INTRAVENOUS

## 2021-05-08 MED ORDER — ALBUMIN HUMAN 25 % IV SOLN
25.0000 g | Freq: Once | INTRAVENOUS | Status: AC
  Administered 2021-05-08: 25 g via INTRAVENOUS
  Filled 2021-05-08: qty 100

## 2021-05-08 MED ORDER — POLYETHYLENE GLYCOL 3350 17 G PO PACK: 17.0000 g | PACK | Freq: Every day | ORAL | Status: DC

## 2021-05-08 MED ORDER — DEXTROSE 5 % IV SOLN: INTRAVENOUS | Status: DC

## 2021-05-08 MED ORDER — LIDOCAINE HCL 1 % IJ SOLN
INTRAMUSCULAR | Status: AC
  Administered 2021-05-08: 10 mL
  Filled 2021-05-08: qty 20

## 2021-05-08 NOTE — Progress Notes (Signed)
Hannah Stein 8:45 AM  Subjective: Patient with an infiltrated left IV at otherwise no new complaints and actually a little more awake this morning  Objective: Vital signs stable afebrile exam please see preassessment evaluation labs reviewed  Assessment: Probable colon obstruction  Plan: Okay to proceed with flex sig with biopsy and possible stenting with anesthesia assistance  [facility]: [facility] E  office [phone] After 5PM or if no answer call [phone]

## 2021-05-08 NOTE — Progress Notes (Signed)
Patient arrived back from IR dressing to left abdomen clean dry and intact. Vital signs obtained call bell with in reach. Billi Bright, Bettina Gavia RN

## 2021-05-08 NOTE — Progress Notes (Signed)
ANTICOAGULATION CONSULT NOTE - Follow Up Consult  Pharmacy Consult for Lovenox Indication: atrial fibrillation  No Known Allergies  Patient Measurements: Height: 5\' 3"  (160 cm) Weight: 54.6 kg (120 lb 5.9 oz) IBW/kg (Calculated) : 52.4 Lovenox Dosing Weight: 54.6 kg  Vital Signs: Temp: 97.7 F (36.5 C) (12/02 2000) Temp Source: Axillary (12/02 2000) BP: 115/43 (12/02 2000) Pulse Rate: 84 (12/02 2000)  Labs: Recent Labs    05/06/21 0116 05/14/2021 0303 06/03/2021 0458 05/26/2021 2040  HGB  --   --  13.1  --   HCT  --   --  42.0  --   PLT  --   --  316  --   HEPRLOWMOCWT  --   --   --  0.54  CREATININE 2.17* 2.57*  --   --      Estimated Creatinine Clearance: 12 mL/min (A) (by C-G formula based on SCr of 2.57 mg/dL (H)).  Assessment: 87 yo female with paroxysmal atrial fibrillation on Xarelto 15 mg daily prior to admission, last dose 11/22 1400. Pharmacy consulted for treatment Lovenox dosing.  Xarelto on hold for procedures.  Anti-Xa level is just below goal at 0.54. It was drawn ~40 min late. No bleeding noted. SCr has trended up to 2.57.  Goal of Therapy:  Anti-Xa level 0.6-1 units/ml 4hrs after LMWH dose given Monitor platelets by anticoagulation protocol: Yes   Plan:  Increase Lovenox to 60 mg SQ daily CBC at least every 3 days while on Lovenox Consider checking another level if renal function continues to worsen Xarelto on hold - f/u ability to resume  Thank you for involving pharmacy in this patient's care.  Renold Genta, PharmD, BCPS Clinical Pharmacist Clinical phone for 05/28/2021 until 10p is [phone] 05/13/2021 9:26 PM  **Pharmacist phone directory can be found on [email or web address] listed under [place]**

## 2021-05-08 NOTE — Progress Notes (Signed)
Pt w/ very small amount of dark urine in canister (un-measurable). Bladder scan revealed 152mLs in bladder. Pt currently not in distress and denies urge to go at this time. Remains NPO for procedure in AM, and also did not have any oral intake this pm.

## 2021-05-08 NOTE — Progress Notes (Signed)
Patient wanted a sip of water because she stated her "mouth was still dry" oral care had recently been performed. Upon swallowing water patient began to cough and it appeared patient had difficulty swallowing the water at this time. Patient mouth suctioned. Family now at bedside and updated. Ala Kratz, Bettina Gavia RN

## 2021-05-08 NOTE — Anesthesia Postprocedure Evaluation (Signed)
Anesthesia Post Note  Patient: Hannah Stein  Procedure(s) Performed: [place] PLACEMENT     Patient location during evaluation: PACU Anesthesia Type: MAC Level of consciousness: awake and alert Pain management: pain level controlled Vital Signs Assessment: post-procedure vital signs reviewed and stable Respiratory status: spontaneous breathing, nonlabored ventilation, respiratory function stable and patient connected to nasal cannula oxygen Cardiovascular status: stable and blood pressure returned to baseline Postop Assessment: no apparent nausea or vomiting Anesthetic complications: no   No notable events documented.  Last Vitals:  Vitals:   05/31/2021 1528 06/03/2021 1548  BP: (!) 125/47 (!) 126/52  Pulse:  92  Resp:  19  Temp:  (!) 36.4 C  SpO2: 94% 90%    Last Pain:  Vitals:   06/05/2021 1548  TempSrc: Axillary  PainSc:                  Catalina Gravel

## 2021-05-08 NOTE — Transfer of Care (Signed)
Immediate Anesthesia Transfer of Care Note  Patient: Hannah Stein  Procedure(s) Performed: [place] PLACEMENT  Patient Location: PACU  Anesthesia Type:MAC  Level of Consciousness: awake and alert   Airway & Oxygen Therapy: Patient Spontanous Breathing and Patient connected to nasal cannula oxygen  Post-op Assessment: Report given to RN and Post -op Vital signs reviewed and stable  Post vital signs: Reviewed and stable  Last Vitals:  Vitals Value Taken Time  BP 116/62 05/24/2021 1025  Temp    Pulse 90 06/06/2021 1032  Resp 18 05/07/2021 1032  SpO2 97 % 05/07/2021 1032  Vitals shown include unvalidated device data.  Last Pain:  Vitals:   05/07/2021 0748  TempSrc: Temporal  PainSc: 0-No pain         Complications: No notable events documented.

## 2021-05-08 NOTE — Progress Notes (Signed)
Patient ID: DERRIAN RODAK, female   DOB: 01-03-1929, 87 y.o.   MRN: [id number]  PROGRESS NOTE    LANIESHA DAS  QVZ:[id number] DOB: [birth year] DOA: 04/21/2021 PCP: Kelton Pillar, MD   Brief Narrative:  87 y.o. female with medical history significant of PAF on xarelto, COPD,  who presented with abdominal pain, nausea and vomiting with poor oral intake and weight loss.  On presentation, CT of the abdomen and pelvis showed abnormal circumferential wall thickening in the sigmoid colon possible mass measuring 8.2 x 3.4 cm.  Along with 2.9 cm infrarenal abdominal arctic aneurysm; cirrhosis of liver with large ascites.  She underwent paracentesis with removal of 4 L of fluid; cytology was negative for malignancy.  GI consulted: Underwent colonoscopy on 04/09/2021: Was found to have partially obstructive sigmoid mass; pathology negative for malignancy.  General surgery subsequently consulted as well.  Assessment & Plan:   Failure to thrive in adult/generalized weakness -Likely secondary to colon mass (? Malignancy) and cirrhosis of liver -Palliative care evaluation appreciated.  Currently remains full code.  Patient's overall prognosis is very poor. -PT/OT recommending SNF placement.  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  Surgery would be a last resort with high risk of morbidity and mortality. -Colonoscopy had revealed sigmoid mass: Pathology was negative for malignancy.  GI planning for flexible sigmoidoscopy and biopsy with possible stent placement on 05/21/2021.  Small right-sided pneumothorax -Prior hospitalist curb sided with Dr. Olalere/PCCM who had recommended to keep patient on oxygen to keep oxygen saturation 100%. -Chest x-ray on 05/06/2021  had shown small pneumothorax essentially unchanged  Cirrhosis of liver with large ascites -Status post diagnostic and therapeutic paracentesis with removal of 4 L of fluid on 04/30/2021.  Cytology negative for malignancy. -Might need repeat paracentesis.  Currently not on diuretics because of AKI.  AKI on CKD stage IIIa Acute metabolic acidosis -Baseline creatinine around 1.1.  Presented with creatinine of 1.61.  Creatinine worsening to 2.57 today. -Acidosis worsening to 15.  IV fluids was discontinued on 05/07/2021 and she was given 4 doses of IV albumin. -Patient is probably going to hepatorenal syndrome.  Urine output is poor.  Monitor creatinine.  Patient is a very poor candidate for dialysis -Start D5 at 75 cc an hour  Hyperkalemia -Resolved  Paroxysmal A. Fib -Currently rate controlled.  Cardizem stopped due to junctional rhythm. -Xarelto on hold until plans from GI/general surgery have been finalized.  Continue Lovenox for now.  Elevated troponin -Possibly from demand ischemia.  Troponins did not significantly trend up.  Echo showed EF of 60 to 65% with no regional wall motion abnormalities -No further work-up needed  Leukocytosis -Worsening.  History of hypertension -Blood pressure currently stable.  Not on any antihypertensive currently  Infrarenal abdominal aortic aneurysm -2.9 cm.  Outpatient follow-up  Goals of care -As discussed above.  Palliative care team following. -I think that patient's condition is very poor and she should consider total comfort measures/hospice.   DVT prophylaxis: SCDs Code Status: Full Family Communication: None at bedside Disposition Plan: Status is: Inpatient  Remains inpatient appropriate because: Patient is clinically very ill with cirrhosis of liver with ascites/sigmoid mass/worsening kidney function  Consultants: GI/general surgery/palliative care  Procedures: Colonoscopy/paracentesis  Antimicrobials:  None   Subjective: Patient seen and examined at bedside.  Extremely poor historian.  No overnight agitation, fever or vomiting reported.  Objective: Vitals:   05/07/21 2310 06/03/2021 0314 05/23/2021 0716 06/05/2021 0748  BP: (!) 117/49 (!) 114/46 (!) 113/51 (!) 128/49  Pulse: 89 91 85 93  Resp: 20 20 20 20   Temp: 97.9 F (36.6 C) 98 F (36.7 C) 97.6 F (36.4 C) 98.6 F (37 C)  TempSrc: Oral Oral Oral Temporal  SpO2: 99% 98% 97% 100%  Weight:    54.6 kg  Height:    5\' 3"  (1.6 m)    Intake/Output Summary (Last 24 hours) at 06/05/2021 0809 Last data filed at 05/18/2021 0429 Gross per 24 hour  Intake 4530.21 ml  Output 150 ml  Net 4380.21 ml    Filed Weights   05/04/21 0406 05/05/21 0337 05/20/2021 0748  Weight: 55 kg 54.6 kg 54.6 kg    Examination:  General exam: Looks chronically ill and deconditioned.  On 2 L oxygen via nasal cannula.  No distress.   Respiratory system: Decreased breath sounds at bases bilaterally with some crackles  cardiovascular system: Rate controlled, S1-S2 heard gastrointestinal system: Abdomen is mildly distended, soft and nontender.  Normal bowel sounds heard  extremities: No cyanosis; lower extremity edema present bilaterally present left arm and forearm are swollen Central nervous system: Wakes up slightly; extremely slow to respond, very poor historian.  No focal neurological deficits.  Moves extremities Skin: Ecchymosis present on left arm psychiatry: Hardly participates in any conversation.  Extremely flat affect.  Data Reviewed: I have personally reviewed following labs and imaging studies  CBC: Recent Labs  Lab 04/10/2021 1104 05/02/21 0210 05/03/21 0132 05/04/21 0036 05/05/21 0202 05/20/2021 0458  WBC 14.9* 15.7* 10.5 16.3* 13.9* 23.9*  NEUTROABS 12.7* 13.3*  --   --   --   --   HGB 16.7* 16.3* 13.9 16.5* 15.8* 13.1  HCT 53.7* 50.7* 43.1 52.7* 50.1* 42.0  MCV 88.3 86.7 86.9 88.0 87.0 88.1  PLT 324 351 258 415* 357 316    Basic  Metabolic Panel: Recent Labs  Lab 05/03/21 0132 05/04/21 0036 05/04/21 1327 05/05/21 0202 05/06/21 0116 06/06/2021 0303  NA 138 138  --  144 144 150*  K 4.0 5.2* 4.5 4.5 4.1 4.1  CL 108 108  --  114* 117* 122*  CO2 23 18*  --  19* 19* 15*  GLUCOSE 108* 134*  --  113* 96 87  BUN 39* 38*  --  48* 55* 68*  CREATININE 1.50* 1.50*  --  2.14* 2.17* 2.57*  CALCIUM 8.3* 8.6*  --  8.7* 8.0* 7.8*  MG  --   --   --   --   --  2.2    GFR: Estimated Creatinine Clearance: 12 mL/min (A) (by C-G formula based on SCr of 2.57 mg/dL (H)). Liver Function Tests: Recent Labs  Lab 06/05/2021 0303  AST 16  ALT 15  ALKPHOS 97  BILITOT 1.6*  PROT 5.0*  ALBUMIN 2.5*   No results for input(s): LIPASE, AMYLASE in the last 168 hours. No results for input(s): AMMONIA in the last 168 hours. Coagulation Profile: Recent Labs  Lab 05/03/21 1256  INR 1.1    Cardiac Enzymes: No results for input(s): CKTOTAL, CKMB, CKMBINDEX, TROPONINI in the last 168 hours. BNP (last 3 results) No results for input(s): PROBNP in the last 8760 hours. HbA1C: No results for input(s): HGBA1C in the last 72 hours. CBG: No results for input(s): GLUCAP in the last 168 hours. Lipid Profile: No results  for input(s): CHOL, HDL, LDLCALC, TRIG, CHOLHDL, LDLDIRECT in the last 72 hours. Thyroid Function Tests: No results for input(s): TSH, T4TOTAL, FREET4, T3FREE, THYROIDAB in the last 72 hours. Anemia Panel: No results for input(s): VITAMINB12, FOLATE, FERRITIN, TIBC, IRON, RETICCTPCT in the last 72 hours. Sepsis Labs: No results for input(s): PROCALCITON, LATICACIDVEN in the last 168 hours.  Recent Results (from the past 240 hour(s))  Resp Panel by RT-PCR (Flu A&B, Covid) Nasopharyngeal Swab     Status: None   Collection Time: 05/02/2021  2:55 PM   Specimen: Nasopharyngeal Swab; Nasopharyngeal(NP) swabs in vial transport medium  Result Value Ref Range Status   SARS Coronavirus 2 by RT PCR NEGATIVE NEGATIVE Final     Comment: (NOTE) SARS-CoV-2 target nucleic acids are NOT DETECTED.  The SARS-CoV-2 RNA is generally detectable in upper respiratory specimens during the acute phase of infection. The lowest concentration of SARS-CoV-2 viral copies this assay can detect is 138 copies/mL. A negative result does not preclude SARS-Cov-2 infection and should not be used as the sole basis for treatment or other patient management decisions. A negative result may occur with  improper specimen collection/handling, submission of specimen other than nasopharyngeal swab, presence of viral mutation(s) within the areas targeted by this assay, and inadequate number of viral copies(<138 copies/mL). A negative result must be combined with clinical observations, patient history, and epidemiological information. The expected result is Negative.  Fact Sheet for Patients:  EntrepreneurPulse.com.au  Fact Sheet for Healthcare Providers:  IncredibleEmployment.be  This test is no t yet approved or cleared by the [place] FDA and  has been authorized for detection and/or diagnosis of SARS-CoV-2 by FDA under an Emergency Use Authorization (EUA). This EUA will remain  in effect (meaning this test can be used) for the duration of the COVID-19 declaration under Section 564(b)(1) of the Act, 21 U.S.C.section 360bbb-3(b)(1), unless the authorization is terminated  or revoked sooner.       Influenza A by PCR NEGATIVE NEGATIVE Final   Influenza B by PCR NEGATIVE NEGATIVE Final    Comment: (NOTE) The Xpert Xpress SARS-CoV-2/FLU/RSV plus assay is intended as an aid in the diagnosis of influenza from Nasopharyngeal swab specimens and should not be used as a sole basis for treatment. Nasal washings and aspirates are unacceptable for Xpert Xpress SARS-CoV-2/FLU/RSV testing.  Fact Sheet for Patients: EntrepreneurPulse.com.au  Fact Sheet for Healthcare  Providers: IncredibleEmployment.be  This test is not yet approved or cleared by the [place] FDA and has been authorized for detection and/or diagnosis of SARS-CoV-2 by FDA under an Emergency Use Authorization (EUA). This EUA will remain in effect (meaning this test can be used) for the duration of the COVID-19 declaration under Section 564(b)(1) of the Act, 21 U.S.C. section 360bbb-3(b)(1), unless the authorization is terminated or revoked.  Performed at [facility], [place] [address], [place], [place] [address]   Aerobic/Anaerobic Culture w Gram Stain (surgical/deep wound)     Status: None   Collection Time: 04/30/21 11:23 AM   Specimen: PATH Cytology Peritoneal fluid  Result Value Ref Range Status   Specimen Description PERITONEAL FLUID  Final   Special Requests NONE  Final   Gram Stain   Final    RARE WBC PRESENT,BOTH PMN AND MONONUCLEAR NO ORGANISMS SEEN    Culture   Final    No growth aerobically or anaerobically. Performed at [facility], [place] [address] [place], [place] [address]    Report Status 05/05/2021 FINAL  Final  Surgical PCR screen     Status: None   Collection Time: 04/30/21  8:54 PM   Specimen: Nasal Mucosa; Nasal Swab  Result Value Ref Range Status   MRSA, PCR NEGATIVE NEGATIVE Final   Staphylococcus aureus NEGATIVE NEGATIVE Final    Comment: (NOTE) The Xpert SA Assay (FDA approved for NASAL specimens in patients 24 years of age and older), is one component of a comprehensive surveillance program. It is not intended to diagnose infection nor to guide or monitor treatment. Performed at [facility], [place] [address] [place], [place] [address]           Radiology Studies: DG CHEST PORT 1 VIEW  Result Date: 05/06/2021 CLINICAL DATA:  Pneumothorax EXAM: PORTABLE CHEST 1 VIEW COMPARISON:  Chest radiograph 05/05/2021 FINDINGS: The cardiomediastinal silhouette is stable. There is a small right apical  pneumothorax, likely not significantly changed in size. Additional lines projecting over the right hemithorax likely reflect skin folds. Hyperlucency in the upper lungs again likely reflects emphysema. Patchy opacities in the lung bases likely reflects atelectasis. There is a possible small right pleural effusion. There is no acute osseous abnormality. IMPRESSION: 1. Small right apical pneumothorax, likely not significantly changed in size. 2. Bibasilar atelectasis and small right pleural effusion. Electronically Signed   By: Valetta Mole M.D.   On: 05/06/2021 11:36        Scheduled Meds:  [MAR Hold] cholecalciferol  2,000 Units Oral BID   [MAR Hold] enoxaparin (LOVENOX) injection  55 mg Subcutaneous Q24H   [MAR Hold] feeding supplement  237 mL Oral BID BM   [MAR Hold] pantoprazole (PROTONIX) IV  40 mg Intravenous Q24H   [MAR Hold] vitamin B-12  500 mcg Oral Daily   Continuous Infusions:  sodium chloride Stopped (05/07/21 1953)          Aline August, MD [facility] 05/10/2021, 8:09 AM

## 2021-05-08 NOTE — Op Note (Signed)
[facility] Patient Name: Hannah Stein Procedure Date : 05/16/2021 MRN: [id number] Attending MD: Clarene Essex , MD Date of Birth: [birth year] CSN: [id number] Age: 87 Admit Type: Outpatient Procedure:                Flexible Sigmoidoscopy Indications:              Abnormal CT of the GI tract, For therapy of colonic                            obstruction Providers:                Clarene Essex, MD, Elmer Ramp. Tilden Dome, RN, Lodema Hong                            Technician, Technician, Cletis Athens, Merchant [employer] officer Referring MD:              Medicines:                Monitored Anesthesia Care Complications:            No immediate complications. Estimated Blood Loss:     Estimated blood loss: none. Procedure:                Pre-Anesthesia Assessment:                           - Prior to the procedure, a History and Physical                            was performed, and patient medications and                            allergies were reviewed. The patient's tolerance of                            previous anesthesia was also reviewed. The risks                            and benefits of the procedure and the sedation                            options and risks were discussed with the patient.                            All questions were answered, and informed consent                            was obtained. Prior Anticoagulants: The patient has                            taken Lovenox (enoxaparin), last dose was day of                            procedure. ASA Grade Assessment: III - A patient  with severe systemic disease. After reviewing the                            risks and benefits, the patient was deemed in                            satisfactory condition to undergo the procedure.                           After obtaining informed consent, the scope was                            passed under direct vision. The [id number] ([id number])                             Olympus endoscope was introduced through the anus                            and advanced to the the sigmoid colon. With lots of                            difficulty we were able to advance through the                            strictured area which was made difficult by the                            anatomy as well as in the prep and because of the                            patient losing her IV having trouble with the pulse                            oximeter and not wanting to readvanced through the                            strictured area after biopsies we elected to go                            ahead and place the stent and the wire was advanced                            into the sigmoid under direct visualization and                            confirmed in the proper location under fluoroscopy                            and the scope was removed making sure the wire did                            not move and  then the [id number] ([id number]) Olympus                            endoscope was introduced through the and advanced                            to the strictured area. The flexible sigmoidoscopy                            was technically difficult and complex due to                            abnormal anatomy. Successful completion of the                            procedure was aided by performing the maneuvers                            documented (below) in this report. The quality of                            the bowel preparation was poor. Lots of washing and                            suctioning was done to obtain adequate                            visualization the patient tolerated the procedure                            well. Scope In: 9:31:21 AM Scope Out: 10:06:38 AM Total Procedure Duration: 0 hours 35 minutes 16 seconds  Findings:      External and internal hemorrhoids were found during retroflexion, during       perianal exam and during digital  exam. The hemorrhoids were small.      A malignant-appearing, intrinsic severe stenosis was found in the       recto-sigmoid colon and was traversed. This was stented with a 22 mm x 9       cm WallFlex stent under fluoroscopic guidance. We elected not to biopsy       it for reasons as above      The exam was otherwise without abnormality. A significant amount of       stool was seen proximal to the stricture      A few small and large-mouthed diverticula were found in the distal       sigmoid colon. Impression:               - Preparation of the colon was poor.                           - External and internal hemorrhoids.                           - Stricture in the recto-sigmoid colon. Prosthesis  placed.                           - The examination was otherwise normal.                           - Diverticulosis in the distal sigmoid colon.                           - No specimens collected. Recommendation:           - Clear liquid diet for 4 hours. If doing well may                            slowly advance diet                           - Continue present medications. Would add MiraLAX                            every day and adjust as needed for constipation                           - Return to GI clinic PRN. Will ask my partner to                            round on tomorrow                           - Telephone GI clinic if symptomatic PRN. If                            patient does well from this consider repeat                            paracentesis for cytology as well as other studies                            and possibly proceed with a IR directed biopsy if                            tissue diagnosis is needed otherwise consider                            repeat flex sig in 2 to 4 weeks and try to biopsy                            through the stent if doing well Procedure Code(s):        --- Professional ---                           [phone],  Sigmoidoscopy, flexible; with placement of                            endoscopic  stent (includes pre- and post-dilation                            and guide wire passage, when performed) Diagnosis Code(s):        --- Professional ---                           I34.742, Other intestinal obstruction unspecified                            as to partial versus complete obstruction                           K56.609, Unspecified intestinal obstruction,                            unspecified as to partial versus complete                            obstruction                           K57.30, Diverticulosis of large intestine without                            perforation or abscess without bleeding                           R93.3, Abnormal findings on diagnostic imaging of                            other parts of digestive tract CPT copyright 2019 American Medical Association. All rights reserved. The codes documented in this report are preliminary and upon coder review may  be revised to meet current compliance requirements. Clarene Essex, MD 06/02/2021 10:31:07 AM This report has been signed electronically. Number of Addenda: 0

## 2021-05-08 NOTE — Progress Notes (Signed)
Patient ID: Hannah Stein, female   DOB: January 05, 1929, 87 y.o.   MRN: [id number]    Progress Note from the Palliative Medicine Team at [facility]   Patient Name: Hannah Stein        Date: 05/16/2021 DOB: 04-06-1929  Age: 87 y.o. MRN#: [id number] Attending Physician: Aline August, MD Primary Care Physician: Kelton Pillar, MD Admit Date: 04/12/2021   Medical records reviewed    87 y.o. female   admitted on 04/19/2021 with past  medical history significant of PAF on xarelto, COPD,  who presented with abdominal pain, N/V and poor PO intake with weight loss. she also has epigastric pain since middle of October. Colonoscopy around 68-71 years of age. Report normal except for hemorrhoids.  Upon arrival to ED, she was fairly hemodynamically stable and afebrile. CT abdomen pelvis shows Abnormal circumferential wall thickening in the sigmoid colon possible mass measuring 8.2 x 3.4 cm.  Possibility of malignant neoplastic process not excluded.  2.9 cm infrarenal abdominal aortic aneurysm.  Cirrhosis of liver with large ascites.  She was admitted under hospitalist service.  Underwent paracentesis with removal of 4 L of fluid, cytology negative for malignancy.  GI consulted.  Underwent colonoscopy 05/04/2021 was found to have partially obstructive sigmoid mass.  Pathology negative for malignancy.  General surgery consulted.  Patient has now developed some obstruction features.   GI agreed to place stent, successfully placed today.   Paracentesis planned for today  Concerned for patient frailty and worsening creatine, more lethargic and weak.  Exam: - lethargic, pale -cachetic -fingers and knees mottled  This NP visited patient at the bedside as a follow up for palliative medicine needs and emotional support and at request of attending/Dr Starla Link specifically t2/2 to his concern over  her further decline  Met with daughter and son in law at bedside for continued education regarding current medical  situation and high risk to decompensate.  Family understand the seriousness, they remain hopeful for improvement but understand that anything can happen at any time.  Plan of care -limited code, no intubation -continue to treat the treatable and hope for improvement -daughter will make decisions depending on her mother's response to treatment  Ultimately daughter does not want her mother suffer   Discussed with family the importance of continued conversation with each other and the medical providers regarding overall plan of care and treatment options,  ensuring decisions are within the context of the patients values and GOCs.  Questions and concerns addressed   Discussed with Dr Starla Link and bedside RN  Total time spent on the unit was 35 minutes  This nurse practitioner informed  the family and the attending that I will be out of the hospital until Monday morning.  If the patient is still hospitalized I will follow-up at that time.  Call palliative medicine team phone # [phone] with questions or concerns.   Greater than 50% of the time was spent in counseling and coordination of care  Wadie Lessen NP  Palliative Medicine Team Team Phone # [phone] Pager [phone]

## 2021-05-08 NOTE — Procedures (Signed)
Ultrasound-guided diagnostic and therapeutic paracentesis performed yielding 2.4 liters of amber colored fluid.  Fluid was sent to lab for analysis. No immediate complications. EBL is none.

## 2021-05-08 NOTE — Anesthesia Preprocedure Evaluation (Addendum)
Anesthesia Evaluation  Patient identified by MRN, date of birth, ID band Patient awake    Reviewed: Allergy & Precautions, NPO status , Patient's Chart, lab work & pertinent test results  Airway Mallampati: II  TM Distance: >3 FB Neck ROM: Full    Dental  (+) Teeth Intact, Dental Advisory Given, Poor Dentition   Pulmonary former smoker,    Pulmonary exam normal breath sounds clear to auscultation       Cardiovascular + DVT  Normal cardiovascular exam Rhythm:Regular Rate:Normal     Neuro/Psych negative neurological ROS  negative psych ROS   GI/Hepatic (+) Cirrhosis   ascites    , Colonic obstruction due to probable mass   Endo/Other  negative endocrine ROS  Renal/GU negative Renal ROS     Musculoskeletal negative musculoskeletal ROS (+)   Abdominal   Peds  Hematology  (+) Blood dyscrasia (Xarelto), ,   Anesthesia Other Findings Day of surgery medications reviewed with the patient.  Reproductive/Obstetrics                            Anesthesia Physical Anesthesia Plan  ASA: 3  Anesthesia Plan: MAC   Post-op Pain Management:    Induction: Intravenous  PONV Risk Score and Plan: 2 and Propofol infusion and Treatment may vary due to age or medical condition  Airway Management Planned: Nasal Cannula and Natural Airway  Additional Equipment:   Intra-op Plan:   Post-operative Plan:   Informed Consent: I have reviewed the patients History and Physical, chart, labs and discussed the procedure including the risks, benefits and alternatives for the proposed anesthesia with the patient or authorized representative who has indicated his/her understanding and acceptance.     Dental advisory given  Plan Discussed with: CRNA and Anesthesiologist  Anesthesia Plan Comments:         Anesthesia Quick Evaluation

## 2021-05-08 NOTE — Progress Notes (Signed)
OT Cancellation Note  Patient Details Name: Hannah Stein MRN: [id number] DOB: 07/07/1928   Cancelled Treatment:    Reason Eval/Treat Not Completed: Patient at procedure or test/ unavailable;Patient declined, no reason specified;Other (comment) (Nursing states that patient just recently returned from PACU and recommend try again later today. OT to reattempt if time permits.) Lodema Hong, [place]  Pager [phone] Office [place] 05/21/2021, 11:55 AM

## 2021-05-08 NOTE — Care Management Important Message (Signed)
Important Message  Patient Details  Name: Hannah Stein MRN: [id number] Date of Birth: November 15, 1928   Medicare Important Message Given:  Yes     Shelda Altes 05/13/2021, 9:43 AM

## 2021-05-08 NOTE — Progress Notes (Addendum)
Patient arrived back to 4e19 patient placed on monitor and vital signs obtained. Patient with IV in right neck as per report from PACU patient also with left arm bruised/ discolored and swollen. Left arm elevated on pillow. Dr. Starla Link in room seeing patient also at this time. Call bell with in reach. Vernor Monnig, Bettina Gavia RN

## 2021-05-09 ENCOUNTER — Inpatient Hospital Stay (HOSPITAL_COMMUNITY): Payer: Medicare Other

## 2021-05-09 DIAGNOSIS — K652 Spontaneous bacterial peritonitis: Secondary | ICD-10-CM

## 2021-05-09 DIAGNOSIS — N179 Acute kidney failure, unspecified: Secondary | ICD-10-CM | POA: Diagnosis not present

## 2021-05-09 DIAGNOSIS — R188 Other ascites: Secondary | ICD-10-CM | POA: Diagnosis not present

## 2021-05-09 DIAGNOSIS — K6389 Other specified diseases of intestine: Secondary | ICD-10-CM | POA: Diagnosis not present

## 2021-05-09 DIAGNOSIS — R627 Adult failure to thrive: Secondary | ICD-10-CM | POA: Diagnosis not present

## 2021-05-09 DIAGNOSIS — D72829 Elevated white blood cell count, unspecified: Secondary | ICD-10-CM

## 2021-05-09 LAB — CBC WITH DIFFERENTIAL/PLATELET
Abs Immature Granulocytes: 0.12 10*3/uL — ABNORMAL HIGH (ref 0.00–0.07)
Basophils Absolute: 0 10*3/uL (ref 0.0–0.1)
Basophils Relative: 0 %
Eosinophils Absolute: 0 10*3/uL (ref 0.0–0.5)
Eosinophils Relative: 0 %
HCT: 37.9 % (ref 36.0–46.0)
Hemoglobin: 12.1 g/dL (ref 12.0–15.0)
Immature Granulocytes: 1 %
Lymphocytes Relative: 3 %
Lymphs Abs: 0.5 10*3/uL — ABNORMAL LOW (ref 0.7–4.0)
MCH: 27.6 pg (ref 26.0–34.0)
MCHC: 31.9 g/dL (ref 30.0–36.0)
MCV: 86.5 fL (ref 80.0–100.0)
Monocytes Absolute: 1.3 10*3/uL — ABNORMAL HIGH (ref 0.1–1.0)
Monocytes Relative: 8 %
Neutro Abs: 14.3 10*3/uL — ABNORMAL HIGH (ref 1.7–7.7)
Neutrophils Relative %: 88 %
Platelets: 249 10*3/uL (ref 150–400)
RBC: 4.38 MIL/uL (ref 3.87–5.11)
RDW: 15.2 % (ref 11.5–15.5)
WBC: 16.2 10*3/uL — ABNORMAL HIGH (ref 4.0–10.5)
nRBC: 0.1 % (ref 0.0–0.2)

## 2021-05-09 LAB — COMPREHENSIVE METABOLIC PANEL
ALT: 11 U/L (ref 0–44)
AST: 17 U/L (ref 15–41)
Albumin: 2.2 g/dL — ABNORMAL LOW (ref 3.5–5.0)
Alkaline Phosphatase: 83 U/L (ref 38–126)
Anion gap: 10 (ref 5–15)
BUN: 83 mg/dL — ABNORMAL HIGH (ref 8–23)
CO2: 16 mmol/L — ABNORMAL LOW (ref 22–32)
Calcium: 7.7 mg/dL — ABNORMAL LOW (ref 8.9–10.3)
Chloride: 121 mmol/L — ABNORMAL HIGH (ref 98–111)
Creatinine, Ser: 2.99 mg/dL — ABNORMAL HIGH (ref 0.44–1.00)
GFR, Estimated: 14 mL/min — ABNORMAL LOW (ref >60)
Glucose, Bld: 149 mg/dL — ABNORMAL HIGH (ref 70–99)
Potassium: 4.4 mmol/L (ref 3.5–5.1)
Sodium: 147 mmol/L — ABNORMAL HIGH (ref 135–145)
Total Bilirubin: 1.4 mg/dL — ABNORMAL HIGH (ref 0.3–1.2)
Total Protein: 4.4 g/dL — ABNORMAL LOW (ref 6.5–8.1)

## 2021-05-09 LAB — MAGNESIUM: Magnesium: 2.4 mg/dL (ref 1.7–2.4)

## 2021-05-09 MED ORDER — MORPHINE 100MG IN NS 100ML (1MG/ML) PREMIX INFUSION
1.0000 mg/h | INTRAVENOUS | Status: DC
  Administered 2021-05-09: 1 mg/h via INTRAVENOUS
  Filled 2021-05-09: qty 100

## 2021-05-09 MED ORDER — HALOPERIDOL LACTATE 2 MG/ML PO CONC
0.5000 mg | ORAL | Status: DC | PRN
  Filled 2021-05-09: qty 0.3

## 2021-05-09 MED ORDER — LORAZEPAM 2 MG/ML IJ SOLN: 1.0000 mg | INTRAMUSCULAR | Status: DC | PRN

## 2021-05-09 MED ORDER — GLYCOPYRROLATE 0.2 MG/ML IJ SOLN: 0.2000 mg | INTRAMUSCULAR | Status: DC | PRN

## 2021-05-09 MED ORDER — SODIUM CHLORIDE 0.9 % IV SOLN
2.0000 g | Freq: Every day | INTRAVENOUS | Status: DC
  Administered 2021-05-09: 2 g via INTRAVENOUS
  Filled 2021-05-09: qty 20

## 2021-05-09 MED ORDER — HALOPERIDOL LACTATE 5 MG/ML IJ SOLN: 0.5000 mg | INTRAMUSCULAR | Status: DC | PRN

## 2021-05-09 MED ORDER — GLYCOPYRROLATE 1 MG PO TABS
1.0000 mg | ORAL_TABLET | ORAL | Status: DC | PRN
  Filled 2021-05-09: qty 1

## 2021-05-09 MED ORDER — LORAZEPAM 2 MG/ML PO CONC: 1.0000 mg | ORAL | Status: DC | PRN

## 2021-05-09 MED ORDER — LORAZEPAM 1 MG PO TABS: 1.0000 mg | ORAL_TABLET | ORAL | Status: DC | PRN

## 2021-05-09 MED ORDER — HALOPERIDOL 0.5 MG PO TABS
0.5000 mg | ORAL_TABLET | ORAL | Status: DC | PRN
  Filled 2021-05-09: qty 1

## 2021-05-09 MED ORDER — SODIUM BICARBONATE 8.4 % IV SOLN
INTRAVENOUS | Status: DC
  Filled 2021-05-09: qty 1000

## 2021-05-09 NOTE — Progress Notes (Signed)
Subjective: The patient was seen and examined at bedside in presence of her daughter and son-in-law. She denies any abdominal pain. She is able to respond, (follows commands such as sticking her tongue out and nodding to questions), however appears very frail and weak.  Objective: Vital signs in last 24 hours: Temp:  [96.7 F (35.9 C)-97.7 F (36.5 C)] 97.1 F (36.2 C) (12/03 1045) Pulse Rate:  [80-92] 80 (12/03 1045) Resp:  [14-23] 14 (12/03 1045) BP: (90-126)/(43-73) 90/52 (12/03 1045) SpO2:  [90 %-100 %] 96 % (12/03 1045) Weight:  [59 kg] 59 kg (12/03 0400) Weight change:  Last BM Date: 05/03/21  PE: Elderly, cachectic GENERAL: No obvious pallor, no icterus, on oxygen via nasal cannula  ABDOMEN: Slightly distended, nontender, normoactive bowel sounds EXTREMITIES: Bipedal pitting edema  Lab Results: Results for orders placed or performed during the hospital encounter of 04/25/2021 (from the past 48 hour(s))  Comprehensive metabolic panel     Status: Abnormal   Collection Time: 05/17/2021  3:03 AM  Result Value Ref Range   Sodium 150 (H) 135 - 145 mmol/L   Potassium 4.1 3.5 - 5.1 mmol/L   Chloride 122 (H) 98 - 111 mmol/L   CO2 15 (L) 22 - 32 mmol/L   Glucose, Bld 87 70 - 99 mg/dL    Comment: Glucose reference range applies only to samples taken after fasting for at least 8 hours.   BUN 68 (H) 8 - 23 mg/dL   Creatinine, Ser 2.57 (H) 0.44 - 1.00 mg/dL   Calcium 7.8 (L) 8.9 - 10.3 mg/dL   Total Protein 5.0 (L) 6.5 - 8.1 g/dL   Albumin 2.5 (L) 3.5 - 5.0 g/dL   AST 16 15 - 41 U/L   ALT 15 0 - 44 U/L   Alkaline Phosphatase 97 38 - 126 U/L   Total Bilirubin 1.6 (H) 0.3 - 1.2 mg/dL   GFR, Estimated 17 (L) >60 mL/min    Comment: (NOTE) Calculated using the CKD-EPI Creatinine Equation (2021)    Anion gap 13 5 - 15    Comment: Performed at [facility], [place] [address] [place], [place] [address]  Magnesium     Status: None   Collection Time: 05/27/2021  3:03 AM  Result Value  Ref Range   Magnesium 2.2 1.7 - 2.4 mg/dL    Comment: Performed at [facility] [address] [place], [place] [address]  CBC     Status: Abnormal   Collection Time: 05/15/2021  4:58 AM  Result Value Ref Range   WBC 23.9 (H) 4.0 - 10.5 K/uL   RBC 4.77 3.87 - 5.11 MIL/uL   Hemoglobin 13.1 12.0 - 15.0 g/dL   HCT 42.0 36.0 - 46.0 %   MCV 88.1 80.0 - 100.0 fL   MCH 27.5 26.0 - 34.0 pg   MCHC 31.2 30.0 - 36.0 g/dL   RDW 14.9 11.5 - 15.5 %   Platelets 316 150 - 400 K/uL   nRBC 0.0 0.0 - 0.2 %    Comment: Performed at [facility], [place] [address], [place], [place] [address]  Body fluid cell count with differential     Status: Abnormal   Collection Time: 06/01/2021  3:49 PM  Result Value Ref Range   Fluid Type-FCT ABDOMEN    Color, Fluid ORANGE (A) YELLOW   Appearance, Fluid CLOUDY (A) CLEAR   Total Nucleated Cell Count, Fluid 3,125 (H) 0 - 1,000 cu mm    Comment:  COUNT MAY BE INACCURATE DUE TO FIBRIN CLUMPS.   Neutrophil Count, Fluid 88 (H) 0 - 25 %   Lymphs, Fluid 3 %   Monocyte-Macrophage-Serous Fluid 9 (L) 50 - 90 %   Eos, Fluid 0 %   Other Cells, Fluid ONE MESOTHELIAL CELL SEEN %    Comment: Performed at [facility], [place] [address], [place], [place] [address]  Gram stain     Status: None   Collection Time: 05/13/2021  3:49 PM   Specimen: Abdomen; Peritoneal Fluid  Result Value Ref Range   Specimen Description FLUID PERITONEAL ABDOMEN    Special Requests NONE    Gram Stain      WBC PRESENT,BOTH PMN AND MONONUCLEAR GRAM NEGATIVE RODS GRAM POSITIVE COCCI CYTOSPIN SMEAR Performed at [facility], [place] [address], [place], [place] [address]    Report Status 06/02/2021 FINAL   Albumin, pleural or peritoneal fluid      Status: None   Collection Time: 05/25/2021  3:49 PM  Result Value Ref Range   Albumin, Fluid <1.5 g/dL   Fluid Type-FALB ABDOMEN     Comment: Performed at [facility], [place] [address], [place], [place] [address]  Protein, pleural or  peritoneal fluid     Status: None   Collection Time: 05/26/2021  3:49 PM  Result Value Ref Range   Total protein, fluid <3.0 g/dL   Fluid Type-FTP ABDOMEN     Comment: Performed at [facility], [place] [address] [place], [place] [address]  Culture, body fluid w Gram Stain-bottle     Status: None (Preliminary result)   Collection Time: 05/09/2021  3:49 PM   Specimen: Fluid  Result Value Ref Range   Specimen Description FLUID PERITONEAL ABDOMEN    Special Requests BOTTLES DRAWN AEROBIC AND ANAEROBIC 10CC    Gram Stain      GRAM NEGATIVE RODS GRAM POSITIVE RODS IN BOTH AEROBIC AND ANAEROBIC BOTTLES Performed at [facility], [place] [address], [place], [place] [address]    Report Status PENDING   Low molecular wgt heparin (fractionated)     Status: None   Collection Time: 06/04/2021  8:40 PM  Result Value Ref Range   Heparin LMW 0.54 IU/mL    Comment:        THERAPEUTIC RANGE: DVT,PE,ACS on LMWH 1 mg/kg q12 at 4 hrs = 0.5-1.2 units/mL. DVT,PE on LMWH 1.5 mg/kg q24 at 4 hrs = 1-2 units/mL. Performed at [facility], [place] [address], [place], [place] [address]   CBC with Differential/Platelet     Status: Abnormal   Collection Time: 05/09/21  1:50 AM  Result Value Ref Range   WBC 16.2 (H) 4.0 - 10.5 K/uL   RBC 4.38 3.87 - 5.11 MIL/uL   Hemoglobin 12.1 12.0 - 15.0 g/dL   HCT 37.9 36.0 - 46.0 %   MCV 86.5 80.0 - 100.0 fL   MCH 27.6 26.0 - 34.0 pg   MCHC 31.9 30.0 - 36.0 g/dL   RDW 15.2 11.5 - 15.5 %   Platelets 249 150 - 400 K/uL   nRBC 0.1 0.0 - 0.2 %   Neutrophils Relative % 88 %   Neutro Abs 14.3 (H) 1.7 - 7.7 K/uL   Lymphocytes Relative 3 %   Lymphs Abs 0.5 (L) 0.7 - 4.0 K/uL   Monocytes Relative 8 %   Monocytes Absolute 1.3 (H) 0.1 - 1.0 K/uL   Eosinophils Relative 0 %   Eosinophils  Absolute 0.0 0.0 - 0.5 K/uL   Basophils Relative 0 %   Basophils Absolute 0.0 0.0 - 0.1 K/uL   Immature Granulocytes 1 %   Abs Immature Granulocytes 0.12 (H) 0.00 - 0.07 K/uL     Comment: Performed at [facility], [place] [address], [place], [place] [address]  Comprehensive metabolic panel     Status: Abnormal   Collection Time: 05/09/21  1:50 AM  Result Value Ref Range   Sodium 147 (H) 135 - 145 mmol/L   Potassium 4.4 3.5 - 5.1 mmol/L    Comment: SLIGHT HEMOLYSIS   Chloride 121 (H) 98 - 111 mmol/L   CO2 16 (L) 22 - 32 mmol/L   Glucose, Bld 149 (H) 70 - 99 mg/dL    Comment: Glucose reference range applies only to samples taken after fasting for at least 8 hours.   BUN 83 (H) 8 - 23 mg/dL   Creatinine, Ser 2.99 (H) 0.44 - 1.00 mg/dL   Calcium 7.7 (L) 8.9 - 10.3 mg/dL   Total Protein 4.4 (L) 6.5 - 8.1 g/dL   Albumin 2.2 (L) 3.5 - 5.0 g/dL   AST 17 15 - 41 U/L   ALT 11 0 - 44 U/L   Alkaline Phosphatase 83 38 - 126 U/L   Total Bilirubin 1.4 (H) 0.3 - 1.2 mg/dL   GFR, Estimated 14 (L) >60 mL/min    Comment: (NOTE) Calculated using the CKD-EPI Creatinine Equation (2021)    Anion gap 10 5 - 15    Comment: Performed at [facility], [place] [address] [place], [place] [address]  Magnesium     Status: None   Collection Time: 05/09/21  1:50 AM  Result Value Ref Range   Magnesium 2.4 1.7 - 2.4 mg/dL    Comment: Performed at [facility] [address], [place], [place] [address]    Studies/Results: DG Abd 1 View  Result Date: 05/25/2021 CLINICAL DATA:  Flexeril sigmoidoscopy and colonic stent placement for colonic obstruction. EXAM: ABDOMEN - 1 VIEW COMPARISON:  Radiographs 05/04/2021.  CT 04/07/2021. FINDINGS: C-arm fluoroscopy provided. 1 minute and 31 seconds of fluoroscopy time. A single spot fluoroscopic image of the pelvis is submitted, limited by obliquity. This demonstrates a metallic stent positioned nearly horizontally with smooth waisting. The exact location of the stent is uncertain based on this single image. IMPRESSION: Metallic stent noted in the pelvis with smooth waisting, exact location uncertain. Electronically Signed   By: Richardean Sale M.D.   On: 05/27/2021 13:16   DG C-Arm 1-60 Min-No Report  Result Date: 05/27/2021 Fluoroscopy was utilized by the requesting physician.  No radiographic interpretation.   IR Paracentesis  Result Date: 05/25/2021 INDICATION: Patient with abdominal distension found to have ascites. Request for diagnostic and therapeutic paracentesis maximum of 4 L. EXAM: ULTRASOUND GUIDED DIAGNOSTIC AND THERAPEUTIC PARACENTESIS MEDICATIONS: Lidocaine 1% 10 mL COMPLICATIONS: None immediate. PROCEDURE: Informed written consent was obtained from the patient after a discussion of the risks, benefits and alternatives to treatment. A timeout was performed prior to the initiation of the procedure. Initial ultrasound scanning demonstrates a small amount of ascites within the left lower abdominal quadrant. The left lower abdomen was prepped and draped in the usual sterile fashion. 1% lidocaine was used for local anesthesia. Following this, a 19 gauge, 7-cm, Yueh catheter was introduced. An ultrasound image was saved for documentation purposes. The paracentesis was performed. The catheter was removed and a dressing was applied. The patient tolerated  the procedure well without immediate post procedural complication. Patient received post-procedure intravenous albumin; see nursing notes for details. FINDINGS: A total of approximately 2.4 L of amber colored fluid was removed. Samples were sent to the laboratory as requested by the clinical team. IMPRESSION: Successful ultrasound-guided diagnostic and therapeutic paracentesis yielding 2.4 liters of peritoneal fluid. Read by: Rushie Nyhan, NP Electronically Signed   By: Markus Daft M.D.   On: 05/24/2021 18:20    Medications: I have reviewed the patient's current medications.  Assessment: Sigmoid mass, 8.2 x 3.4 cm suspicious for malignancy, colonic stent placed yesterday (Unable to obtain biopsy due to significant looping and difficulty with placement of scope) CEA  elevated 7.7  Cirrhosis noted on CT, ascites, status post 2.4 L paracentesis yesterday SBP-WBC 3125 with 88% neutrophils: On IV ceftriaxone, received IV albumin yesterday Ascitic fluid albumin less than 1.5, total protein less than 3 Serum albumin 2.5, SAAG at least equal or more than 1.1 compatible with portal hypertension  Renal failure, BUN 83, creatinine 2.99, GFR 14, acidotic, bicarb 16   Plan: Extremely poor prognosis Patient is 89, frail with renal failure and 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 She understands and wants the patient's CODE STATUS to be changed to DNR, also wants to talk with palliative nurse regarding options for hospice care. Discussed the same with the patient's hospitalist Dr.Alekh. Please call us back if needed.  Ronnette Juniper, MD 05/09/2021, 11:29 AM

## 2021-05-09 NOTE — Progress Notes (Signed)
Patient ID: Hannah Stein, female   DOB: 02-16-1929, 87 y.o.   MRN: [id number]  PROGRESS NOTE    Hannah Stein  XBM:[id number] DOB: [birth year] DOA: 04/25/2021 PCP: Kelton Pillar, MD   Brief Narrative:  87 y.o. female with medical history significant of PAF on xarelto, COPD,  who presented with abdominal pain, nausea and vomiting with poor oral intake and weight loss.  On presentation, CT of the abdomen and pelvis showed abnormal circumferential wall thickening in the sigmoid colon possible mass measuring 8.2 x 3.4 cm.  Along with 2.9 cm infrarenal abdominal arctic aneurysm; cirrhosis of liver with large ascites.  She underwent paracentesis with removal of 4 L of fluid; cytology was negative for malignancy.  GI consulted: Underwent colonoscopy on 04/28/2021: Was found to have partially obstructive sigmoid mass; pathology negative for malignancy.  General surgery subsequently consulted as well.  General surgery recommended palliative stenting by GI.  She underwent flexible sigmoidoscopy and stenting on 05/18/2021 by GI.  Assessment & Plan:   Failure to thrive in adult/generalized weakness -Likely secondary to colon mass (? Malignancy) and cirrhosis of liver -Palliative care evaluation appreciated.  CODE STATUS has been changed to partial code on 05/18/2021 by palliative care team.  Patient's overall prognosis is very poor. -PT/OT recommending SNF placement.  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  Surgery would be a last resort with high risk of morbidity and mortality. -Colonoscopy had revealed sigmoid mass: Pathology was negative for malignancy.  She underwent flexible sigmoidoscopy and stenting on 05/17/2021 by GI.  Small right-sided  pneumothorax -Prior hospitalist curb sided with Dr. Olalere/PCCM who had recommended to keep patient on oxygen to keep oxygen saturation 100%. -Chest x-ray on 05/06/2021 had shown small pneumothorax essentially unchanged  Cirrhosis of liver with large ascites Possible SBP -Status post diagnostic and therapeutic paracentesis with removal of 4 L of fluid on 04/30/2021.  Cytology negative for malignancy. -Currently not on diuretics because of AKI. -Underwent repeat paracentesis on 05/15/2021 with peritoneal fluid analysis suspicious for SBP and gram stain showing gram-negative rods and gram-positive rods.  Follow identification sensitivities.  Start Rocephin.  AKI on CKD stage IIIa Acute metabolic acidosis -Baseline creatinine around 1.1.  Presented with creatinine of 1.61.  Creatinine worsening to 2.99 today. -Initially treated with IV fluids, subsequently discontinued and received few doses of IV albumin as well. -Patient is probably going to hepatorenal syndrome.  Urine output is poor.  Monitor creatinine.  Patient is a very poor candidate for dialysis -Start bicarb drip.  Repeat a.m. labs.  Hypernatremia -IV fluids plan as above.  Monitor.  Severe malnutrition -Follow nutrition recommendations with  Hyperkalemia -Resolved  Paroxysmal A. Fib -Currently rate controlled.  Cardizem stopped due to junctional rhythm. -Xarelto on hold until plans from GI/general surgery have been finalized.  Continue Lovenox for now.  Elevated troponin -Possibly from demand ischemia.  Troponins did not significantly trend up.  Echo showed EF of 60 to 65% with no regional wall motion abnormalities -No further work-up needed  Leukocytosis -Improving.  Antibiotic plan as above.  History of hypertension -Blood pressure currently stable.  Not on any antihypertensive currently  Infrarenal abdominal aortic aneurysm -2.9 cm.  Outpatient follow-up  Goals of care -As discussed above.  Palliative care team  following. -I think that patient's condition  is very poor and she should consider total comfort measures/hospice.   DVT prophylaxis: SCDs; Lovenox Code Status: Partial Family Communication: None at bedside Disposition Plan: Status is: Inpatient  Remains inpatient appropriate because: Patient is clinically very ill with cirrhosis of liver with ascites/sigmoid mass/worsening kidney function  Consultants: GI/general surgery/palliative care  Procedures: Colonoscopy/paracentesis  Antimicrobials: None   Subjective: Patient seen and examined at bedside.  Extremely poor historian.  No fever, chest pain, agitation reported.  Objective: Vitals:   05/22/2021 2000 05/09/21 0000 05/09/21 0400 05/09/21 0745  BP: (!) 115/43 (!) 125/50 (!) 111/48 (!) 110/48  Pulse: 84 88 82 82  Resp: 14 16 16 16   Temp: 97.7 F (36.5 C) (!) 97.4 F (36.3 C) (!) 97.2 F (36.2 C) (!) 96.7 F (35.9 C)  TempSrc: Axillary Axillary Axillary Axillary  SpO2: 99% 98% 97% 97%  Weight:   59 kg   Height:        Intake/Output Summary (Last 24 hours) at 05/09/2021 0757 Last data filed at 05/09/2021 2353 Gross per 24 hour  Intake 1202.66 ml  Output 305 ml  Net 897.66 ml    Filed Weights   05/05/21 0337 05/10/2021 0748 05/09/21 0400  Weight: 54.6 kg 54.6 kg 59 kg    Examination:  General exam: Looks chronically ill and deconditioned.  No acute distress.  Still on 2 L oxygen via nasal cannula.   Respiratory system: Decreased breath sounds at bases bilaterally with some scattered crackles  cardiovascular system: S1-S2 heard; rate controlled  gastrointestinal system: Abdomen is distended slightly, soft and nontender.  Bowel sounds are heard  extremities: Bilateral lower extremity edema present with left arm and forearm swelling is well.  No clubbing  Central nervous system: Extremely poor historian; wakes up only slightly.  No focal neurological deficits.  Moving extremities  skin: Ecchymosis present on left  arm psychiatry: Flat affect  Data Reviewed: I have personally reviewed following labs and imaging studies  CBC: Recent Labs  Lab 05/03/21 0132 05/04/21 0036 05/05/21 0202 05/23/2021 0458 05/09/21 0150  WBC 10.5 16.3* 13.9* 23.9* 16.2*  NEUTROABS  --   --   --   --  14.3*  HGB 13.9 16.5* 15.8* 13.1 12.1  HCT 43.1 52.7* 50.1* 42.0 37.9  MCV 86.9 88.0 87.0 88.1 86.5  PLT 258 415* 357 316 614    Basic Metabolic Panel: Recent Labs  Lab 05/04/21 0036 05/04/21 1327 05/05/21 0202 05/06/21 0116 06/03/2021 0303 05/09/21 0150  NA 138  --  144 144 150* 147*  K 5.2* 4.5 4.5 4.1 4.1 4.4  CL 108  --  114* 117* 122* 121*  CO2 18*  --  19* 19* 15* 16*  GLUCOSE 134*  --  113* 96 87 149*  BUN 38*  --  48* 55* 68* 83*  CREATININE 1.50*  --  2.14* 2.17* 2.57* 2.99*  CALCIUM 8.6*  --  8.7* 8.0* 7.8* 7.7*  MG  --   --   --   --  2.2 2.4    GFR: Estimated Creatinine Clearance: 10.3 mL/min (A) (by C-G formula based on SCr of 2.99 mg/dL (H)). Liver Function Tests: Recent Labs  Lab 05/21/2021 0303 05/09/21 0150  AST 16 17  ALT 15 11  ALKPHOS 97 83  BILITOT 1.6* 1.4*  PROT 5.0* 4.4*  ALBUMIN 2.5* 2.2*    No results for input(s): LIPASE, AMYLASE in the last 168 hours. No results for input(s): AMMONIA in the last 168 hours. Coagulation Profile: Recent Labs  Lab 05/03/21 1256  INR 1.1    Cardiac Enzymes: No results for input(s): CKTOTAL, CKMB, CKMBINDEX, TROPONINI in the last 168 hours. BNP (last 3 results) No results for input(s): PROBNP in the last 8760 hours. HbA1C: No results for input(s): HGBA1C in the last 72 hours. CBG: No results for input(s): GLUCAP in the last 168 hours. Lipid Profile: No results for input(s): CHOL, HDL, LDLCALC, TRIG, CHOLHDL, LDLDIRECT in the last 72 hours. Thyroid Function Tests: No results for input(s): TSH, T4TOTAL, FREET4, T3FREE, THYROIDAB in the last 72 hours. Anemia Panel: No results for input(s): VITAMINB12, FOLATE, FERRITIN, TIBC, IRON,  RETICCTPCT in the last 72 hours. Sepsis Labs: No results for input(s): PROCALCITON, LATICACIDVEN in the last 168 hours.  Recent Results (from the past 240 hour(s))  Resp Panel by RT-PCR (Flu A&B, Covid) Nasopharyngeal Swab     Status: None   Collection Time: 04/19/2021  2:55 PM   Specimen: Nasopharyngeal Swab; Nasopharyngeal(NP) swabs in vial transport medium  Result Value Ref Range Status   SARS Coronavirus 2 by RT PCR NEGATIVE NEGATIVE Final    Comment: (NOTE) SARS-CoV-2 target nucleic acids are NOT DETECTED.  The SARS-CoV-2 RNA is generally detectable in upper respiratory specimens during the acute phase of infection. The lowest concentration of SARS-CoV-2 viral copies this assay can detect is 138 copies/mL. A negative result does not preclude SARS-Cov-2 infection and should not be used as the sole basis for treatment or other patient management decisions. A negative result may occur with  improper specimen collection/handling, submission of specimen other than nasopharyngeal swab, presence of viral mutation(s) within the areas targeted by this assay, and inadequate number of viral copies(<138 copies/mL). A negative result must be combined with clinical observations, patient history, and epidemiological information. The expected result is Negative.  Fact Sheet for Patients:  EntrepreneurPulse.com.au  Fact Sheet for Healthcare Providers:  IncredibleEmployment.be  This test is no t yet approved or cleared by the [place] FDA and  has been authorized for detection and/or diagnosis of SARS-CoV-2 by FDA under an Emergency Use Authorization (EUA). This EUA will remain  in effect (meaning this test can be used) for the duration of the COVID-19 declaration under Section 564(b)(1) of the Act, 21 U.S.C.section 360bbb-3(b)(1), unless the authorization is terminated  or revoked sooner.       Influenza A by PCR NEGATIVE NEGATIVE Final   Influenza  B by PCR NEGATIVE NEGATIVE Final    Comment: (NOTE) The Xpert Xpress SARS-CoV-2/FLU/RSV plus assay is intended as an aid in the diagnosis of influenza from Nasopharyngeal swab specimens and should not be used as a sole basis for treatment. Nasal washings and aspirates are unacceptable for Xpert Xpress SARS-CoV-2/FLU/RSV testing.  Fact Sheet for Patients: EntrepreneurPulse.com.au  Fact Sheet for Healthcare Providers: IncredibleEmployment.be  This test is not yet approved or cleared by the [place] FDA and has been authorized for detection and/or diagnosis of SARS-CoV-2 by FDA under an Emergency Use Authorization (EUA). This EUA will remain in effect (meaning this test can be used) for the duration of the COVID-19 declaration under Section 564(b)(1) of the Act, 21 U.S.C. section 360bbb-3(b)(1), unless the authorization is terminated or revoked.  Performed at [facility], [place] [address] [place], [place] [address]   Aerobic/Anaerobic Culture w Gram Stain (surgical/deep wound)     Status: None   Collection Time: 04/30/21 11:23 AM   Specimen: PATH Cytology Peritoneal fluid  Result Value Ref Range Status   Specimen Description PERITONEAL FLUID  Final   Special Requests NONE  Final   Gram Stain   Final    RARE WBC PRESENT,BOTH PMN AND MONONUCLEAR NO ORGANISMS SEEN    Culture   Final    No growth aerobically or anaerobically. Performed at [facility], [place] [address], [place], [place] [address]    Report Status 05/05/2021 FINAL  Final  Surgical PCR screen     Status: None   Collection Time: 04/30/21  8:54 PM   Specimen: Nasal Mucosa; Nasal Swab  Result Value Ref Range Status   MRSA, PCR NEGATIVE NEGATIVE Final   Staphylococcus aureus NEGATIVE NEGATIVE Final    Comment: (NOTE) The Xpert SA Assay (FDA approved for NASAL specimens in patients 65 years of age and older), is one component of a comprehensive surveillance program.  It is not intended to diagnose infection nor to guide or monitor treatment. Performed at [facility], [place] [address] [place], [place] [address]   Gram stain     Status: None   Collection Time: 05/20/2021  3:49 PM   Specimen: Abdomen; Peritoneal Fluid  Result Value Ref Range Status   Specimen Description FLUID PERITONEAL ABDOMEN  Final   Special Requests NONE  Final   Gram Stain   Final    WBC PRESENT,BOTH PMN AND MONONUCLEAR GRAM NEGATIVE RODS GRAM POSITIVE COCCI CYTOSPIN SMEAR Performed at [facility], [place] [address], [place], [place] [address]    Report Status 06/01/2021 FINAL  Final  Culture, body fluid w Gram Stain-bottle     Status: None (Preliminary result)   Collection Time: 05/22/2021  3:49 PM   Specimen: Fluid  Result Value Ref Range Status   Specimen Description FLUID PERITONEAL ABDOMEN  Final   Special Requests BOTTLES DRAWN AEROBIC AND ANAEROBIC 10CC  Final   Gram Stain   Final    GRAM NEGATIVE RODS GRAM POSITIVE RODS IN BOTH AEROBIC AND ANAEROBIC BOTTLES Performed at [facility], [place] [address], [place], [place] [address]    Report Status PENDING  Incomplete          Radiology Studies: DG Abd 1 View  Result Date: 05/29/2021 CLINICAL DATA:  Flexeril sigmoidoscopy and colonic stent placement for colonic obstruction. EXAM: ABDOMEN - 1 VIEW COMPARISON:  Radiographs 05/04/2021.  CT 05/02/2021. FINDINGS: C-arm fluoroscopy provided. 1 minute and 31 seconds of fluoroscopy time. A single spot fluoroscopic image of the pelvis is submitted, limited by obliquity. This demonstrates a metallic stent positioned nearly horizontally with smooth waisting. The exact location of the stent is uncertain based on this single image. IMPRESSION: Metallic stent noted in the pelvis with smooth waisting, exact location uncertain. Electronically Signed   By: Richardean Sale M.D.   On: 05/16/2021 13:16   DG C-Arm 1-60 Min-No Report  Result Date: 05/22/2021 Fluoroscopy  was utilized by the requesting physician.  No radiographic interpretation.   IR Paracentesis  Result Date: 05/23/2021 INDICATION: Patient with abdominal distension found to have ascites. Request for diagnostic and therapeutic paracentesis maximum of 4 L. EXAM: ULTRASOUND GUIDED DIAGNOSTIC AND THERAPEUTIC PARACENTESIS MEDICATIONS: Lidocaine 1% 10 mL COMPLICATIONS: None immediate. PROCEDURE: Informed written consent was obtained from the patient after a discussion of the risks, benefits and alternatives to treatment. A timeout was performed prior to the initiation of the procedure. Initial ultrasound scanning demonstrates a small amount of ascites within the left lower abdominal quadrant. The left lower abdomen was prepped and draped in the usual sterile fashion. 1% lidocaine was  used for local anesthesia. Following this, a 19 gauge, 7-cm, Yueh catheter was introduced. An ultrasound image was saved for documentation purposes. The paracentesis was performed. The catheter was removed and a dressing was applied. The patient tolerated the procedure well without immediate post procedural complication. Patient received post-procedure intravenous albumin; see nursing notes for details. FINDINGS: A total of approximately 2.4 L of amber colored fluid was removed. Samples were sent to the laboratory as requested by the clinical team. IMPRESSION: Successful ultrasound-guided diagnostic and therapeutic paracentesis yielding 2.4 liters of peritoneal fluid. Read by: Rushie Nyhan, NP Electronically Signed   By: Markus Daft M.D.   On: 05/28/2021 18:20        Scheduled Meds:  cholecalciferol  2,000 Units Oral BID   enoxaparin (LOVENOX) injection  60 mg Subcutaneous Q24H   feeding supplement  237 mL Oral BID BM   pantoprazole (PROTONIX) IV  40 mg Intravenous Q24H   polyethylene glycol  17 g Oral Daily   vitamin B-12  500 mcg Oral Daily   Continuous Infusions:  dextrose 75 mL/hr at 05/09/21 0358           Aline August, MD [facility] 05/09/2021, 7:57 AM

## 2021-05-09 NOTE — Progress Notes (Signed)
SLP Note  Patient Details Name: Hannah Stein MRN: [id number] DOB: 12/28/1928   Orders for clinical swallowing evaluation were received.  Per chart review patient was admitted due to abdominal pain, decreased PO intake and weight loss. She was diagnosed with FTT, possible sigmoid colon mass, ascites and AKI superimposed on CKD.  D/W attending and patient is likely to become comfort measures only and he did not want swallowing evaluation at this time.  If we can be of further assistance please feel free to reconsult.       Shelly Flatten, MA, [facility] SLP [phone]  Lamar Sprinkles 05/09/2021, 11:56 AM

## 2021-05-09 NOTE — Progress Notes (Signed)
General Surgery  Flex sig with placement of colonic stent performed yesterday. Patient has been having bowel movements. Patient appears very weak this morning, only able to nod yes or no to questions. Per notes, discussions are ongoing regarding transition to comfort measures. General surgery will sign off at this time, please call with any further questions or concerns.  Michaelle Birks, [facility], Hepatobiliary and Pancreatic Surgery 05/09/21 4:30 PM

## 2021-05-10 DIAGNOSIS — Z515 Encounter for palliative care: Secondary | ICD-10-CM | POA: Diagnosis not present

## 2021-05-10 DIAGNOSIS — R627 Adult failure to thrive: Secondary | ICD-10-CM | POA: Diagnosis not present

## 2021-05-10 NOTE — Progress Notes (Signed)
Received Hannah Stein care from Jenean Lindau, patient's daughter and son in - law in the room. Hannah Stein is in comfort measures, not responsive. Patient's oxygen saturation is at 89-90% on 2l and morphine drip is running at 21ml  per hour. Patient look comfortable in no distress. We'll continue to monitor.

## 2021-05-10 NOTE — Progress Notes (Signed)
Patient ID: Hannah Stein, female   DOB: June 13, 1928, 87 y.o.   MRN: [id number]  PROGRESS NOTE    Hannah Stein  GDJ:[id number] DOB: [birth year] DOA: 05/03/2021 PCP: Kelton Pillar, MD   Brief Narrative:  87 y.o. female with medical history significant of PAF on xarelto, COPD,  who presented with abdominal pain, nausea and vomiting with poor oral intake and weight loss.  On presentation, CT of the abdomen and pelvis showed abnormal circumferential wall thickening in the sigmoid colon possible mass measuring 8.2 x 3.4 cm.  Along with 2.9 cm infrarenal abdominal arctic aneurysm; cirrhosis of liver with large ascites.  She underwent paracentesis with removal of 4 L of fluid; cytology was negative for malignancy.  GI consulted: Underwent colonoscopy on 04/12/2021: Was found to have partially obstructive sigmoid mass; pathology negative for malignancy.  General surgery subsequently consulted as well.  General surgery recommended palliative stenting by GI.  She underwent flexible sigmoidoscopy and stenting on 06/02/2021 by GI.  Her condition did not improve with progressive worsening renal function and repeat paracentesis was consistent with SBP, antibiotics were started.  She had very minimal urine output.  After discussion with family, family has agreed to pursue comfort measures.  She was started on morphine drip on 05/09/2021.  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 Fib Elevated troponin Leukocytosis History of hypertension Infrarenal abdominal aortic aneurysm  Plan -As described above, comfort measures with morphine drip will be continued.   DVT prophylaxis: None for comfort  measures  code Status: DNR Family Communication: Daughter and son-in-law on 05/09/2021 disposition Plan: Status is: Inpatient  Remains inpatient appropriate because: Patient is clinically very ill and currently on morphine drip  Expect in-hospital death.  Consultants: GI/general surgery/palliative care  Procedures: Colonoscopy/paracentesis  Antimicrobials:  Anti-infectives (From admission, onward)    Start     Dose/Rate Route Frequency Ordered Stop   05/09/21 0830  cefTRIAXone (ROCEPHIN) 2 g in sodium chloride 0.9 % 100 mL IVPB  Status:  Discontinued        2 g 200 mL/hr over 30 Minutes Intravenous Daily 05/09/21 0803 05/09/21 1208        Subjective: Patient seen and examined at bedside.  Unresponsive but looks comfortable. Objective: Vitals:   05/09/21 0400 05/09/21 0745 05/09/21 1045 05/09/21 1926  BP: (!) 111/48 (!) 110/48 (!) 90/52 (!) 116/53  Pulse: 82 82 80 85  Resp: 16 16 14 20   Temp: (!) 97.2 F (36.2 C) (!) 96.7 F (35.9 C) (!) 97.1 F (36.2 C) (!) 97 F (36.1 C)  TempSrc: Axillary Axillary Axillary Axillary  SpO2: 97% 97% 96% 96%  Weight: 59 kg     Height:        Intake/Output Summary (Last 24 hours) at 05/10/2021 0726 Last data filed at 05/10/2021 0300 Gross per 24 hour  Intake 12.43 ml  Output 100 ml  Net -87.57 ml    Filed Weights   05/05/21 0337 06/06/2021 0748 05/09/21 0400  Weight: 54.6 kg 54.6 kg 59 kg    Examination:  General exam: Looks unresponsive but comfortable.  Currently on 1 to 2 L oxygen via nasal cannula.  Data Reviewed: I have personally reviewed following labs and imaging studies  CBC: Recent Labs  Lab 05/04/21  0036 05/05/21 0202 05/27/2021 0458 05/09/21 0150  WBC 16.3* 13.9* 23.9* 16.2*  NEUTROABS  --   --   --  14.3*  HGB 16.5* 15.8* 13.1 12.1  HCT 52.7* 50.1* 42.0 37.9  MCV 88.0 87.0 88.1 86.5  PLT 415* 357 316 284    Basic Metabolic Panel: Recent Labs  Lab 05/04/21 0036 05/04/21 1327 05/05/21 0202  05/06/21 0116 05/14/2021 0303 05/09/21 0150  NA 138  --  144 144 150* 147*  K 5.2* 4.5 4.5 4.1 4.1 4.4  CL 108  --  114* 117* 122* 121*  CO2 18*  --  19* 19* 15* 16*  GLUCOSE 134*  --  113* 96 87 149*  BUN 38*  --  48* 55* 68* 83*  CREATININE 1.50*  --  2.14* 2.17* 2.57* 2.99*  CALCIUM 8.6*  --  8.7* 8.0* 7.8* 7.7*  MG  --   --   --   --  2.2 2.4    GFR: Estimated Creatinine Clearance: 10.3 mL/min (A) (by C-G formula based on SCr of 2.99 mg/dL (H)). Liver Function Tests: Recent Labs  Lab 06/06/2021 0303 05/09/21 0150  AST 16 17  ALT 15 11  ALKPHOS 97 83  BILITOT 1.6* 1.4*  PROT 5.0* 4.4*  ALBUMIN 2.5* 2.2*    No results for input(s): LIPASE, AMYLASE in the last 168 hours. No results for input(s): AMMONIA in the last 168 hours. Coagulation Profile: Recent Labs  Lab 05/03/21 1256  INR 1.1    Cardiac Enzymes: No results for input(s): CKTOTAL, CKMB, CKMBINDEX, TROPONINI in the last 168 hours. BNP (last 3 results) No results for input(s): PROBNP in the last 8760 hours. HbA1C: No results for input(s): HGBA1C in the last 72 hours. CBG: No results for input(s): GLUCAP in the last 168 hours. Lipid Profile: No results for input(s): CHOL, HDL, LDLCALC, TRIG, CHOLHDL, LDLDIRECT in the last 72 hours. Thyroid Function Tests: No results for input(s): TSH, T4TOTAL, FREET4, T3FREE, THYROIDAB in the last 72 hours. Anemia Panel: No results for input(s): VITAMINB12, FOLATE, FERRITIN, TIBC, IRON, RETICCTPCT in the last 72 hours. Sepsis Labs: No results for input(s): PROCALCITON, LATICACIDVEN in the last 168 hours.  Recent Results (from the past 240 hour(s))  Aerobic/Anaerobic Culture w Gram Stain (surgical/deep wound)     Status: None   Collection Time: 04/30/21 11:23 AM   Specimen: PATH Cytology Peritoneal fluid  Result Value Ref Range Status   Specimen Description PERITONEAL FLUID  Final   Special Requests NONE  Final   Gram Stain   Final    RARE WBC PRESENT,BOTH PMN AND  MONONUCLEAR NO ORGANISMS SEEN    Culture   Final    No growth aerobically or anaerobically. Performed at [facility], [place] [address], [place], [place] [address]    Report Status 05/05/2021 FINAL  Final  Surgical PCR screen     Status: None   Collection Time: 04/30/21  8:54 PM   Specimen: Nasal Mucosa; Nasal Swab  Result Value Ref Range Status   MRSA, PCR NEGATIVE NEGATIVE Final   Staphylococcus aureus NEGATIVE NEGATIVE Final    Comment: (NOTE) The Xpert SA Assay (FDA approved for NASAL specimens in patients 81 years of age and older), is one component of a comprehensive surveillance program. It is not intended to diagnose infection nor to guide or monitor treatment. Performed at [facility], [place] [address] [place],  [address]   Gram stain     Status: None  Collection Time: 05/19/2021  3:49 PM   Specimen: Abdomen; Peritoneal Fluid  Result Value Ref Range Status   Specimen Description FLUID PERITONEAL ABDOMEN  Final   Special Requests NONE  Final   Gram Stain   Final    WBC PRESENT,BOTH PMN AND MONONUCLEAR GRAM NEGATIVE RODS GRAM POSITIVE COCCI CYTOSPIN SMEAR Performed at [facility], [place] [address] [place], [place] [address]    Report Status 05/23/2021 FINAL  Final  Culture, body fluid w Gram Stain-bottle     Status: None (Preliminary result)   Collection Time: 05/16/2021  3:49 PM   Specimen: Fluid  Result Value Ref Range Status   Specimen Description FLUID PERITONEAL ABDOMEN  Final   Special Requests BOTTLES DRAWN AEROBIC AND ANAEROBIC 10CC  Final   Gram Stain   Final    GRAM NEGATIVE RODS GRAM POSITIVE RODS IN BOTH AEROBIC AND ANAEROBIC BOTTLES Performed at [facility], [place] [address] [place], [place] [address]    Report Status PENDING  Incomplete          Radiology Studies: DG Abd 1 View  Result Date: 05/15/2021 CLINICAL DATA:  Flexeril sigmoidoscopy and colonic stent placement for colonic obstruction. EXAM: ABDOMEN - 1  VIEW COMPARISON:  Radiographs 05/04/2021.  CT 05/04/2021. FINDINGS: C-arm fluoroscopy provided. 1 minute and 31 seconds of fluoroscopy time. A single spot fluoroscopic image of the pelvis is submitted, limited by obliquity. This demonstrates a metallic stent positioned nearly horizontally with smooth waisting. The exact location of the stent is uncertain based on this single image. IMPRESSION: Metallic stent noted in the pelvis with smooth waisting, exact location uncertain. Electronically Signed   By: Richardean Sale M.D.   On: 06/01/2021 13:16   DG C-Arm 1-60 Min-No Report  Result Date: 05/13/2021 Fluoroscopy was utilized by the requesting physician.  No radiographic interpretation.   IR Paracentesis  Result Date: 05/23/2021 INDICATION: Patient with abdominal distension found to have ascites. Request for diagnostic and therapeutic paracentesis maximum of 4 L. EXAM: ULTRASOUND GUIDED DIAGNOSTIC AND THERAPEUTIC PARACENTESIS MEDICATIONS: Lidocaine 1% 10 mL COMPLICATIONS: None immediate. PROCEDURE: Informed written consent was obtained from the patient after a discussion of the risks, benefits and alternatives to treatment. A timeout was performed prior to the initiation of the procedure. Initial ultrasound scanning demonstrates a small amount of ascites within the left lower abdominal quadrant. The left lower abdomen was prepped and draped in the usual sterile fashion. 1% lidocaine was used for local anesthesia. Following this, a 19 gauge, 7-cm, Yueh catheter was introduced. An ultrasound image was saved for documentation purposes. The paracentesis was performed. The catheter was removed and a dressing was applied. The patient tolerated the procedure well without immediate post procedural complication. Patient received post-procedure intravenous albumin; see nursing notes for details. FINDINGS: A total of approximately 2.4 L of amber colored fluid was removed. Samples were sent to the laboratory as requested  by the clinical team. IMPRESSION: Successful ultrasound-guided diagnostic and therapeutic paracentesis yielding 2.4 liters of peritoneal fluid. Read by: Rushie Nyhan, NP Electronically Signed   By: Markus Daft M.D.   On: 06/01/2021 18:20        Scheduled Meds:   Continuous Infusions:  morphine 1 mg/hr (05/09/21 1423)          Aline August, MD [facility] 05/10/2021, 7:26 AM

## 2021-05-11 ENCOUNTER — Encounter (HOSPITAL_COMMUNITY): Payer: Self-pay | Admitting: Gastroenterology

## 2021-05-11 DIAGNOSIS — E43 Unspecified severe protein-calorie malnutrition: Secondary | ICD-10-CM | POA: Insufficient documentation

## 2021-05-11 LAB — CYTOLOGY - NON PAP

## 2021-05-11 LAB — PATHOLOGIST SMEAR REVIEW

## 2021-05-12 LAB — CULTURE, BODY FLUID W GRAM STAIN -BOTTLE

## 2021-05-22 ENCOUNTER — Ambulatory Visit (HOSPITAL_BASED_OUTPATIENT_CLINIC_OR_DEPARTMENT_OTHER): Payer: Medicare Other | Admitting: Cardiology

## 2021-06-07 NOTE — Death Summary Note (Signed)
Death Summary  Hannah Stein ZOX:[id number] DOB: [birth year] DOA: 02-May-2021  PCP: Kelton Pillar, MD  Admit date: 2021/05/02 Date of Death: 05/14/2021 Time of Death: 61   History of present illness:  88 y.o. female with medical history significant of PAF on xarelto, COPD,  who presented with abdominal pain, nausea and vomiting with poor oral intake and weight loss.  On presentation, CT of the abdomen and pelvis showed abnormal circumferential wall thickening in the sigmoid colon possible mass measuring 8.2 x 3.4 cm.  Along with 2.9 cm infrarenal abdominal arctic aneurysm; cirrhosis of liver with large ascites.  She underwent paracentesis with removal of 4 L of fluid; cytology was negative for malignancy.  GI consulted: Underwent colonoscopy on 05/02/2021: Was found to have partially obstructive sigmoid mass; pathology negative for malignancy.  General surgery subsequently consulted as well.  General surgery recommended palliative stenting by GI.  She underwent flexible sigmoidoscopy and stenting on 05/10/2021 by GI.  Her condition did not improve with progressive worsening renal function and repeat paracentesis was consistent with SBP, antibiotics were started.  She had very minimal urine output.  After discussion with family, family has agreed to pursue comfort measures.  She was started on morphine drip on 05/09/2021.  She expired on May 14, 2021 at 0554 and family was notified.  Final Diagnoses:   Comfort measures only Failure to thrive in adult/generalized weakness Sigmoid colon mass with possible cancer Partial small bowel obstruction Small right-sided pneumothorax Cirrhosis of liver with large ascites Possible SBP AKI on CKD stage IIIa Acute metabolic acidosis Hypernatremia Severe malnutrition Hyperkalemia Paroxysmal A. Fib Elevated troponin Leukocytosis History of hypertension Infrarenal abdominal aortic aneurysm    The results of significant diagnostics from this  hospitalization (including imaging, microbiology, ancillary and laboratory) are listed below for reference.    Significant Diagnostic Studies: DG Abd 1 View  Result Date: 05/29/2021 CLINICAL DATA:  Flexeril sigmoidoscopy and colonic stent placement for colonic obstruction. EXAM: ABDOMEN - 1 VIEW COMPARISON:  Radiographs 05/04/2021.  CT 2021/05/02. FINDINGS: C-arm fluoroscopy provided. 1 minute and 31 seconds of fluoroscopy time. A single spot fluoroscopic image of the pelvis is submitted, limited by obliquity. This demonstrates a metallic stent positioned nearly horizontally with smooth waisting. The exact location of the stent is uncertain based on this single image. IMPRESSION: Metallic stent noted in the pelvis with smooth waisting, exact location uncertain. Electronically Signed   By: Richardean Sale M.D.   On: 05/25/2021 13:16   DG Abd 1 View  Result Date: 05/04/2021 CLINICAL DATA:  Clinical diagnosis of small-bowel obstruction. EXAM: ABDOMEN - 1 VIEW COMPARISON:  Abdomen and pelvis CT dated 02-May-2021 FINDINGS: Centralized bowel loops compatible with previously demonstrated large volume ascites. Mildly dilated loops of small bowel in the left mid abdomen. Gas in normal caliber rectum. No gross free peritoneal air. Lumbar spine degenerative changes. IMPRESSION: 1. Mild partial small bowel obstruction or ileus. 2. Recently demonstrated ascites. Electronically Signed   By: Claudie Revering M.D.   On: 05/04/2021 09:55   CT CHEST WO CONTRAST  Result Date: 05/02/2021 CLINICAL DATA:  Colorectal cancer, staging EXAM: CT CHEST WITHOUT CONTRAST TECHNIQUE: Multidetector CT imaging of the chest was performed following the standard protocol without IV contrast. COMPARISON:  CT abdomen 05/02/2021 FINDINGS: Cardiovascular: Heart size normal. Small pericardial effusion. Dilated central pulmonary arteries suggesting pulmonary hypertension. 3-vessel coronary calcifications. Atheromatous thoracic aorta without  aneurysm. Mediastinum/Nodes: Small hiatal hernia. No definite mass or adenopathy, sensitivity decreased without IV contrast. Heterogenous mildly enlarged right thyroid  lobe; In the setting of significant comorbidities or limited life expectancy, no follow-up recommended (ref: J Am Coll Radiol. 2015 Feb;12(2): 143-50). Lungs/Pleura: Small pleural effusions right greater than left, without evidence of pleural nodularity or loculation. Small right pneumothorax estimated less than 10%, which was not evident on the prior study. Pulmonary emphysema. Pleural based calcifications and scarring in the posterior right lung apex. Bandlike scarring or atelectasis in the anteroinferior right middle lobe. Dependent atelectasis/consolidation posteriorly at the right lung base. Upper Abdomen: Abdominal ascites. Musculoskeletal: Anterior vertebral endplate spurring at multiple levels in the lower thoracic spine. IMPRESSION: 1. Small right pneumothorax, new since previous. Critical Value/emergent results were called by telephone at the time of interpretation on 05/02/2021 at 10:17 am to provider [facility] , who verbally acknowledged these results. 2. Small pleural effusions right greater than left 3.  Emphysema (ICD10-J43.9). 4. Abdominal ascites 5. Coronary and Aortic Atherosclerosis (ICD10-170.0). Electronically Signed   By: Lucrezia Europe M.D.   On: 05/02/2021 10:18   CT ABDOMEN PELVIS W CONTRAST  Result Date: 05/03/2021 CLINICAL DATA:  Abdominal pain EXAM: CT ABDOMEN AND PELVIS WITH CONTRAST TECHNIQUE: Multidetector CT imaging of the abdomen and pelvis was performed using the standard protocol following bolus administration of intravenous contrast. CONTRAST:  49mL OMNIPAQUE IOHEXOL 300 MG/ML  SOLN COMPARISON:  None. FINDINGS: Lower chest: Centrilobular and panlobular emphysema is seen. There is small pericardial effusion. Small bilateral pleural effusions are seen, more so on the right side. There is fluid in the lumen of  lower thoracic esophagus suggesting gastroesophageal reflux. Hepatobiliary: Liver measures 10.1 cm in length. There is nodularity in the liver surface suggesting cirrhosis. There is no dilation of bile ducts. There is mild wall thickening in gallbladder which may be due to chronic cholecystitis or related to ascites. Pancreas: No focal abnormality is seen Spleen: Spleen measures 10.2 cm in maximum diameter. No focal abnormality is seen. Adrenals/Urinary Tract: Adrenals are unremarkable. There is no hydronephrosis. There is contrast in the pelvocaliceal systems in the kidneys limiting evaluation for small nonobstructing stones. There is no hydronephrosis. There is 3.7 cm fluid density structure in the midportion of left kidney suggesting renal cyst. Stomach/Bowel: Small hiatal hernia is seen. Stomach is not distended. Small bowel loops are not dilated. Appendix is not distinctly seen. There is no focal pericecal inflammation. Scattered diverticula are seen in the colon without definite signs of focal diverticulitis. There is abnormal wall thickening in the sigmoid colon. There is possible circumferential mass in the sigmoid colon rmeasuring approximately 8.2 x 3.4 cm. Vascular/Lymphatic: Extensive atherosclerotic plaques and calcifications are seen in the abdominal aorta and its major branches. There is 2.9 cm infrarenal aortic aneurysm. There is abnormal thickening of omentum. Reproductive: Unremarkable Other: Large ascites is present.  There is no pneumoperitoneum. Musculoskeletal: Degenerative changes are noted in the lumbar spine more so at L3-L4 and L4-L5 levels. IMPRESSION: There is no evidence of intestinal obstruction or pneumoperitoneum. There is no hydronephrosis. There is abnormal circumferential wall thickening in the sigmoid colon. Possibility of a malignant neoplastic process is not excluded. Endoscopy as clinically warranted should be considered. 2.9 cm infrarenal abdominal aortic aneurysm.  Diverticulosis of colon without signs of focal acute diverticulitis. Cirrhosis of liver. Large ascites. There is abnormal thickening of omentum which may be due to chronic inflammation or neoplastic process. Small hiatal hernia. Possible gastroesophageal reflux. Small pericardial effusion. Small bilateral pleural effusions. COPD. Other findings as described in the body of the report. Electronically Signed   By: Royston Cowper  Rathinasamy M.D.   On: 04/11/2021 16:41   US Paracentesis  Result Date: 04/30/2021 INDICATION: Abdominal distention. Ascites. Request for diagnostic and therapeutic paracentesis. EXAM: ULTRASOUND GUIDED RIGHT LOWER QUADRANT PARACENTESIS MEDICATIONS: 1% plain lidocaine, 5 mL COMPLICATIONS: None immediate. PROCEDURE: Informed written consent was obtained from the patient after a discussion of the risks, benefits and alternatives to treatment. A timeout was performed prior to the initiation of the procedure. Initial ultrasound scanning demonstrates a large amount of ascites within the right lower abdominal quadrant. The right lower abdomen was prepped and draped in the usual sterile fashion. 1% lidocaine was used for local anesthesia. Following this, a 19 gauge, 7-cm, Yueh catheter was introduced. An ultrasound image was saved for documentation purposes. The paracentesis was performed. The catheter was removed and a dressing was applied. The patient tolerated the procedure well without immediate post procedural complication. FINDINGS: A total of approximately 4 L of clear, dark yellow fluid was removed. Samples were sent to the laboratory as requested by the clinical team. IMPRESSION: Successful ultrasound-guided paracentesis yielding 4 liters of peritoneal fluid. Read by: Ascencion Dike PA-C Electronically Signed   By: Miachel Roux M.D.   On: 04/30/2021 11:26   DG CHEST PORT 1 VIEW  Result Date: 05/06/2021 CLINICAL DATA:  Pneumothorax EXAM: PORTABLE CHEST 1 VIEW COMPARISON:  Chest radiograph  05/05/2021 FINDINGS: The cardiomediastinal silhouette is stable. There is a small right apical pneumothorax, likely not significantly changed in size. Additional lines projecting over the right hemithorax likely reflect skin folds. Hyperlucency in the upper lungs again likely reflects emphysema. Patchy opacities in the lung bases likely reflects atelectasis. There is a possible small right pleural effusion. There is no acute osseous abnormality. IMPRESSION: 1. Small right apical pneumothorax, likely not significantly changed in size. 2. Bibasilar atelectasis and small right pleural effusion. Electronically Signed   By: Valetta Mole M.D.   On: 05/06/2021 11:36   DG CHEST PORT 1 VIEW  Result Date: 05/05/2021 CLINICAL DATA:  Pneumothorax EXAM: PORTABLE CHEST 1 VIEW COMPARISON:  05/04/2021 FINDINGS: Minimal residual right apical pneumothorax, less than 5% volume, not significantly changed compared to prior. Severe emphysema. No acute airspace opacity. Heart and mediastinum are normal. IMPRESSION: 1. Minimal residual right apical pneumothorax, less than 5% volume, not significantly changed compared to prior. 2. Severe emphysema. Electronically Signed   By: Delanna Ahmadi M.D.   On: 05/05/2021 08:10   DG CHEST PORT 1 VIEW  Result Date: 05/04/2021 CLINICAL DATA:  Evaluate pneumothorax. EXAM: PORTABLE CHEST 1 VIEW COMPARISON:  CT chest 05/02/2021. FINDINGS: Persistent right-sided pneumothorax is identified with creasing right apical component which measures 1.4 cm in maximum thickness. This is compared with 1 cm when measured off the chest CT from 05/02/2021. There is a long linear lucency which extends along the periphery of the right lung which is felt to represent skin fold artifact. The lungs are hyperinflated and there are diffuse interstitial changes compatible with COPD/emphysema. IMPRESSION: Persistent right-sided pneumothorax with mild increase in right apical component. These results will be called to the  ordering clinician or representative by the Radiologist Assistant, and communication documented in the PACS or [employer]. Electronically Signed   By: Kerby Moors M.D.   On: 05/04/2021 12:37   DG C-Arm 1-60 Min-No Report  Result Date: 05/25/2021 Fluoroscopy was utilized by the requesting physician.  No radiographic interpretation.   ECHOCARDIOGRAM COMPLETE  Result Date: 04/10/2021    ECHOCARDIOGRAM REPORT   Patient Name:   TORRENCE BRANAGAN Date of  Exam: 04/09/2021 Medical Rec #:  [id number]        Height:       63.0 in Accession #:    [id number]       Weight:       124.1 lb Date of Birth:  22-Jan-1929        BSA:          1.579 m Patient Age:    27 years         BP:           105/48 mmHg Patient Gender: F                HR:           85 bpm. Exam Location:  Inpatient Procedure: 2D Echo, Cardiac Doppler and Color Doppler Indications:    Elevated troponin  History:        Patient has prior history of Echocardiogram examinations, most                 recent 08/09/2018. Arrythmias:Atrial Fibrillation.  Sonographer:    Merrie Roof RDCS Referring Phys: [id number] [place]  1. Left ventricular ejection fraction, by estimation, is 60 to 65%. The left ventricle has normal function. The left ventricle has no regional wall motion abnormalities. Left ventricular diastolic parameters were normal.  2. Right ventricular systolic function is normal. The right ventricular size is normal.  3. No subcostal images.  4. A small pericardial effusion is present. The pericardial effusion is anterior to the right ventricle.  5. The mitral valve is normal in structure. No evidence of mitral valve regurgitation. No evidence of mitral stenosis.  6. Tricuspid valve regurgitation is moderate.  7. The aortic valve is tricuspid. Aortic valve regurgitation is not visualized. No aortic stenosis is present. FINDINGS  Left Ventricle: Left ventricular ejection fraction, by estimation, is 60 to 65%. The left ventricle has  normal function. The left ventricle has no regional wall motion abnormalities. The left ventricular internal cavity size was normal in size. There is  no left ventricular hypertrophy. Left ventricular diastolic parameters were normal. Right Ventricle: The right ventricular size is normal. No increase in right ventricular wall thickness. Right ventricular systolic function is normal. Left Atrium: Left atrial size was normal in size. Right Atrium: Right atrial size was normal in size. Pericardium: A small pericardial effusion is present. The pericardial effusion is anterior to the right ventricle. Mitral Valve: The mitral valve is normal in structure. No evidence of mitral valve regurgitation. No evidence of mitral valve stenosis. Tricuspid Valve: The tricuspid valve is normal in structure. Tricuspid valve regurgitation is moderate . No evidence of tricuspid stenosis. Aortic Valve: The aortic valve is tricuspid. Aortic valve regurgitation is not visualized. No aortic stenosis is present. Pulmonic Valve: The pulmonic valve was normal in structure. Pulmonic valve regurgitation is mild. No evidence of pulmonic stenosis. Aorta: The aortic root is normal in size and structure. Venous: The inferior vena cava was not well visualized. IAS/Shunts: The interatrial septum was not well visualized. Jenkins Rouge MD Electronically signed by Jenkins Rouge MD Signature Date/Time: 04/14/2021/1:04:10 PM    Final    [place] ASCITES (ABDOMEN LIMITED)  Result Date: 04/28/2021 CLINICAL DATA:  Assess for ascites EXAM: LIMITED ABDOMEN ULTRASOUND FOR ASCITES TECHNIQUE: Limited ultrasound survey for ascites was performed in all four abdominal quadrants. COMPARISON:  CT 04/11/2021 FINDINGS: Ultrasound evaluation of the 4 quadrants is performed. Large volume of ascites within the abdomen, greatest in the upper  quadrants. Incidental note made of a left renal cyst. IMPRESSION: Large volume of ascites. Electronically Signed   By: Donavan Foil M.D.    On: 04/07/2021 19:47   IR Paracentesis  Result Date: 05/07/2021 INDICATION: Patient with abdominal distension found to have ascites. Request for diagnostic and therapeutic paracentesis maximum of 4 L. EXAM: ULTRASOUND GUIDED DIAGNOSTIC AND THERAPEUTIC PARACENTESIS MEDICATIONS: Lidocaine 1% 10 mL COMPLICATIONS: None immediate. PROCEDURE: Informed written consent was obtained from the patient after a discussion of the risks, benefits and alternatives to treatment. A timeout was performed prior to the initiation of the procedure. Initial ultrasound scanning demonstrates a small amount of ascites within the left lower abdominal quadrant. The left lower abdomen was prepped and draped in the usual sterile fashion. 1% lidocaine was used for local anesthesia. Following this, a 19 gauge, 7-cm, Yueh catheter was introduced. An ultrasound image was saved for documentation purposes. The paracentesis was performed. The catheter was removed and a dressing was applied. The patient tolerated the procedure well without immediate post procedural complication. Patient received post-procedure intravenous albumin; see nursing notes for details. FINDINGS: A total of approximately 2.4 L of amber colored fluid was removed. Samples were sent to the laboratory as requested by the clinical team. IMPRESSION: Successful ultrasound-guided diagnostic and therapeutic paracentesis yielding 2.4 liters of peritoneal fluid. Read by: Rushie Nyhan, NP Electronically Signed   By: Markus Daft M.D.   On: 05/14/2021 18:20    Microbiology: Recent Results (from the past 240 hour(s))  Gram stain     Status: None   Collection Time: 05/12/2021  3:49 PM   Specimen: Abdomen; Peritoneal Fluid  Result Value Ref Range Status   Specimen Description FLUID PERITONEAL ABDOMEN  Final   Special Requests NONE  Final   Gram Stain   Final    WBC PRESENT,BOTH PMN AND MONONUCLEAR GRAM NEGATIVE RODS GRAM POSITIVE COCCI CYTOSPIN SMEAR Performed at [facility], [place] [address] [place], [place] [address]    Report Status 05/23/2021 FINAL  Final  Culture, body fluid w Gram Stain-bottle     Status: Abnormal (Preliminary result)   Collection Time: 05/13/2021  3:49 PM   Specimen: Fluid  Result Value Ref Range Status   Specimen Description FLUID PERITONEAL ABDOMEN  Final   Special Requests BOTTLES DRAWN AEROBIC AND ANAEROBIC 10CC  Final   Gram Stain   Final    GRAM NEGATIVE RODS GRAM POSITIVE RODS IN BOTH AEROBIC AND ANAEROBIC BOTTLES    Culture (A)  Final    ESCHERICHIA COLI CULTURE REINCUBATED FOR BETTER GROWTH Performed at [facility], [place] [address], [place], [place] [address]    Report Status PENDING  Incomplete   Organism ID, Bacteria ESCHERICHIA COLI  Final      Susceptibility   Escherichia coli - MIC*    AMPICILLIN 16 INTERMEDIATE Intermediate     CEFAZOLIN <=4 SENSITIVE Sensitive     CEFEPIME <=0.12 SENSITIVE Sensitive     CEFTAZIDIME <=1 SENSITIVE Sensitive     CEFTRIAXONE <=0.25 SENSITIVE Sensitive     CIPROFLOXACIN <=0.25 SENSITIVE Sensitive     GENTAMICIN <=1 SENSITIVE Sensitive     IMIPENEM <=0.25 SENSITIVE Sensitive     TRIMETH/SULFA <=20 SENSITIVE Sensitive     AMPICILLIN/SULBACTAM 8 SENSITIVE Sensitive     PIP/TAZO <=4 SENSITIVE Sensitive     * ESCHERICHIA COLI     Labs: Basic Metabolic Panel: Recent Labs  Lab 05/05/21 0202 05/06/21 0116 05/20/2021 0303 05/09/21 0150  NA 144  144 150* 147*  K 4.5 4.1 4.1 4.4  CL 114* 117* 122* 121*  CO2 19* 19* 15* 16*  GLUCOSE 113* 96 87 149*  BUN 48* 55* 68* 83*  CREATININE 2.14* 2.17* 2.57* 2.99*  CALCIUM 8.7* 8.0* 7.8* 7.7*  MG  --   --  2.2 2.4   Liver Function Tests: Recent Labs  Lab 05/30/2021 0303 05/09/21 0150  AST 16 17  ALT 15 11  ALKPHOS 97 83  BILITOT 1.6* 1.4*  PROT 5.0* 4.4*  ALBUMIN 2.5* 2.2*   No results for input(s): LIPASE, AMYLASE in the last 168 hours. No results for input(s): AMMONIA in the last 168 hours. CBC: Recent Labs  Lab  05/05/21 0202 05/16/2021 0458 05/09/21 0150  WBC 13.9* 23.9* 16.2*  NEUTROABS  --   --  14.3*  HGB 15.8* 13.1 12.1  HCT 50.1* 42.0 37.9  MCV 87.0 88.1 86.5  PLT 357 316 249   Cardiac Enzymes: No results for input(s): CKTOTAL, CKMB, CKMBINDEX, TROPONINI in the last 168 hours. D-Dimer No results for input(s): DDIMER in the last 72 hours. BNP: Invalid input(s): POCBNP CBG: No results for input(s): GLUCAP in the last 168 hours. Anemia work up No results for input(s): VITAMINB12, FOLATE, FERRITIN, TIBC, IRON, RETICCTPCT in the last 72 hours. Urinalysis No results found for: COLORURINE, APPEARANCEUR, LABSPEC, PHURINE, GLUCOSEU, HGBUR, BILIRUBINUR, KETONESUR, PROTEINUR, UROBILINOGEN, NITRITE, LEUKOCYTESUR Sepsis Labs Invalid input(s): PROCALCITONIN,  WBC,  LACTICIDVEN     SIGNED:  Aline August, MD  [facility] 13-May-2021, 10:02 AM

## 2021-06-07 NOTE — Progress Notes (Signed)
Called to patient's room by Dyann Ruddle patient's primary RN to evaluate patient who is unresponsive. Patient has no respirations or pulse present upon my assessment. No heart sounds or breath sounds present on auscultation. Patient status is palliative and DNR. Order present for RN to pronounce death. Time of death 50. Instructed primary RN to notify MD and family.   100 mg of IV morphine wasted with NDeye Letta Median RN in stericycle.

## 2021-06-07 NOTE — Progress Notes (Signed)
Patient unresponsive with no breath sound or pulse, Called the Charge RN to the patient's room for a second verifier. After confirmation, patient's daughter Doristine Section notified and the on call MD. Jeani Hawking is on her way to the Hospital, MD advised that day doctor will do the paperwork.   100 mg of IV morphine wasted with  Ivin Booty the Camera operator.   [place] donor notified, patient is not a candidate for organs donation. Awaiting for daughter and daughter's husband.

## 2021-06-07 NOTE — Care Management Important Message (Deleted)
Important Message  Patient Details  Name: MYLINH CRAGG MRN: [id number] Date of Birth: 1928-08-30   Medicare Important Message Given:  Yes     Shelda Altes 2021-05-30, 10:51 AM

## 2021-06-07 DEATH — deceased

## 2023-03-04 IMAGING — US IR PARACENTESIS
1 series · 5 of 5 positions shown · non-contrast
Comparison: none

INDICATION: Patient with abdominal distension found to have ascites. Request for
diagnostic and therapeutic paracentesis maximum of 4 L.

[Series 1: ir (person_name)/(person_name) · 5 of 5 slices shown]
[im 1/5]
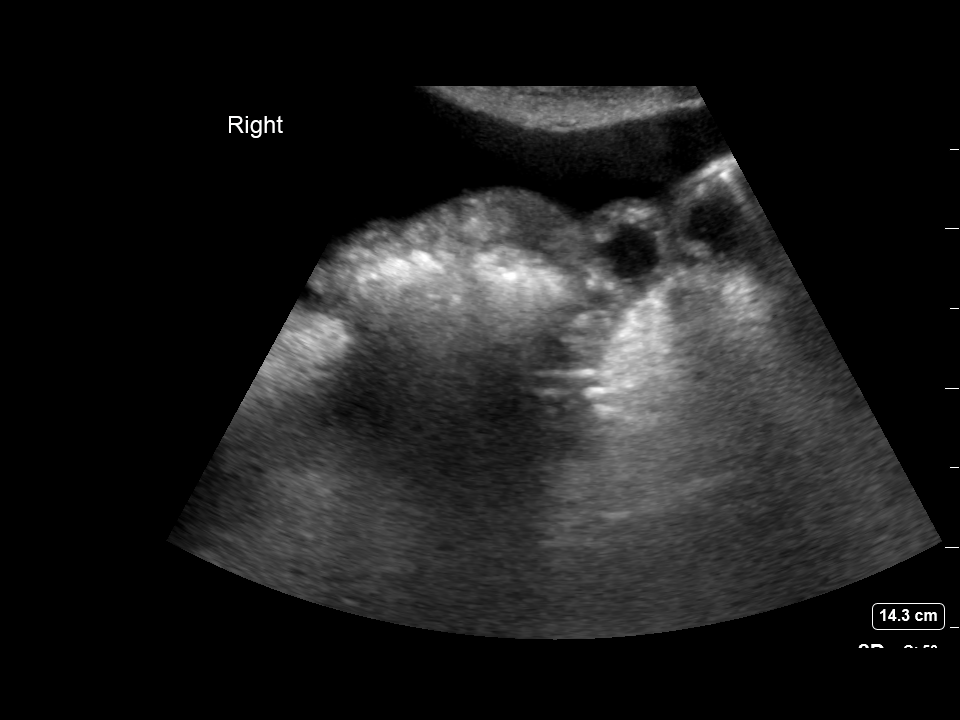
[im 2/5]
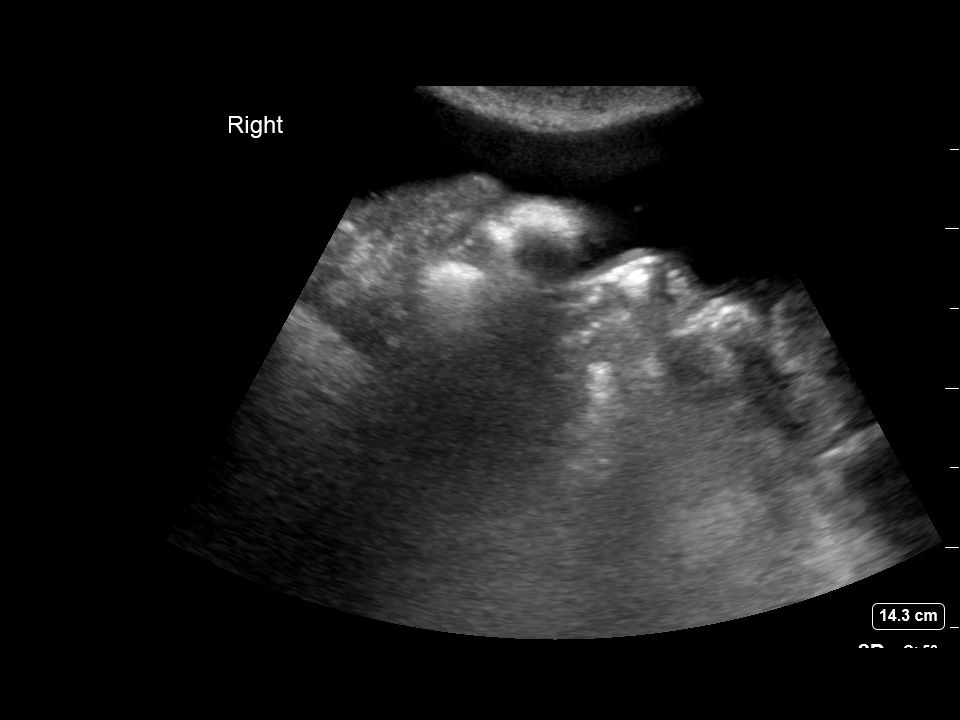
[im 3/5]
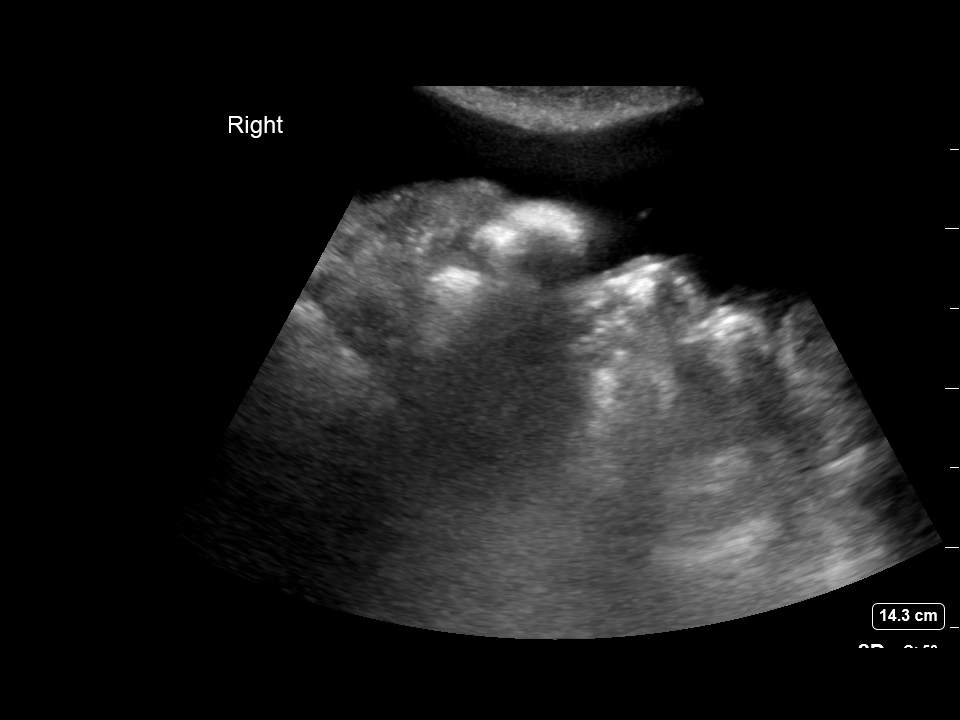
[im 4/5]
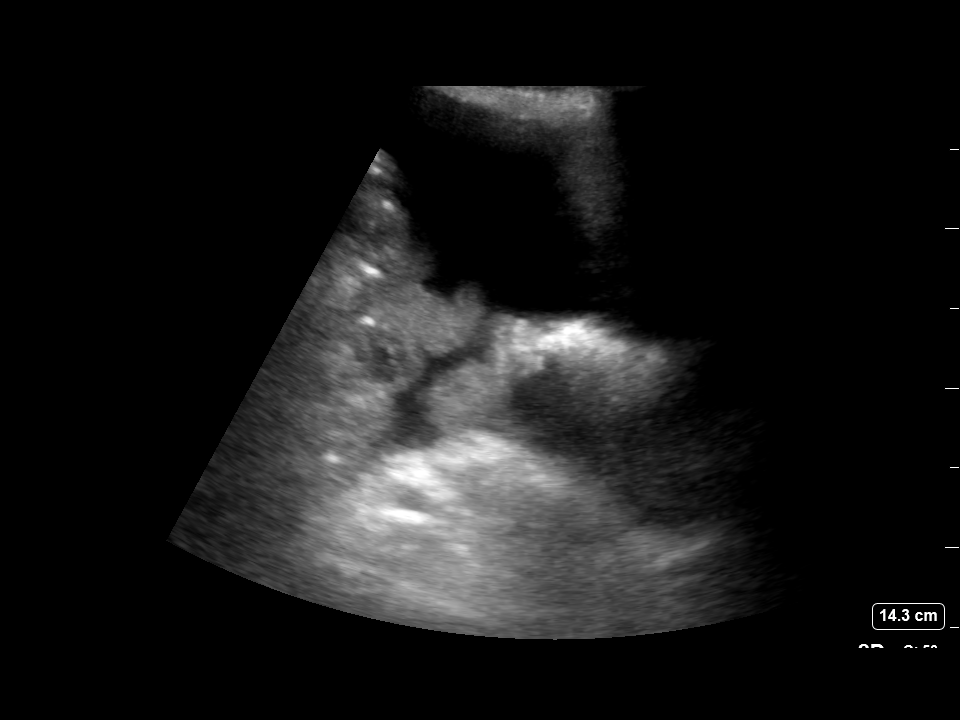
[im 5/5]
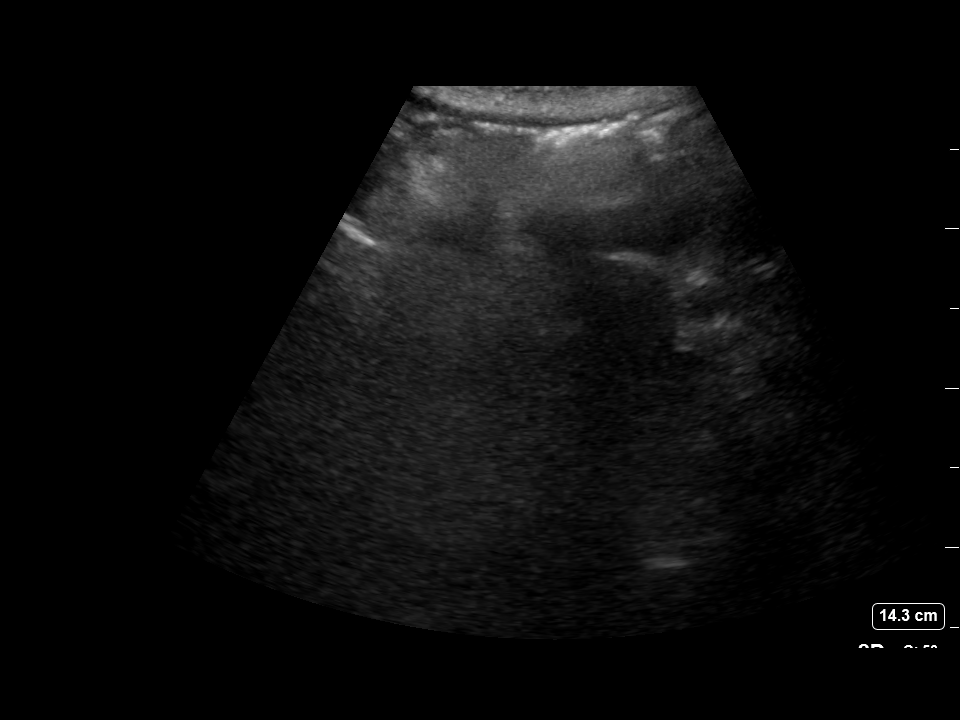

[5 of 5 positions shown; findings below may reference images not displayed]

EXAM:
ULTRASOUND GUIDED DIAGNOSTIC AND THERAPEUTIC PARACENTESIS

MEDICATIONS:
Lidocaine 1% 10 mL

COMPLICATIONS:
None immediate.

PROCEDURE:
Informed written consent was obtained from the patient after a
discussion of the risks, benefits and alternatives to treatment. A
timeout was performed prior to the initiation of the procedure.

Initial ultrasound scanning demonstrates a small amount of ascites
within the left lower abdominal quadrant. The left lower abdomen was
prepped and draped in the usual sterile fashion. 1% lidocaine was
used for local anesthesia.

Following this, a 19 gauge, 7-cm, Yueh catheter was introduced. An
ultrasound image was saved for documentation purposes. The
paracentesis was performed. The catheter was removed and a dressing
was applied. The patient tolerated the procedure well without
immediate post procedural complication.
Patient received post-procedure intravenous albumin; see nursing
notes for details.
FINDINGS: A total of approximately 2.4 L of amber colored fluid was removed.
Samples were sent to the laboratory as requested by the clinical
team.
IMPRESSION: Successful ultrasound-guided diagnostic and therapeutic paracentesis
yielding 2.4 liters of peritoneal fluid.

Read by: Paulus N Ceejay, NP
